# Patient Record
Sex: Female | Born: 1960 | Race: Black or African American | Hispanic: No | Smoking: Former smoker
Health system: Southern US, Community
[De-identification: ages and names within clinical notes are randomized; demographics above are authoritative.]

## PROBLEM LIST (undated history)

## (undated) DIAGNOSIS — Z9221 Personal history of antineoplastic chemotherapy: Secondary | ICD-10-CM

## (undated) DIAGNOSIS — R011 Cardiac murmur, unspecified: Secondary | ICD-10-CM

## (undated) DIAGNOSIS — M1990 Unspecified osteoarthritis, unspecified site: Secondary | ICD-10-CM

## (undated) DIAGNOSIS — R0600 Dyspnea, unspecified: Secondary | ICD-10-CM

## (undated) DIAGNOSIS — C50919 Malignant neoplasm of unspecified site of unspecified female breast: Secondary | ICD-10-CM

## (undated) DIAGNOSIS — G43909 Migraine, unspecified, not intractable, without status migrainosus: Secondary | ICD-10-CM

## (undated) DIAGNOSIS — Z923 Personal history of irradiation: Secondary | ICD-10-CM

## (undated) DIAGNOSIS — D649 Anemia, unspecified: Secondary | ICD-10-CM

## (undated) DIAGNOSIS — I10 Essential (primary) hypertension: Secondary | ICD-10-CM

## (undated) DIAGNOSIS — K219 Gastro-esophageal reflux disease without esophagitis: Secondary | ICD-10-CM

## (undated) DIAGNOSIS — S62509A Fracture of unspecified phalanx of unspecified thumb, initial encounter for closed fracture: Secondary | ICD-10-CM

## (undated) HISTORY — PX: PILONIDAL CYST EXCISION: SHX744

---

## 2004-07-21 HISTORY — PX: ABLATION: SHX5711

## 2006-03-19 ENCOUNTER — Ambulatory Visit: Payer: Self-pay

## 2007-04-15 ENCOUNTER — Ambulatory Visit: Payer: Self-pay | Admitting: Internal Medicine

## 2007-07-28 ENCOUNTER — Ambulatory Visit: Payer: Self-pay

## 2008-08-08 ENCOUNTER — Ambulatory Visit: Payer: Self-pay | Admitting: Family Medicine

## 2009-04-12 ENCOUNTER — Ambulatory Visit: Payer: Self-pay | Admitting: Family Medicine

## 2009-10-03 ENCOUNTER — Ambulatory Visit: Payer: Self-pay | Admitting: Family Medicine

## 2009-10-24 ENCOUNTER — Ambulatory Visit: Payer: Self-pay | Admitting: Family Medicine

## 2010-05-24 ENCOUNTER — Ambulatory Visit: Payer: Self-pay | Admitting: Oncology

## 2010-06-06 ENCOUNTER — Ambulatory Visit: Payer: Self-pay | Admitting: Oncology

## 2010-06-20 ENCOUNTER — Ambulatory Visit: Payer: Self-pay | Admitting: Oncology

## 2010-07-21 ENCOUNTER — Ambulatory Visit: Payer: Self-pay | Admitting: Oncology

## 2010-09-16 ENCOUNTER — Ambulatory Visit: Payer: Self-pay | Admitting: Family Medicine

## 2011-07-22 HISTORY — PX: FRACTURE SURGERY: SHX138

## 2013-12-15 DIAGNOSIS — I10 Essential (primary) hypertension: Secondary | ICD-10-CM | POA: Insufficient documentation

## 2013-12-15 DIAGNOSIS — F32A Depression, unspecified: Secondary | ICD-10-CM | POA: Insufficient documentation

## 2013-12-15 DIAGNOSIS — G43909 Migraine, unspecified, not intractable, without status migrainosus: Secondary | ICD-10-CM | POA: Insufficient documentation

## 2013-12-15 DIAGNOSIS — F329 Major depressive disorder, single episode, unspecified: Secondary | ICD-10-CM | POA: Insufficient documentation

## 2013-12-29 ENCOUNTER — Ambulatory Visit: Payer: Self-pay | Admitting: Family Medicine

## 2014-11-28 ENCOUNTER — Other Ambulatory Visit: Payer: Self-pay | Admitting: Family Medicine

## 2014-11-28 ENCOUNTER — Ambulatory Visit
Admission: RE | Admit: 2014-11-28 | Discharge: 2014-11-28 | Disposition: A | Discharge status: Home / Self Care | Payer: BLUE CROSS/BLUE SHIELD | Source: Ambulatory Visit | Attending: Family Medicine | Admitting: Family Medicine

## 2014-11-28 DIAGNOSIS — R1033 Periumbilical pain: Secondary | ICD-10-CM | POA: Diagnosis present

## 2014-11-28 HISTORY — DX: Essential (primary) hypertension: I10

## 2014-11-28 MED ORDER — IOHEXOL 350 MG/ML SOLN
100.0000 mL | Freq: Once | INTRAVENOUS | Status: AC | PRN
  Administered 2014-11-28: 100 mL via INTRAVENOUS

## 2016-05-04 ENCOUNTER — Emergency Department: Payer: BLUE CROSS/BLUE SHIELD

## 2016-05-04 ENCOUNTER — Emergency Department
Admission: EM | Admit: 2016-05-04 | Discharge: 2016-05-04 | Disposition: A | Discharge status: Home / Self Care | Payer: BLUE CROSS/BLUE SHIELD | Attending: Emergency Medicine | Admitting: Emergency Medicine

## 2016-05-04 DIAGNOSIS — I10 Essential (primary) hypertension: Secondary | ICD-10-CM | POA: Insufficient documentation

## 2016-05-04 DIAGNOSIS — R0789 Other chest pain: Secondary | ICD-10-CM | POA: Diagnosis present

## 2016-05-04 DIAGNOSIS — R5383 Other fatigue: Secondary | ICD-10-CM | POA: Diagnosis not present

## 2016-05-04 DIAGNOSIS — R079 Chest pain, unspecified: Secondary | ICD-10-CM

## 2016-05-04 DIAGNOSIS — F17200 Nicotine dependence, unspecified, uncomplicated: Secondary | ICD-10-CM | POA: Insufficient documentation

## 2016-05-04 HISTORY — DX: Migraine, unspecified, not intractable, without status migrainosus: G43.909

## 2016-05-04 LAB — BASIC METABOLIC PANEL
Anion gap: 7 (ref 5–15)
BUN: 11 mg/dL (ref 6–20)
CALCIUM: 9.4 mg/dL (ref 8.9–10.3)
CHLORIDE: 107 mmol/L (ref 101–111)
CO2: 27 mmol/L (ref 22–32)
CREATININE: 0.8 mg/dL (ref 0.44–1.00)
GFR calc Af Amer: >60 mL/min (ref >60)
Glucose, Bld: 102 mg/dL — ABNORMAL HIGH (ref 65–99)
Potassium: 3.7 mmol/L (ref 3.5–5.1)
SODIUM: 141 mmol/L (ref 135–145)

## 2016-05-04 LAB — CBC
HCT: 40.7 % (ref 35.0–47.0)
Hemoglobin: 13.6 g/dL (ref 12.0–16.0)
MCH: 23.2 pg — ABNORMAL LOW (ref 26.0–34.0)
MCHC: 33.5 g/dL (ref 32.0–36.0)
MCV: 69.2 fL — AB (ref 80.0–100.0)
PLATELETS: 269 10*3/uL (ref 150–440)
RBC: 5.89 MIL/uL — ABNORMAL HIGH (ref 3.80–5.20)
RDW: 15.5 % — AB (ref 11.5–14.5)
WBC: 13.2 10*3/uL — AB (ref 3.6–11.0)

## 2016-05-04 LAB — TROPONIN I: Troponin I: <0.03 ng/mL (ref <0.03)

## 2016-05-04 MED ORDER — ASPIRIN 81 MG PO CHEW
324.0000 mg | CHEWABLE_TABLET | Freq: Once | ORAL | Status: AC
  Administered 2016-05-04: 324 mg via ORAL
  Filled 2016-05-04: qty 4

## 2016-05-04 NOTE — Discharge Instructions (Signed)

## 2016-05-04 NOTE — ED Notes (Signed)
This RN to bedside to apologize for delay, pt and son at bedside at this time, state understanding. NAD noted. Explained to patient this RN would be in to D/C as soon as possible. Pt comfortable and cooperative at this time.

## 2016-05-04 NOTE — ED Provider Notes (Addendum)
[facility] Emergency Department Provider Note  ____________________________________________   First MD Initiated Contact with Patient 05/04/16 1724     (approximate)  I have reviewed the triage vital signs and the nursing notes.  HISTORY  Chief Complaint Chest Pain  HPI Destiny Rogers is a 55 y.o. female reports she hasn't been feeling very well for the last 4 days. She has been feeling just very tired, fatigued, and reports for the last few weeks she is occasionally felt a "catch" feeling in her chest. At times while at rest or sometimes with walking she'll feel like someone is giving her a hug from behind. She describes it as a mild sense of pressure, no severe pain, no radiation to the arm or jaw, not associated with nausea vomiting or sweats.  He does not take any medication to relieve this. She has a history of high blood pressure and is taking blood pressure medicine. She denies any known history of heart disease. She is a smoker. She states there is no early heart attacks or family, though her mother had palpitations and an arrhythmia, believes this developed later in life.  Denies any personal history of heart disease. Reports at present she feels well.  Past Medical History:  Diagnosis Date  . Hypertension   . Migraines     There are no active problems to display for this patient.   Past Surgical History:  Procedure Laterality Date  . CESAREAN SECTION    . FRACTURE SURGERY     left hand    Prior to Admission medications   Not on File    Allergies Penicillins  No family history on file.  Social History Social History  Substance Use Topics  . Smoking status: Current Every Day Smoker  . Smokeless tobacco: Never Used  . Alcohol use No    Review of Systems Constitutional: No fever/chills. Feeling just slightly more tired than usual. Eyes: No visual changes. ENT: No sore throat. Cardiovascular: See history of present  illness Respiratory: Denies shortness of breath. Gastrointestinal: No abdominal pain.  No nausea, no vomiting.  No diarrhea.  No constipation. Genitourinary: Negative for dysuria. No pain or burning. No abnormal odor Musculoskeletal: Negative for back pain. Skin: Negative for rash. Neurological: Negative for headaches, focal weakness or numbness.  10-point ROS otherwise negative.  ____________________________________________   PHYSICAL EXAM:  VITAL SIGNS: ED Triage Vitals  Enc Vitals Group     BP 05/04/16 1544 (!) 155/88     Pulse Rate 05/04/16 1544 77     Resp 05/04/16 1544 18     Temp 05/04/16 1544 98.2 F (36.8 C)     Temp Source 05/04/16 1544 Oral     SpO2 05/04/16 1544 96 %     Weight 05/04/16 1541 183 lb (83 kg)     Height 05/04/16 1541 5\' 4"  (1.626 m)     Head Circumference --      Peak Flow --      Pain Score 05/04/16 1541 7     Pain Loc --      Pain Edu? --      Excl. in [place]? --     Constitutional: Alert and oriented. Well appearing and in no acute distress. Eyes: Conjunctivae are normal. PERRL. EOMI. Head: Atraumatic. Nose: No congestion/rhinnorhea. Mouth/Throat: Mucous membranes are moist.  Oropharynx non-erythematous. Neck: No stridor. Cardiovascular: Normal rate, regular rhythm. Grossly normal heart sounds.  Good peripheral circulation. Respiratory: Normal respiratory effort.  No retractions. Lungs  CTAB. Gastrointestinal: Soft and nontender. No distention.  Musculoskeletal: No lower extremity tenderness nor edema.  Neurologic:  Normal speech and language. No gross focal neurologic deficits are appreciated. No gait instability. Skin:  Skin is warm, dry and intact. No rash noted. Psychiatric: Mood and affect are normal. Speech and behavior are normal.  ____________________________________________   LABS (all labs ordered are listed, but only abnormal results are displayed)  Labs Reviewed  BASIC METABOLIC PANEL - Abnormal; Notable for the following:        Result Value   Glucose, Bld 102 (*)    All other components within normal limits  CBC - Abnormal; Notable for the following:    WBC 13.2 (*)    RBC 5.89 (*)    MCV 69.2 (*)    MCH 23.2 (*)    RDW 15.5 (*)    All other components within normal limits  TROPONIN I  TROPONIN I   ____________________________________________  EKG  ED ECG REPORT I, Rubyann Lingle, the attending physician, personally viewed and interpreted this ECG.  Date: 05/04/2016 EKG Time: 1550 Rate: 90 Rhythm: normal sinus rhythm QRS Axis: normal Intervals: normal ST/T Wave abnormalities: normal Conduction Disturbances: none Narrative Interpretation: unremarkable. No evidence of ischemic abdomen noted.  ____________________________________________  RADIOLOGY  Dg Chest 2 View  Result Date: 05/04/2016 CLINICAL DATA:  PT STATED THAT SHE HAS FELT ICKY AND JUST NOT RIGHT SINCE Thursday. CHEST PAIN AND SOB STARTED TODAY. PT STATED THAT IT FEELS LIKE SOMEONE IS HUGGING HER AND IS SQUEEZING HER FROM BEHIND. NO HX OF HEAR OR LUNG ISSUES. EXAM: CHEST  2 VIEW COMPARISON:  None. FINDINGS: Cardiomediastinal silhouette is normal in size and configuration. Lungs are clear. Lung volumes are normal. No evidence of pneumonia. No pleural effusion. No pneumothorax. Osseous and soft tissue structures about the chest are unremarkable. IMPRESSION: No active cardiopulmonary disease. No evidence of pneumonia or pulmonary edema. Electronically Signed   By: Franki Cabot M.D.   On: 05/04/2016 16:10    ____________________________________________   PROCEDURES  Procedure(s) performed: None  Procedures  Critical Care performed: No  ____________________________________________   INITIAL IMPRESSION / ASSESSMENT AND PLAN / ED COURSE  Pertinent labs & imaging results that were available during my care of the patient were reviewed by me and considered in my medical decision making (see chart for details).  Patient history  evaluation of feeling slightly fatigued the last few days. No focal symptoms of infection. Denies any urinary symptoms. No cough shortness of breath or fever. Does have a very minimal leukocytosis. No rash headache or other symptoms. She does report a slight feeling of discomfort across her chest that has come off and on for a few weeks. Atypical in nature, that does have a slight pressure sensation to it. She denies that has a significant exertional component.  There is no pleuritic chest pain. No leg swelling. No significant risk factors for DVT or pulmonary embolism. She is not hypoxic, normal heart rate, and she denies any dyspnea. No ripping tearing or moving pain.  EKG no ischemia, 2 negative troponins. Discussed case with Dr. Stanford Breed of [facility] cardiology he recommends follow-up with Dr. Rockey Situ. Discussed with the patient close follow-up and chest pain return precautions for which she is agreeable. Patient's heart score is low risk.  Also discussed her white blood cell count is slightly elevated, given her associated fatigue perhaps this could be early evidence of a viral syndrome oncoming or other infection. I discussed with if she develops a  fever, increased chest pain, severe pain, worsening fatigue and weakness or other concerns that she should come back to the ER right away. She is agreeable. She is also agreeable to following up the next couple of days with Dr. Donivan Scull office and chest pain follow-up as recommended by Dr. Stanford Breed.  Return precautions and treatment recommendations and follow-up discussed with the patient who is agreeable with the plan.  ----------------------------------------- 7:35 PM on 05/04/2016 -----------------------------------------  Note the patient reports currently asymptomatic. No chest pain.   Clinical Course     ____________________________________________   FINAL CLINICAL IMPRESSION(S) / ED DIAGNOSES  Final diagnoses:  Chest pain with low risk  for cardiac etiology  Fatigue, unspecified type      NEW MEDICATIONS STARTED DURING THIS VISIT:  New Prescriptions   No medications on file     Note:  This document was prepared using Dragon voice recognition software and may include unintentional dictation errors.     Delman Kitten, MD 05/04/16 1935  A staff message was sent to Dr. Rockey Situ to assure follow-up scheduling    Delman Kitten, MD 05/04/16 [phone]

## 2016-05-04 NOTE — ED Triage Notes (Signed)
Pt c/o pain radiating into the neck and chest tightness like she has a band around her chest since Thursday.the patient is in NAD.Marland Kitchen

## 2016-05-04 NOTE — ED Notes (Signed)
NAD noted at time of D/C. Pt denies questions or concerns. Pt ambulatory to the lobby at this time.  

## 2016-05-06 ENCOUNTER — Ambulatory Visit: Payer: BLUE CROSS/BLUE SHIELD | Admitting: Internal Medicine

## 2016-06-10 ENCOUNTER — Ambulatory Visit: Payer: BLUE CROSS/BLUE SHIELD | Admitting: Internal Medicine

## 2016-07-21 DIAGNOSIS — C50919 Malignant neoplasm of unspecified site of unspecified female breast: Secondary | ICD-10-CM

## 2016-07-21 DIAGNOSIS — Z9221 Personal history of antineoplastic chemotherapy: Secondary | ICD-10-CM

## 2016-07-21 HISTORY — DX: Personal history of antineoplastic chemotherapy: Z92.21

## 2016-07-21 HISTORY — DX: Malignant neoplasm of unspecified site of unspecified female breast: C50.919

## 2016-07-29 ENCOUNTER — Other Ambulatory Visit: Payer: Self-pay | Admitting: Family Medicine

## 2016-07-29 DIAGNOSIS — Z1231 Encounter for screening mammogram for malignant neoplasm of breast: Secondary | ICD-10-CM

## 2016-08-26 ENCOUNTER — Ambulatory Visit: Payer: BLUE CROSS/BLUE SHIELD

## 2016-09-23 ENCOUNTER — Ambulatory Visit: Payer: BLUE CROSS/BLUE SHIELD | Attending: Family Medicine

## 2017-02-26 ENCOUNTER — Other Ambulatory Visit: Payer: Self-pay | Admitting: Family Medicine

## 2017-02-26 DIAGNOSIS — N6310 Unspecified lump in the right breast, unspecified quadrant: Principal | ICD-10-CM

## 2017-02-26 DIAGNOSIS — N6315 Unspecified lump in the right breast, overlapping quadrants: Secondary | ICD-10-CM

## 2017-03-09 ENCOUNTER — Ambulatory Visit
Admission: RE | Admit: 2017-03-09 | Discharge: 2017-03-09 | Disposition: A | Discharge status: Home / Self Care | Payer: BLUE CROSS/BLUE SHIELD | Source: Ambulatory Visit | Attending: Family Medicine | Admitting: Family Medicine

## 2017-03-09 DIAGNOSIS — N6311 Unspecified lump in the right breast, upper outer quadrant: Secondary | ICD-10-CM | POA: Diagnosis present

## 2017-03-09 DIAGNOSIS — N6315 Unspecified lump in the right breast, overlapping quadrants: Secondary | ICD-10-CM

## 2017-03-09 DIAGNOSIS — N6310 Unspecified lump in the right breast, unspecified quadrant: Principal | ICD-10-CM

## 2017-03-10 ENCOUNTER — Other Ambulatory Visit: Payer: Self-pay | Admitting: Family Medicine

## 2017-03-10 DIAGNOSIS — N6310 Unspecified lump in the right breast, unspecified quadrant: Secondary | ICD-10-CM

## 2017-03-10 DIAGNOSIS — R928 Other abnormal and inconclusive findings on diagnostic imaging of breast: Secondary | ICD-10-CM

## 2017-03-19 ENCOUNTER — Ambulatory Visit
Admission: RE | Admit: 2017-03-19 | Discharge: 2017-03-19 | Disposition: A | Discharge status: Home / Self Care | Payer: BLUE CROSS/BLUE SHIELD | Source: Ambulatory Visit | Attending: Family Medicine | Admitting: Family Medicine

## 2017-03-19 DIAGNOSIS — C50411 Malignant neoplasm of upper-outer quadrant of right female breast: Secondary | ICD-10-CM | POA: Insufficient documentation

## 2017-03-19 DIAGNOSIS — N6311 Unspecified lump in the right breast, upper outer quadrant: Secondary | ICD-10-CM | POA: Diagnosis not present

## 2017-03-19 DIAGNOSIS — R928 Other abnormal and inconclusive findings on diagnostic imaging of breast: Secondary | ICD-10-CM

## 2017-03-19 DIAGNOSIS — N6310 Unspecified lump in the right breast, unspecified quadrant: Secondary | ICD-10-CM

## 2017-03-19 HISTORY — PX: BREAST BIOPSY: SHX20

## 2017-03-24 ENCOUNTER — Encounter: Payer: Self-pay | Admitting: *Deleted

## 2017-03-24 NOTE — Progress Notes (Signed)
  Oncology Nurse Navigator Documentation  Navigator Location: [facility] (03/24/17 1500)   )Navigator Encounter Type: Telephone;Introductory phone call (03/24/17 1500) Telephone: [place] Call (03/24/17 1500) Abnormal Finding Date: 03/09/17 (03/24/17 1500) Confirmed Diagnosis Date: 03/20/17 (03/24/17 1500)                   Barriers/Navigation Needs: Education;Coordination of Care (03/24/17 1500) Education: Newly Diagnosed Cancer Education;Understanding Cancer/ Treatment Options (03/24/17 1500) Interventions: Coordination of Care (03/24/17 1500)   Coordination of Care: Appts (03/24/17 1500)                  Time Spent with Patient: 45 (03/24/17 1500)   Called patient to introduce to navigator services.  Patient was notified this morning by radiology of her biopsy results.  I have scheduled her to see Dr. Tamala Julian on 03/26/17 @ 9:30.  She is to arrive at 9:15 to complete paperwork.  Will take educational literature for patient to pick up at his office on Thursday.  Have requested medical oncology appointment.  I will call her back as soon as I have that for her.  She is to call if she has any questions or needs.

## 2017-03-25 ENCOUNTER — Encounter: Payer: Self-pay | Admitting: *Deleted

## 2017-03-25 NOTE — Progress Notes (Signed)
  Oncology Nurse Navigator Documentation  Navigator Location: [facility] (03/25/17 1100)   )Navigator Encounter Type: Telephone (03/25/17 1100) Telephone: [place] Call (03/25/17 1100)                                                  Time Spent with Patient: 15 (03/25/17 1100)   Tried to call patient with her appointment for medical oncology.  I have her scheduled to see Dr. Grayland Ormond on Tuesday, 03/31/17 @ 10:30.  Left patient a message to return my call.

## 2017-03-26 ENCOUNTER — Other Ambulatory Visit: Payer: Self-pay

## 2017-03-27 ENCOUNTER — Encounter: Payer: Self-pay | Admitting: *Deleted

## 2017-03-27 DIAGNOSIS — C50411 Malignant neoplasm of upper-outer quadrant of right female breast: Secondary | ICD-10-CM | POA: Insufficient documentation

## 2017-03-27 LAB — SURGICAL PATHOLOGY

## 2017-03-27 NOTE — Progress Notes (Signed)
  Oncology Nurse Navigator Documentation  Navigator Location: [facility] (03/27/17 1500)   )Navigator Encounter Type: Telephone (03/27/17 1500) Telephone: [place] Call (03/27/17 1500)                           Interventions: Coordination of Care (03/27/17 1500)   Coordination of Care: Appts (03/27/17 1500)                  Time Spent with Patient: 30 (03/27/17 1500)   Called patient today to confirm that she was aware of her appointment with Dr. Grayland Ormond on Tuesday at 10:30.  States she did receive her educational literature I sent to be given at her surgical consult with Dr. Tamala Julian.  She is to call if she has any questions or needs.

## 2017-03-27 NOTE — Progress Notes (Signed)
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  INTERVAL HISTORY: Patient is a 56 year old female who noted a lump in her right breast on self-examination. Subsequent mammogram, ultrasound, biopsy revealed the above stated breast cancer. She is anxious, but otherwise feels well. She has no neurologic complaints. She denies any pain. She has a good appetite and denies weight loss. She has no chest pain or shortness of breath. She denies any nausea, vomiting, constipation, or diarrhea. She has no urinary complaints. Patient offers no further specific complaints today.  REVIEW OF SYSTEMS:   Review of Systems  Constitutional: Negative.  Negative for fever, malaise/fatigue and weight loss.  Respiratory: Negative.  Negative for cough and shortness of breath.   Cardiovascular: Negative.  Negative for chest pain and leg swelling.  Gastrointestinal: Negative.  Negative for abdominal pain.  Genitourinary: Negative.   Musculoskeletal: Negative.   Skin: Negative for rash.  Neurological: Negative.  Negative for sensory change and weakness.  Psychiatric/Behavioral: The patient is nervous/anxious.     As per HPI. Otherwise, a complete review of systems is negative.  PAST MEDICAL HISTORY: Past Medical History:  Diagnosis Date  . Hypertension   . Migraines     PAST SURGICAL HISTORY: Past Surgical History:  Procedure Laterality Date  . BREAST BIOPSY Right 03/19/2017   US guided biopsy of right breast and right lymph node  . CESAREAN SECTION    . FRACTURE SURGERY     left hand    FAMILY HISTORY: Family History  Problem Relation Age of Onset  . Breast cancer Maternal Grandmother   .  Hypertension Maternal Grandmother   . Angina Mother   . Lung cancer Father   . Diabetes Father   . Prostate cancer Maternal Uncle   . Dementia Maternal Grandfather   . Congestive Heart Failure Maternal Grandfather   . Heart disease Paternal Grandmother     ADVANCED DIRECTIVES (Y/N):  N  HEALTH MAINTENANCE: Social History  Substance Use Topics  . Smoking status: Current Every Day Smoker  . Smokeless tobacco: Never Used  . Alcohol use No     Colonoscopy:  PAP:  Bone density:  Lipid panel:  Allergies  Allergen Reactions  . Penicillins Swelling    Throat swelling  . Hydrocodone-Acetaminophen Rash    Current Outpatient Prescriptions  Medication Sig Dispense Refill  . Acetaminophen (TYLENOL ARTHRITIS PAIN PO) Take by mouth.    . bisoprolol-hydrochlorothiazide (ZIAC) 5-6.25 MG tablet TAKE 1 TABLET BY MOUTH ONCE DAILY.     No current facility-administered medications for this visit.     OBJECTIVE: Vitals:   03/31/17 1054  BP: (!) 144/81  Resp: 18  Temp: 98.5 F (36.9 C)     Body mass index is 32.13 kg/m.    ECOG FS:0 - Asymptomatic  General: Well-developed, well-nourished, no acute distress. Eyes: Pink conjunctiva, anicteric sclera. HEENT: Normocephalic, moist mucous membranes, clear oropharnyx. Breasts: Patient requested exam be deferred today. Lungs: Clear to auscultation bilaterally. Heart: Regular rate and rhythm. No rubs, murmurs, or gallops. Abdomen: Soft, nontender, nondistended. No organomegaly noted, normoactive bowel sounds. Musculoskeletal: No edema, cyanosis, or clubbing. Neuro: Alert, answering all questions appropriately. Cranial nerves grossly intact. Skin: No rashes or petechiae noted. Psych: Normal affect. Lymphatics:  No cervical, calvicular, axillary or inguinal LAD.   LAB RESULTS:  Lab Results  Component Value Date   NA 141 05/04/2016   K 3.7 05/04/2016   CL 107 05/04/2016   CO2 27 05/04/2016   GLUCOSE 102 (H) 05/04/2016   BUN 11  05/04/2016   CREATININE 0.80 05/04/2016   CALCIUM 9.4 05/04/2016   GFRNONAA >60 05/04/2016   GFRAA >60 05/04/2016    Lab Results  Component Value Date   WBC 13.2 (H) 05/04/2016   HGB 13.6 05/04/2016   HCT 40.7 05/04/2016   MCV 69.2 (L) 05/04/2016   PLT 269 05/04/2016     STUDIES: US Breast Ltd Uni Right Inc Axilla  Result Date: 03/09/2017 CLINICAL DATA:  Right breast upper outer quadrant area of palpable concern felt clinically. EXAM: 2D DIGITAL DIAGNOSTIC BILATERAL MAMMOGRAM WITH CAD AND ADJUNCT TOMO ULTRASOUND RIGHT BREAST COMPARISON:  Previous exam(s). ACR Breast Density Category c: The breast tissue is heterogeneously dense, which may obscure small masses. FINDINGS: Mammographically, there are no suspicious masses, areas of architectural distortion or microcalcifications in the left breast. There is an ill-defined spiculated mass in the right breast upper outer quadrant, posterior depth, which corresponds to the area of palpable concern. Mammographic images were processed with CAD. On physical exam, palpable moderately firm approximately 1.5 cm nodule in the right breast upper outer quadrant, posterior depth. Targeted ultrasound is performed, showing right breast 930 o'clock 6 cm from the nipple hypoechoic irregular mass with associated hypervascularity and posterior acoustic shadowing. It measures 1.4 x 1.1 x 0.7 cm. This finding corresponds to the mammographically seen mass. There are 2 lymph nodes in the right axilla with mildly increased cortical thickening measuring 3 mm. IMPRESSION: Suspicious right breast 930 o'clock mass. Two indeterminate right axillary lymph nodes. RECOMMENDATION: Ultrasound-guided core needle biopsy of right breast 930 o'clock mass and 1 of the right axillary lymph nodes with thickened cortex. I have discussed the findings and recommendations with the patient. Results were also provided in writing at the conclusion of the visit. If applicable, a reminder letter  will be sent to the patient regarding the next appointment. BI-RADS CATEGORY  4: Suspicious. Electronically Signed   By: Fidela Salisbury M.D.   On: 03/09/2017 15:15   Mm Diag Breast Tomo Bilateral  Result Date: 03/09/2017 CLINICAL DATA:  Right breast upper outer quadrant area of palpable concern felt clinically. EXAM: 2D DIGITAL DIAGNOSTIC BILATERAL MAMMOGRAM WITH CAD AND ADJUNCT TOMO ULTRASOUND RIGHT BREAST COMPARISON:  Previous exam(s). ACR Breast Density Category c: The breast tissue is heterogeneously dense, which may obscure small masses. FINDINGS: Mammographically, there are no suspicious masses, areas of architectural distortion or microcalcifications in the left breast. There is an ill-defined spiculated mass in the right breast upper outer quadrant, posterior depth, which corresponds to the area of palpable concern. Mammographic images were processed with CAD. On physical exam, palpable moderately firm approximately 1.5 cm nodule in the right breast upper outer quadrant, posterior depth. Targeted ultrasound is performed, showing right breast 930 o'clock 6 cm from the nipple hypoechoic irregular mass with associated hypervascularity and posterior acoustic shadowing. It measures 1.4 x 1.1 x 0.7 cm. This finding corresponds to the mammographically seen mass. There are 2 lymph nodes in the right axilla with mildly increased cortical thickening measuring 3 mm. IMPRESSION: Suspicious right breast 930 o'clock mass. Two indeterminate right axillary lymph nodes. RECOMMENDATION: Ultrasound-guided core needle biopsy of right breast 930 o'clock mass and 1 of the right axillary lymph nodes with thickened cortex.  I have discussed the findings and recommendations with the patient. Results were also provided in writing at the conclusion of the visit. If applicable, a reminder letter will be sent to the patient regarding the next appointment. BI-RADS CATEGORY  4: Suspicious. Electronically Signed   By: Fidela Salisbury M.D.   On: 03/09/2017 15:15   Mm Clip Placement Right  Result Date: 03/19/2017 CLINICAL DATA:  Status post ultrasound-guided biopsy of a right breast mass at 9:30, 6 cm from the nipple and of a right axillary lymph node EXAM: DIAGNOSTIC RIGHT MAMMOGRAM POST ULTRASOUND BIOPSY COMPARISON:  Previous exam(s). FINDINGS: Mammographic images were obtained following ultrasound guided biopsy of a right breast mass at 9:30, 6 cm from the nipple and of a right axillary lymph node. Post biopsy mammogram demonstrates the coil shaped biopsy marker to be within the mass in the upper, outer right breast and the [facility] shape 3 coil marker to be in the expected location in the right axilla. IMPRESSION: Appropriate marker positions as above. Final Assessment: Post Procedure Mammograms for Marker Placement Electronically Signed   By: Pamelia Hoit M.D.   On: 03/19/2017 09:45   [place] Rt Breast Bx W Loc Dev 1st Lesion Img Bx Spec US Guide  Addendum Date: 03/25/2017   ADDENDUM REPORT: 03/25/2017 10:05 ADDENDUM: Pathology of the right breast biopsy revealed A. RIGHT BREAST, 9:30, 6 CMFN; ULTRASOUND-GUIDED CORE BIOPSY: INVASIVE MAMMARY CARCINOMA WITH APOCRINE FEATURES. Size of invasive carcinoma: 1.3 cm in this sample. Histologic grade of invasive carcinoma: Grade 2. Note: ER/PR/HER2: Immunohistochemistry will be performed with reflex to Swartz for HER2 2+. The results will be reported in an addendum. This was found to be concordant with Dr. Raul Del impression and notes. Recommendation:  Surgical and oncology referrals. At the patient's request, results were relayed to the patient by phone by Dr. Radford Pax on 03/24/17. The patient stated she had some mild soreness at the biopsy site. All of her questions were answered. She was informed that the nurse navigators from [facility] would contact her with referral information. She was encouraged to contact the [facility] with any further questions or  concerns. The patient stated she has an appointment with Dr. Kary Kos on Thursday, 03/26/17 and asked that we confirm that he had the results of the biopsy before her appointment with him. Jetta Lout, RRA [facility]) contacted Dr. Barbarann Ehlers office. They confirmed that they do have the results and the patient does have an appointment on 03/26/17. Results and request for referral were relayed to Tanya Nones, RN, nurse navigator for [facility] by [place], [place]. An appointment was made with Dr. Rochel Brome for 03/26/17 at 9:30 AM by Tanya Nones, RN. The patient is aware of the appointment. Addendum by Jetta Lout, RRA on 03/25/17. Electronically Signed   By: Pamelia Hoit M.D.   On: 03/25/2017 10:05   Result Date: 03/25/2017 CLINICAL DATA:  56 year old female for ultrasound-guided biopsy of a suspicious right breast mass EXAM: ULTRASOUND GUIDED RIGHT BREAST CORE NEEDLE BIOPSY COMPARISON:  Previous exam(s). FINDINGS: I met with the patient and we discussed the procedure of ultrasound-guided biopsy, including benefits and alternatives. We discussed the high likelihood of a successful procedure. We discussed the risks of the procedure, including infection, bleeding, tissue injury, clip migration, and inadequate sampling. Informed written consent was given. The usual time-out protocol was performed immediately prior to the procedure. Lesion quadrant: Upper outer quadrant Using sterile technique and 1% Lidocaine as local anesthetic, under  direct ultrasound visualization, a 12 gauge spring-loaded device was used to perform biopsy of a suspicious right breast mass at 9:30, 6 cm from the nipple using a lateral to medial approach. At the conclusion of the procedure a coil shaped tissue marker clip was deployed into the biopsy cavity. Follow up 2 view mammogram was performed and dictated separately. IMPRESSION: Ultrasound guided biopsy of a suspicious right breast mass at 9:30, 6 cm from the  nipple. No apparent complications. Electronically Signed: By: Pamelia Hoit M.D. On: 03/19/2017 09:56   [place] Rt Breast Bx W Loc Dev Ea Add Lesion Img Bx Spec US Guide  Addendum Date: 03/25/2017   ADDENDUM REPORT: 03/25/2017 10:04 ADDENDUM: Pathology of the right axillary lymph node biopsy revealed B. LYMPH NODE, RIGHT AXILLARY; ULTRASOUND-GUIDED CORE BIOPSY: METASTATIC CARCINOMA OF BREAST ORIGIN. This was found to be concordant with Dr. Raul Del impression and notes. Recommendation:  Surgical and oncology referrals. At the patient's request, results were relayed to the patient by phone by Dr. Radford Pax on 03/24/17. The patient stated she has some mild soreness at the biopsy site. All of her questions were answered. She was encouraged to call the [facility] with any further questions or concerns. She will be contacted by the nurse navigator with referral information. Results and request for referral were relayed to Tanya Nones, RN, nurse navigator for [facility] by [place], [place]. An appointment was made with Dr. Rochel Brome for 03/26/17 at 9:30 AM. The patient has been informed of the appointment. Addendum by Jetta Lout, RRA on 03/25/17. Electronically Signed   By: Pamelia Hoit M.D.   On: 03/25/2017 10:04   Result Date: 03/25/2017 CLINICAL DATA:  56 year old female for ultrasound-guided biopsy of an indeterminate right axillary lymph node EXAM: ULTRASOUND GUIDED CORE NEEDLE BIOPSY OF A RIGHT AXILLARY NODE COMPARISON:  Previous exam(s). FINDINGS: I met with the patient and we discussed the procedure of ultrasound-guided biopsy, including benefits and alternatives. We discussed the high likelihood of a successful procedure. We discussed the risks of the procedure, including infection, bleeding, tissue injury, clip migration, and inadequate sampling. Informed written consent was given. The usual time-out protocol was performed immediately prior to the procedure. Using sterile technique and  1% Lidocaine as local anesthetic, under direct ultrasound visualization, a 14 gauge spring-loaded device was used to perform biopsy of an indeterminate right axillary lymph node using a lateral to medial approach. At the conclusion of the procedure a HydroMARK shape 3 coil tissue marker clip was deployed into the biopsy cavity. Follow up 2 view mammogram was performed and dictated separately. IMPRESSION: Ultrasound guided biopsy of an indeterminate right axillary lymph node. No apparent complications. Electronically Signed: By: Pamelia Hoit M.D. 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  PLAN:    1. 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 After lengthy discussion with the patient, she is highly interested in breast conservation and wants to minimize the amount of surgery. Because of this, we will proceed with neoadjuvant chemotherapy using Adriamycin, Cytoxan, and Taxol with Neulasta support. Once patient completes her chemotherapy, she will require lumpectomy followed by XRT as well as an aromatase inhibitor for 5 years. Prior to initiating treatment patient will require a MUGA scan, port placement, as  well as a CT of the chest, abdomen, and pelvis to complete the staging workup. She has new insurance starting October 1, therefore patient has requested to initiate treatment on April 21, 2017. Return to clinic then to initiate cycle 1 of 4 of dose dense Adriamycin and Cytoxan with Neulasta support.  Approximately 60 minutes was spent in discussion of which greater than 50% was consultation.  Patient expressed understanding and was in agreement with this plan. She also understands that She can call clinic at any time with any  questions, concerns, or complaints.   Cancer Staging Primary cancer of upper outer quadrant of right female breast [facility]) Staging form: Breast, AJCC 8th Edition - Clinical stage from 03/27/2017: Stage IB (cT1c, cN1, cM0, G2, ER: Positive, PR: Positive, HER2: Negative) - Signed by Lloyd Huger, MD on 03/27/2017   Lloyd Huger, MD   04/03/2017 10:27 AM

## 2017-03-31 ENCOUNTER — Inpatient Hospital Stay: Payer: BLUE CROSS/BLUE SHIELD | Attending: Oncology | Admitting: Oncology

## 2017-03-31 ENCOUNTER — Encounter: Payer: Self-pay | Admitting: Oncology

## 2017-03-31 ENCOUNTER — Ambulatory Visit: Payer: BLUE CROSS/BLUE SHIELD | Admitting: Internal Medicine

## 2017-03-31 VITALS — BP 144/81 | Temp 98.5°F | Resp 18 | Wt 187.2 lb

## 2017-03-31 DIAGNOSIS — F419 Anxiety disorder, unspecified: Secondary | ICD-10-CM | POA: Diagnosis not present

## 2017-03-31 DIAGNOSIS — Z170 Estrogen receptor positive status [ER+]: Secondary | ICD-10-CM | POA: Insufficient documentation

## 2017-03-31 DIAGNOSIS — C50411 Malignant neoplasm of upper-outer quadrant of right female breast: Secondary | ICD-10-CM | POA: Diagnosis not present

## 2017-03-31 DIAGNOSIS — Z801 Family history of malignant neoplasm of trachea, bronchus and lung: Secondary | ICD-10-CM | POA: Insufficient documentation

## 2017-03-31 DIAGNOSIS — Z79899 Other long term (current) drug therapy: Secondary | ICD-10-CM | POA: Diagnosis not present

## 2017-03-31 DIAGNOSIS — Z8042 Family history of malignant neoplasm of prostate: Secondary | ICD-10-CM | POA: Diagnosis not present

## 2017-03-31 DIAGNOSIS — Z7189 Other specified counseling: Secondary | ICD-10-CM

## 2017-03-31 DIAGNOSIS — I10 Essential (primary) hypertension: Secondary | ICD-10-CM | POA: Diagnosis not present

## 2017-03-31 DIAGNOSIS — F17210 Nicotine dependence, cigarettes, uncomplicated: Secondary | ICD-10-CM | POA: Diagnosis not present

## 2017-03-31 NOTE — Progress Notes (Signed)
Encounter created in error

## 2017-04-03 DIAGNOSIS — Z7189 Other specified counseling: Secondary | ICD-10-CM | POA: Insufficient documentation

## 2017-04-03 MED ORDER — LIDOCAINE-PRILOCAINE 2.5-2.5 % EX CREA: TOPICAL_CREAM | CUTANEOUS | 3 refills | Status: DC

## 2017-04-03 MED ORDER — PROCHLORPERAZINE MALEATE 10 MG PO TABS: 10.0000 mg | ORAL_TABLET | Freq: Four times a day (QID) | ORAL | 2 refills | Status: DC | PRN

## 2017-04-03 MED ORDER — ONDANSETRON HCL 8 MG PO TABS: 8.0000 mg | ORAL_TABLET | Freq: Two times a day (BID) | ORAL | 2 refills | Status: DC | PRN

## 2017-04-03 NOTE — Progress Notes (Signed)
START ON PATHWAY REGIMEN - Breast   Dose-Dense AC q14 days:   A cycle is every 14 days:     Doxorubicin      Cyclophosphamide      [id number]-[id number]   **Always confirm dose/schedule in your pharmacy ordering system**  Paclitaxel 80 mg/m2 Weekly:   Administer weekly:     Paclitaxel   **Always confirm dose/schedule in your pharmacy ordering system**  Patient Characteristics: Preoperative or Nonsurgical Candidate (Clinical Staging), Neoadjuvant Therapy followed by Surgery, Invasive Disease, Chemotherapy, HER2 Negative/Unknown/Equivocal, ER Positive Therapeutic Status: Preoperative or Nonsurgical Candidate (Clinical Staging) AJCC M Category: cM0 AJCC Grade: G2 Breast Surgical Plan: Neoadjuvant Therapy followed by Surgery ER Status: Positive (+) AJCC 8 Stage Grouping: IB HER2 Status: Negative (-) AJCC T Category: cT1c AJCC N Category: cN1 PR Status: Positive (+) Intent of Therapy: Curative Intent, Discussed with Patient 

## 2017-04-07 ENCOUNTER — Ambulatory Visit: Payer: Self-pay | Admitting: General Surgery

## 2017-04-07 NOTE — H&P (Signed)
HISTORY OF PRESENT ILLNESS:    Destiny Rogers is a 56 y.o.female patient who is known to Dr. Tamala Julian due to a diagnosis of breast Cancer. Patient comes today to the clinic referred by Oncologist for [facility] placement. The patient comes today with her son and her mother for orientation of the procedure and possible coordination. Patient denies any change since last visit. Patient is not complaining of shortness of breath or chest pain. Patient denies taking any blood thinner or anticoagulants. Patient refers is able to move all her extremities without any problem.         PAST MEDICAL HISTORY:      Past Medical History:  Diagnosis Date  . Depression, unspecified   . Hyperlipidemia, unspecified, unspecified   . Hypertension   . Migraine      PAST SURGICAL HISTORY:        Past Surgical History:  Procedure Laterality Date  . CESAREAN SECTION    . DILATION AND CURETTAGE, DIAGNOSTIC / THERAPEUTIC  2008   and uterine ablation - Dr. Laurey Morale  . TUBAL LIGATION       MEDICATIONS:  EncounterMedications        Outpatient Encounter Prescriptions as of 04/07/2017  Medication Sig Dispense Refill  . bisoprolol-hydrochlorothiazide (ZIAC) 5-6.25 mg tablet TAKE 1 TABLET BY MOUTH ONCE DAILY. 30 tablet 11  . buPROPion (WELLBUTRIN SR) 150 MG SR tablet Take 1 tablet (150 mg total) by mouth 2 (two) times daily. 60 tablet 11   No facility-administered encounter medications on file as of 04/07/2017.        ALLERGIES:   Norco [hydrocodone-acetaminophen] and Penicillins.   SOCIAL HISTORY:  Social History        Social History  . Marital status: Single    Spouse name: N/A  . Number of children: N/A  . Years of education: N/A       Social History Main Topics  . Smoking status: Current Every Day Smoker  . Smokeless tobacco: Never Used  . Alcohol use No  . Drug use: Unknown  . Sexual activity: Not Asked       Other Topics Concern  . None      Social History Narrative  .  None  .  FAMILY HISTORY:       Family History  Problem Relation Age of Onset  . High blood pressure (Hypertension) Mother   . Diabetes type II Father   . Lung cancer Father   . Diabetes type II Sister   . Alzheimer's disease Maternal Grandfather    .  GENERAL REVIEW OF SYSTEMS:   PHYSICAL EXAM:     Vitals:   04/07/17 0750  BP: 150/85  Pulse: 88  Temp: 36.4 C (97.6 F)   Ht:165.1 cm (5\' 5" ) Wt:84.8 kg (186 lb 14.4 oz) SEG:BTDV surface area is 1.97 meters squared. Body mass index is 31.1 kg/m.Marland Kitchen    PHYSICAL EXAM: General: alert, active, oriented x3. No distress. A little nervous Heart: regular rate Breast: Right breast with adequate wound healing. No skin changes. Normal bilateral nipples. Right breast right outer quadrant fullness palpated. Normal left breast.  Abdomen: soft and depressible, non tender, non distended.  Extremities. No edema, no cyanosis.     IMPRESSION AND PLAN:     Patient with known diagnosis of breast cancer of the right breast. The patient was oriented about the procedure steps, the benefits of having a Chemo port and the risk. Patient is going to start chemotherapy on April 21, 2017.  The alternative to receive the chemotherapy through a peripheral vein and wait until Dr. Tamala Julian is available to perform the procedure was given to the patient but the patient is aware of the benefits of having the port and of the risks of receiving the chemo through a peripheral veins. All questions were answered to the patient and her mother. Will proceed to place the [facility].   I was 35 minutes orienting the patient about the procedure, the day of surgery, how to care the wound and the port area, answering all the questions to the patient and family.   Patient and family members verbalized understanding, all questions were answered, and were agreeable with the plan outlined above.   Herbert Pun, MD  Electronically signed by Herbert Pun,  MD

## 2017-04-09 ENCOUNTER — Encounter
Admission: RE | Admit: 2017-04-09 | Discharge: 2017-04-09 | Disposition: A | Discharge status: Home / Self Care | Payer: BLUE CROSS/BLUE SHIELD | Source: Ambulatory Visit | Attending: General Surgery | Admitting: General Surgery

## 2017-04-09 HISTORY — DX: Gastro-esophageal reflux disease without esophagitis: K21.9

## 2017-04-09 HISTORY — DX: Unspecified osteoarthritis, unspecified site: M19.90

## 2017-04-09 HISTORY — DX: Cardiac murmur, unspecified: R01.1

## 2017-04-09 NOTE — Patient Instructions (Signed)
  Your procedure is scheduled on: 04-15-17 [facility] Report to Same Day Surgery 2nd floor medical mall [facility] Entrance-take elevator on left to 2nd floor.  Check in with surgery information desk.) To find out your arrival time please call [phone] between 1PM - 3PM on 04-14-17 TUESDAY  Remember: Instructions that are not followed completely may result in serious medical risk, up to and including death, or upon the discretion of your surgeon and anesthesiologist your surgery may need to be rescheduled.    _x___ 1. Do not eat food after midnight the night before your procedure. NO CHEWING GUM OR HARD CANDIES.   You may drink clear liquids up to 2 hours before you are scheduled to arrive at the hospital for your procedure.  Do not drink clear liquids within 2 hours of your scheduled arrival to the hospital.  Clear liquids include  --Water or Apple juice without pulp  --Clear carbohydrate beverage such as ClearFast or Gatorade  --Black Coffee or Clear Tea (No milk, no creamers, do not add anything to the coffee or Tea)  Type 1 and type 2 diabetics should only drink water.    __x__ 2. No Alcohol for 24 hours before or after surgery.   __x__3. No Smoking for 24 prior to surgery.   ____  4. Bring all medications with you on the day of surgery if instructed.    __x__ 5. Notify your doctor if there is any change in your medical condition     (cold, fever, infections).     Do not wear jewelry, make-up, hairpins, clips or nail polish.  Do not wear lotions, powders, or perfumes. You may wear deodorant.  Do not shave 48 hours prior to surgery. Men may shave face and neck.  Do not bring valuables to the hospital.    [facility] is not responsible for any belongings or valuables.               Contacts, dentures or bridgework may not be worn into surgery.  Leave your suitcase in the car. After surgery it may be brought to your room.  For patients admitted to the hospital, discharge time  is determined by your treatment team.   Patients discharged the day of surgery will not be allowed to drive home.  You will need someone to drive you home and stay with you the night of your procedure.    Please read over the following fact sheets that you were given:      ____ Take anti-hypertensive listed below, cardiac, seizure, asthma, anti-reflux and psychiatric medicines. These include:  1. NONE  2.  3.  4.  5.  6.  ____Fleets enema or Magnesium Citrate as directed.   _x___ Use CHG Soap or sage wipes as directed on instruction sheet   ____ Use inhalers on the day of surgery and bring to hospital day of surgery  ____ Stop Metformin and Janumet 2 days prior to surgery.    ____ Take 1/2 of usual insulin dose the night before surgery and none on the morning surgery.   ____ Follow recommendations from Cardiologist, Pulmonologist or PCP regarding stopping Aspirin, Coumadin, Plavix ,Eliquis, Effient, or Pradaxa, and Pletal.  X____Stop Anti-inflammatories such as Advil, Aleve, Ibuprofen, Motrin, Naproxen, Naprosyn, Goodies powders or aspirin products NOW-OK to take Tylenol    ____ Stop supplements until after surgery    ____ Bring C-Pap to the hospital.

## 2017-04-10 ENCOUNTER — Ambulatory Visit: Payer: BLUE CROSS/BLUE SHIELD

## 2017-04-13 ENCOUNTER — Encounter
Admission: RE | Admit: 2017-04-13 | Discharge: 2017-04-13 | Disposition: A | Discharge status: Home / Self Care | Payer: BLUE CROSS/BLUE SHIELD | Source: Ambulatory Visit | Attending: Oncology | Admitting: Oncology

## 2017-04-13 ENCOUNTER — Encounter
Admission: RE | Admit: 2017-04-13 | Discharge: 2017-04-13 | Disposition: A | Discharge status: Home / Self Care | Payer: BLUE CROSS/BLUE SHIELD | Source: Ambulatory Visit | Attending: *Deleted | Admitting: *Deleted

## 2017-04-13 ENCOUNTER — Ambulatory Visit
Admission: RE | Admit: 2017-04-13 | Discharge: 2017-04-13 | Disposition: A | Discharge status: Home / Self Care | Payer: BLUE CROSS/BLUE SHIELD | Source: Ambulatory Visit | Attending: Oncology | Admitting: Oncology

## 2017-04-13 DIAGNOSIS — C50411 Malignant neoplasm of upper-outer quadrant of right female breast: Secondary | ICD-10-CM | POA: Insufficient documentation

## 2017-04-13 DIAGNOSIS — I10 Essential (primary) hypertension: Secondary | ICD-10-CM | POA: Diagnosis not present

## 2017-04-13 DIAGNOSIS — J439 Emphysema, unspecified: Secondary | ICD-10-CM | POA: Diagnosis not present

## 2017-04-13 DIAGNOSIS — I700 Atherosclerosis of aorta: Secondary | ICD-10-CM | POA: Insufficient documentation

## 2017-04-13 DIAGNOSIS — I708 Atherosclerosis of other arteries: Secondary | ICD-10-CM | POA: Insufficient documentation

## 2017-04-13 MED ORDER — IOPAMIDOL (ISOVUE-300) INJECTION 61%
100.0000 mL | Freq: Once | INTRAVENOUS | Status: AC | PRN
  Administered 2017-04-13: 100 mL via INTRAVENOUS

## 2017-04-13 MED ORDER — TECHNETIUM TC 99M-LABELED RED BLOOD CELLS IV KIT
20.0000 | PACK | Freq: Once | INTRAVENOUS | Status: AC | PRN
  Administered 2017-04-13: 20.92 via INTRAVENOUS

## 2017-04-14 ENCOUNTER — Encounter: Payer: Self-pay | Admitting: *Deleted

## 2017-04-14 ENCOUNTER — Ambulatory Visit: Payer: Self-pay | Admitting: General Surgery

## 2017-04-14 MED ORDER — VANCOMYCIN HCL IN DEXTROSE 1-5 GM/200ML-% IV SOLN
1000.0000 mg | INTRAVENOUS | Status: AC
  Administered 2017-04-15: 1000 mg via INTRAVENOUS

## 2017-04-15 ENCOUNTER — Encounter
Admission: RE | Disposition: A | Discharge status: Home / Self Care | Payer: Self-pay | Source: Ambulatory Visit | Attending: General Surgery

## 2017-04-15 ENCOUNTER — Ambulatory Visit
Admission: RE | Admit: 2017-04-15 | Discharge: 2017-04-15 | Disposition: A | Discharge status: Home / Self Care | Payer: BLUE CROSS/BLUE SHIELD | Source: Ambulatory Visit | Attending: General Surgery | Admitting: General Surgery

## 2017-04-15 ENCOUNTER — Ambulatory Visit: Payer: BLUE CROSS/BLUE SHIELD | Admitting: Anesthesiology

## 2017-04-15 ENCOUNTER — Ambulatory Visit: Payer: BLUE CROSS/BLUE SHIELD

## 2017-04-15 DIAGNOSIS — Z880 Allergy status to penicillin: Secondary | ICD-10-CM | POA: Insufficient documentation

## 2017-04-15 DIAGNOSIS — I10 Essential (primary) hypertension: Secondary | ICD-10-CM | POA: Diagnosis not present

## 2017-04-15 DIAGNOSIS — F17200 Nicotine dependence, unspecified, uncomplicated: Secondary | ICD-10-CM | POA: Diagnosis not present

## 2017-04-15 DIAGNOSIS — E785 Hyperlipidemia, unspecified: Secondary | ICD-10-CM | POA: Insufficient documentation

## 2017-04-15 DIAGNOSIS — Z885 Allergy status to narcotic agent status: Secondary | ICD-10-CM | POA: Insufficient documentation

## 2017-04-15 DIAGNOSIS — K219 Gastro-esophageal reflux disease without esophagitis: Secondary | ICD-10-CM | POA: Diagnosis not present

## 2017-04-15 DIAGNOSIS — C50411 Malignant neoplasm of upper-outer quadrant of right female breast: Secondary | ICD-10-CM | POA: Insufficient documentation

## 2017-04-15 DIAGNOSIS — R011 Cardiac murmur, unspecified: Secondary | ICD-10-CM | POA: Diagnosis not present

## 2017-04-15 DIAGNOSIS — F329 Major depressive disorder, single episode, unspecified: Secondary | ICD-10-CM | POA: Diagnosis not present

## 2017-04-15 DIAGNOSIS — M1990 Unspecified osteoarthritis, unspecified site: Secondary | ICD-10-CM | POA: Diagnosis not present

## 2017-04-15 DIAGNOSIS — Z452 Encounter for adjustment and management of vascular access device: Secondary | ICD-10-CM

## 2017-04-15 HISTORY — PX: PORTACATH PLACEMENT: SHX2246

## 2017-04-15 HISTORY — DX: Fracture of unspecified phalanx of unspecified thumb, initial encounter for closed fracture: S62.509A

## 2017-04-15 SURGERY — INSERTION, TUNNELED CENTRAL VENOUS DEVICE, WITH PORT
Anesthesia: General

## 2017-04-15 SURGERY — INSERTION, TUNNELED CENTRAL VENOUS DEVICE, WITH PORT
Anesthesia: General | Laterality: Left

## 2017-04-15 MED ORDER — FENTANYL CITRATE (PF) 100 MCG/2ML IJ SOLN
INTRAMUSCULAR | Status: AC
  Administered 2017-04-15: 25 ug via INTRAVENOUS
  Filled 2017-04-15: qty 2

## 2017-04-15 MED ORDER — ONDANSETRON HCL 4 MG/2ML IJ SOLN
INTRAMUSCULAR | Status: DC | PRN
  Administered 2017-04-15: 4 mg via INTRAVENOUS

## 2017-04-15 MED ORDER — LIDOCAINE HCL (PF) 1 % IJ SOLN
INTRAMUSCULAR | Status: AC
  Filled 2017-04-15: qty 30

## 2017-04-15 MED ORDER — PROPOFOL 500 MG/50ML IV EMUL
INTRAVENOUS | Status: DC | PRN
  Administered 2017-04-15: 150 ug/kg/min via INTRAVENOUS

## 2017-04-15 MED ORDER — PROPOFOL 10 MG/ML IV BOLUS
INTRAVENOUS | Status: AC
  Filled 2017-04-15: qty 20

## 2017-04-15 MED ORDER — PHENYLEPHRINE HCL 10 MG/ML IJ SOLN
INTRAMUSCULAR | Status: DC | PRN
Start: 2017-04-15 — End: 2017-04-15
  Administered 2017-04-15 (×4): 100 ug via INTRAVENOUS

## 2017-04-15 MED ORDER — TRAMADOL HCL 50 MG PO TABS
50.0000 mg | ORAL_TABLET | Freq: Four times a day (QID) | ORAL | Status: DC | PRN
  Administered 2017-04-15: 50 mg via ORAL
  Filled 2017-04-15 (×2): qty 1

## 2017-04-15 MED ORDER — PROPOFOL 500 MG/50ML IV EMUL
INTRAVENOUS | Status: AC
  Filled 2017-04-15: qty 50

## 2017-04-15 MED ORDER — FAMOTIDINE 20 MG PO TABS
20.0000 mg | ORAL_TABLET | Freq: Once | ORAL | Status: AC
  Administered 2017-04-15: 20 mg via ORAL

## 2017-04-15 MED ORDER — FENTANYL CITRATE (PF) 100 MCG/2ML IJ SOLN
INTRAMUSCULAR | Status: AC
  Filled 2017-04-15: qty 2

## 2017-04-15 MED ORDER — MIDAZOLAM HCL 2 MG/2ML IJ SOLN
INTRAMUSCULAR | Status: DC | PRN
  Administered 2017-04-15: 2 mg via INTRAVENOUS

## 2017-04-15 MED ORDER — FAMOTIDINE 20 MG PO TABS
ORAL_TABLET | ORAL | Status: AC
  Administered 2017-04-15: 20 mg via ORAL
  Filled 2017-04-15: qty 1

## 2017-04-15 MED ORDER — TRAMADOL HCL 50 MG PO TABS
ORAL_TABLET | ORAL | Status: AC
  Filled 2017-04-15: qty 1

## 2017-04-15 MED ORDER — SODIUM CHLORIDE 0.9 % IV SOLN
INTRAVENOUS | Status: DC | PRN
  Administered 2017-04-15: 22 mL via INTRAMUSCULAR

## 2017-04-15 MED ORDER — MIDAZOLAM HCL 2 MG/2ML IJ SOLN
INTRAMUSCULAR | Status: AC
  Filled 2017-04-15: qty 2

## 2017-04-15 MED ORDER — BISOPROLOL FUMARATE 5 MG PO TABS
5.0000 mg | ORAL_TABLET | Freq: Once | ORAL | Status: AC
  Administered 2017-04-15: 5 mg via ORAL
  Filled 2017-04-15: qty 1

## 2017-04-15 MED ORDER — PROPOFOL 10 MG/ML IV BOLUS
INTRAVENOUS | Status: DC | PRN
  Administered 2017-04-15: 40 mg via INTRAVENOUS

## 2017-04-15 MED ORDER — LIDOCAINE HCL (PF) 1 % IJ SOLN
INTRAMUSCULAR | Status: DC | PRN
  Administered 2017-04-15: 8 mL

## 2017-04-15 MED ORDER — SODIUM CHLORIDE 0.9 % IJ SOLN
INTRAMUSCULAR | Status: AC
  Filled 2017-04-15: qty 50

## 2017-04-15 MED ORDER — FENTANYL CITRATE (PF) 100 MCG/2ML IJ SOLN
25.0000 ug | INTRAMUSCULAR | Status: DC | PRN
  Administered 2017-04-15 (×3): 25 ug via INTRAVENOUS

## 2017-04-15 MED ORDER — ONDANSETRON HCL 4 MG/2ML IJ SOLN: 4.0000 mg | Freq: Once | INTRAMUSCULAR | Status: DC | PRN

## 2017-04-15 MED ORDER — TRAMADOL HCL 50 MG PO TABS: 50.0000 mg | ORAL_TABLET | Freq: Four times a day (QID) | ORAL | 0 refills | Status: AC | PRN

## 2017-04-15 MED ORDER — VANCOMYCIN HCL IN DEXTROSE 1-5 GM/200ML-% IV SOLN
INTRAVENOUS | Status: AC
  Administered 2017-04-15: 1000 mg via INTRAVENOUS
  Filled 2017-04-15: qty 200

## 2017-04-15 MED ORDER — HEPARIN SODIUM (PORCINE) 5000 UNIT/ML IJ SOLN
INTRAMUSCULAR | Status: AC
  Filled 2017-04-15: qty 1

## 2017-04-15 MED ORDER — LACTATED RINGERS IV SOLN
INTRAVENOUS | Status: DC
  Administered 2017-04-15 (×2): via INTRAVENOUS

## 2017-04-15 SURGICAL SUPPLY — 25 items
CANISTER SUCT 1200ML W/VALVE (MISCELLANEOUS) ×3 IMPLANT
CHLORAPREP W/TINT 26ML (MISCELLANEOUS) ×3 IMPLANT
COVER LIGHT HANDLE STERIS (MISCELLANEOUS) ×6 IMPLANT
COVER TRANSDUCER ULTRASOUND (MISCELLANEOUS) ×3 IMPLANT
DERMABOND ADVANCED (GAUZE/BANDAGES/DRESSINGS) ×2
DERMABOND ADVANCED .7 DNX12 (GAUZE/BANDAGES/DRESSINGS) ×1 IMPLANT
DRAPE C-ARM XRAY 36X54 (DRAPES) ×3 IMPLANT
ELECT REM PT RETURN 9FT ADLT (ELECTROSURGICAL) ×3
ELECTRODE REM PT RTRN 9FT ADLT (ELECTROSURGICAL) ×1 IMPLANT
GLOVE BIO SURGEON STRL SZ 6.5 (GLOVE) ×2 IMPLANT
GLOVE BIO SURGEONS STRL SZ 6.5 (GLOVE) ×1
GOWN STRL REUS W/ TWL LRG LVL3 (GOWN DISPOSABLE) ×3 IMPLANT
GOWN STRL REUS W/TWL LRG LVL3 (GOWN DISPOSABLE) ×6
KIT PORT POWER 8FR ISP CVUE (Miscellaneous) ×3 IMPLANT
KIT RM TURNOVER STRD PROC AR (KITS) ×3 IMPLANT
LABEL OR SOLS (LABEL) ×3 IMPLANT
NEEDLE FILTER BLUNT 18X 1/2SAF (NEEDLE) ×2
NEEDLE FILTER BLUNT 18X1 1/2 (NEEDLE) ×1 IMPLANT
PACK PORT-A-CATH (MISCELLANEOUS) ×3 IMPLANT
SUT MNCRL AB 4-0 PS2 18 (SUTURE) ×3 IMPLANT
SUT SILK 4 0 SH (SUTURE) ×3 IMPLANT
SUT VIC AB 3-0 SH 27 (SUTURE) ×2
SUT VIC AB 3-0 SH 27X BRD (SUTURE) ×1 IMPLANT
SYR 3ML LL SCALE MARK (SYRINGE) ×3 IMPLANT
SYRINGE 10CC LL (SYRINGE) ×3 IMPLANT

## 2017-04-15 NOTE — Anesthesia Preprocedure Evaluation (Signed)
Anesthesia Evaluation  Patient identified by MRN, date of birth, ID band Patient awake    Reviewed: Allergy & Precautions, H&P , NPO status , Patient's Chart, lab work & pertinent test results, reviewed documented beta blocker date and time   Airway Mallampati: II   Neck ROM: full    Dental  (+) Poor Dentition   Pulmonary neg pulmonary ROS, Current Smoker,    Pulmonary exam normal        Cardiovascular Exercise Tolerance: Good hypertension, On Medications negative cardio ROS Normal cardiovascular exam+ Valvular Problems/Murmurs  Rhythm:regular Rate:Normal     Neuro/Psych  Headaches, negative neurological ROS  negative psych ROS   GI/Hepatic negative GI ROS, Neg liver ROS, GERD  Medicated,  Endo/Other  negative endocrine ROS  Renal/GU negative Renal ROS  negative genitourinary   Musculoskeletal   Abdominal   Peds  Hematology negative hematology ROS (+)   Anesthesia Other Findings Past Medical History: No date: Arthritis No date: Cancer (HCC) No date: GERD (gastroesophageal reflux disease)     Comment:  NO MEDS No date: Heart murmur No date: Hypertension No date: Migraines No date: Thumb fracture     Comment:  LEFT-WEARING BRACE Past Surgical History: 03/19/2017: BREAST BIOPSY; Right     Comment:  US guided biopsy of right breast and right lymph node No date: CESAREAN SECTION No date: FRACTURE SURGERY     Comment:  left hand 1980'S: PILONIDAL CYST EXCISION BMI    Body Mass Index:  31.58 kg/m     Reproductive/Obstetrics negative OB ROS                             Anesthesia Physical Anesthesia Plan  ASA: II  Anesthesia Plan: General   Post-op Pain Management:    Induction:   PONV Risk Score and Plan: 3 and Ondansetron, Dexamethasone, Midazolam and Propofol infusion  Airway Management Planned:   Additional Equipment:   Intra-op Plan:   Post-operative Plan:    Informed Consent: I have reviewed the patients History and Physical, chart, labs and discussed the procedure including the risks, benefits and alternatives for the proposed anesthesia with the patient or authorized representative who has indicated his/her understanding and acceptance.   Dental Advisory Given  Plan Discussed with: CRNA  Anesthesia Plan Comments:         Anesthesia Quick Evaluation

## 2017-04-15 NOTE — Discharge Instructions (Addendum)
  AMBULATORY SURGERY  DISCHARGE INSTRUCTIONS   1) The drugs that you were given will stay in your system until tomorrow so for the next 24 hours you should not:  A) Drive an automobile B) Make any legal decisions C) Drink any alcoholic beverage   2) You may resume regular meals tomorrow.  Today it is better to start with liquids and gradually work up to solid foods.  You may eat anything you prefer, but it is better to start with liquids, then soup and crackers, and gradually work up to solid foods.   3) Please notify your doctor immediately if you have any unusual bleeding, trouble breathing, redness and pain at the surgery site, drainage, fever, or pain not relieved by medication.    4) Additional Instructions: TAKE A STOOL SOFTENER TWICE A DAY WHILE TAKING NARCOTIC PAIN MEDICINE TO PREVENT CONSTIPATION   Please contact your physician with any problems or Same Day Surgery at [phone], Monday through Friday 6 am to 4 pm, or [place] [facility] [place] Main number at [phone].   

## 2017-04-15 NOTE — Anesthesia Post-op Follow-up Note (Signed)
Anesthesia QCDR form completed.        

## 2017-04-15 NOTE — Op Note (Addendum)
SURGICAL PROCEDURE REPORT  DATE OF PROCEDURE: 04/15/2017   SURGEON: Dr. Windell Moment   ANESTHESIA: Local with light IV sedation   PRE-OPERATIVE DIAGNOSIS: Advance right breast cancer requiring durable central venous access for chemotherapy   POST-OPERATIVE DIAGNOSIS: Advance right breast cancer requiring durable central venous access for chemotherapy   PROCEDURE(S):  1.) Percutaneous access of Left internal jugular vein under ultrasound guidance  2.) Insertion of tunneled Left internal jugular central venous catheter with subcutaneous port  INTRAOPERATIVE FINDINGS: Patent easily compressible Left internal jugular vein with appropriate respiratory variations and well-secured tunneled central venous catheter with subcutaneous port at completion of the procedure  ESTIMATED BLOOD LOSS: Minimal (<20 mL)   SPECIMENS: None   IMPLANTS: 86F tunneled Bard PowerPort central venous catheter with subcutaneous port  DRAINS: None   COMPLICATIONS: None apparent   CONDITION AT COMPLETION: Hemodynamically stable, awake   DISPOSITION: PACU   INDICATION(S) FOR PROCEDURE:  Patient is a 56 y.o. female who presented with advanced breast cancer requiring durable central venous access for chemotherapy. All risks, benefits, and alternatives to above elective procedures were discussed with the patient, who elected to proceed, and informed consent was accordingly obtained at that time.  DETAILS OF PROCEDURE:  Patient was brought to the operative suite and appropriately identified. In Trendelenburg position, Left IJ venous access site was prepped and draped in the usual sterile fashion, and following a brief timeout, percutaneous Left IJ venous access was obtained under ultrasound guidance using Seldinger technique, by which local anesthetic was injected over the Left IJ vein, and access needle was inserted under direct ultrasound visualization into the Left IJ vein, through which soft guidewire was  advanced, over which access needle was withdrawn. Guidewire was secured, attention was directed to injection of local anesthetic along the planned tunnel site, 3 cm transverse Left chest incision was made and confirmed to accommodate the subcutaneous port, and flushed catheter was tunneled retrograde from the port site over the Left chest to the Left IJ access site with the attached port well-secured to the catheter and within the subcutaneous pocket. Insertion sheath was advanced over the guidewire, which was withdrawn along with the insertion sheath dilator. Length of catheter needed to position the catheter tip at the atrio-caval junction was then measured under direct fluoroscopic visualization, after which the catheter was cut to the measured length and advanced through the sheath into the Left internal jugular vein and SVC without evidence of cardiac arrhythmias during the procedure. Port was confirmed to withdraw blood and flush easily, after which concentrated heparin was instilled into the port and catheter. Dermis at the subcutaneous pocket was re-approximated using buried interrupted 3-0 Vicryl suture, and 4-0 Monocryl suture was used to re-approximate skin at the insertion and subcutaneous port sites in running subcuticular fashion for the subcutaneous port and buried interrupted fashion for the insertion site. Skin was cleaned, dried, and sterile skin glue was applied. Patient was then safely transferred to PACU for a chest x-ray.

## 2017-04-15 NOTE — Anesthesia Procedure Notes (Signed)
Date/Time: 04/15/2017 7:39 AM Performed by: Nelda Marseille Pre-anesthesia Checklist: Patient identified, Emergency Drugs available, Suction available, Patient being monitored and Timeout performed Oxygen Delivery Method: Simple face mask

## 2017-04-15 NOTE — Transfer of Care (Signed)
Immediate Anesthesia Transfer of Care Note  Patient: Destiny Rogers  Procedure(s) Performed: Procedure(s): INSERTION PORT-A-CATH (N/A)  Patient Location: PACU  Anesthesia Type:General  Level of Consciousness: sedated  Airway & Oxygen Therapy: Patient Spontanous Breathing and Patient connected to face mask oxygen  Post-op Assessment: Report given to RN and Post -op Vital signs reviewed and stable  Post vital signs: Reviewed and stable  Last Vitals:  Vitals:   04/15/17 0634  BP: 138/76  Pulse: (!) 57  Resp: (!) 108  Temp: (!) 35.7 C  SpO2: 100%    Last Pain:  Vitals:   04/15/17 0634  TempSrc: Tympanic         Complications: No apparent anesthesia complications

## 2017-04-15 NOTE — Patient Instructions (Signed)

## 2017-04-15 NOTE — Interval H&P Note (Signed)
History and Physical Interval Note:  04/15/2017 6:53 AM  Destiny Rogers  has presented today for surgery, with the diagnosis of PRIMARY MALIGNANT NEOPLASM OF UPPER OUTER QUADRANT OF FEMALE BREAST  The various methods of treatment have been discussed with the patient and family. After consideration of risks, benefits and other options for treatment, the patient has consented to  Procedure(s): INSERTION PORT-A-CATH (N/A) as a surgical intervention .  The patient's history has been reviewed, patient examined, no change in status, stable for surgery.  I have reviewed the patient's chart and labs.  Questions were answered to the patient's satisfaction.     Herbert Pun

## 2017-04-15 NOTE — H&P (View-Only) (Signed)
HISTORY OF PRESENT ILLNESS:    Ms. Hughson is a 56 y.o.female patient who is known to Dr. Tamala Julian due to a diagnosis of breast Cancer. Patient comes today to the clinic referred by Oncologist for [facility] placement. The patient comes today with her son and her mother for orientation of the procedure and possible coordination. Patient denies any change since last visit. Patient is not complaining of shortness of breath or chest pain. Patient denies taking any blood thinner or anticoagulants. Patient refers is able to move all her extremities without any problem.         PAST MEDICAL HISTORY:      Past Medical History:  Diagnosis Date  . Depression, unspecified   . Hyperlipidemia, unspecified, unspecified   . Hypertension   . Migraine      PAST SURGICAL HISTORY:        Past Surgical History:  Procedure Laterality Date  . CESAREAN SECTION    . DILATION AND CURETTAGE, DIAGNOSTIC / THERAPEUTIC  2008   and uterine ablation - Dr. Laurey Morale  . TUBAL LIGATION       MEDICATIONS:  EncounterMedications        Outpatient Encounter Prescriptions as of 04/07/2017  Medication Sig Dispense Refill  . bisoprolol-hydrochlorothiazide (ZIAC) 5-6.25 mg tablet TAKE 1 TABLET BY MOUTH ONCE DAILY. 30 tablet 11  . buPROPion (WELLBUTRIN SR) 150 MG SR tablet Take 1 tablet (150 mg total) by mouth 2 (two) times daily. 60 tablet 11   No facility-administered encounter medications on file as of 04/07/2017.        ALLERGIES:   Norco [hydrocodone-acetaminophen] and Penicillins.   SOCIAL HISTORY:  Social History        Social History  . Marital status: Single    Spouse name: N/A  . Number of children: N/A  . Years of education: N/A       Social History Main Topics  . Smoking status: Current Every Day Smoker  . Smokeless tobacco: Never Used  . Alcohol use No  . Drug use: Unknown  . Sexual activity: Not Asked       Other Topics Concern  . None      Social History Narrative  .  None  .  FAMILY HISTORY:       Family History  Problem Relation Age of Onset  . High blood pressure (Hypertension) Mother   . Diabetes type II Father   . Lung cancer Father   . Diabetes type II Sister   . Alzheimer's disease Maternal Grandfather    .  GENERAL REVIEW OF SYSTEMS:   PHYSICAL EXAM:     Vitals:   04/07/17 0750  BP: 150/85  Pulse: 88  Temp: 36.4 C (97.6 F)   Ht:165.1 cm (5\' 5" ) Wt:84.8 kg (186 lb 14.4 oz) ZOX:WRUE surface area is 1.97 meters squared. Body mass index is 31.1 kg/m.Marland Kitchen    PHYSICAL EXAM: General: alert, active, oriented x3. No distress. A little nervous Heart: regular rate Breast: Right breast with adequate wound healing. No skin changes. Normal bilateral nipples. Right breast right outer quadrant fullness palpated. Normal left breast.  Abdomen: soft and depressible, non tender, non distended.  Extremities. No edema, no cyanosis.     IMPRESSION AND PLAN:     Patient with known diagnosis of breast cancer of the right breast. The patient was oriented about the procedure steps, the benefits of having a Chemo port and the risk. Patient is going to start chemotherapy on April 21, 2017.  The alternative to receive the chemotherapy through a peripheral vein and wait until Dr. Tamala Julian is available to perform the procedure was given to the patient but the patient is aware of the benefits of having the port and of the risks of receiving the chemo through a peripheral veins. All questions were answered to the patient and her mother. Will proceed to place the [facility].   I was 35 minutes orienting the patient about the procedure, the day of surgery, how to care the wound and the port area, answering all the questions to the patient and family.   Patient and family members verbalized understanding, all questions were answered, and were agreeable with the plan outlined above.   Herbert Pun, MD  Electronically signed by Herbert Pun,  MD

## 2017-04-16 ENCOUNTER — Inpatient Hospital Stay: Payer: BLUE CROSS/BLUE SHIELD

## 2017-04-16 NOTE — Anesthesia Postprocedure Evaluation (Signed)
Anesthesia Post Note  Patient: Destiny Rogers  Procedure(s) Performed: Procedure(s) (LRB): INSERTION PORT-A-CATH (N/A)  Patient location during evaluation: PACU Anesthesia Type: General Level of consciousness: awake and alert Pain management: pain level controlled Vital Signs Assessment: post-procedure vital signs reviewed and stable Respiratory status: spontaneous breathing, nonlabored ventilation, respiratory function stable and patient connected to nasal cannula oxygen Cardiovascular status: blood pressure returned to baseline and stable Postop Assessment: no apparent nausea or vomiting Anesthetic complications: no     Last Vitals:  Vitals:   04/15/17 1032 04/15/17 1050  BP: (!) 146/53 (!) 139/58  Pulse: (!) 58 (!) 52  Resp: 20 20  Temp:    SpO2: 100% 100%    Last Pain:  Vitals:   04/16/17 0829  TempSrc:   PainSc: 0-No pain                 Molli Barrows

## 2017-04-19 NOTE — Progress Notes (Signed)
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  INTERVAL HISTORY: Patient returns to clinic today for further evaluation and initiation of cycle 1 of 4 of Adriamycin and Cytoxan. She recently had a port placement and tolerated this well. She is anxious, but otherwise feels well. She has no neurologic complaints. She denies any pain. She has a good appetite and denies weight loss. She has no chest pain or shortness of breath. She denies any nausea, vomiting, constipation, or diarrhea. She has no urinary complaints. Patient offers no specific complaints today.  REVIEW OF SYSTEMS:   Review of Systems  Constitutional: Negative.  Negative for fever, malaise/fatigue and weight loss.  Respiratory: Negative.  Negative for cough and shortness of breath.   Cardiovascular: Negative.  Negative for chest pain and leg swelling.  Gastrointestinal: Negative.  Negative for abdominal pain.  Genitourinary: Negative.   Musculoskeletal: Negative.   Skin: Negative for rash.  Neurological: Negative.  Negative for sensory change and weakness.  Psychiatric/Behavioral: The patient is nervous/anxious.     As per HPI. Otherwise, a complete review of systems is negative.  PAST MEDICAL HISTORY: Past Medical History:  Diagnosis Date  . Arthritis   . Cancer ([place])   . GERD (gastroesophageal reflux disease)    NO MEDS  . Heart murmur   . Hypertension   . Migraines   . Thumb fracture    LEFT-WEARING BRACE    PAST SURGICAL HISTORY: Past Surgical History:  Procedure Laterality Date  . BREAST BIOPSY Right 03/19/2017   US guided biopsy of right breast and right lymph node  . CESAREAN SECTION    .  FRACTURE SURGERY     left hand  . PILONIDAL CYST EXCISION  1980'S  . PORTACATH PLACEMENT N/A 04/15/2017   Procedure: INSERTION PORT-A-CATH;  Surgeon: Herbert Pun, MD;  Location: [facility] ORS;  Service: General;  Laterality: N/A;    FAMILY HISTORY: Family History  Problem Relation Age of Onset  . Breast cancer Maternal Grandmother   . Hypertension Maternal Grandmother   . Angina Mother   . Lung cancer Father   . Diabetes Father   . Prostate cancer Maternal Uncle   . Dementia Maternal Grandfather   . Congestive Heart Failure Maternal Grandfather   . Heart disease Paternal Grandmother     ADVANCED DIRECTIVES (Y/N):  N  HEALTH MAINTENANCE: Social History  Substance Use Topics  . Smoking status: Current Every Day Smoker    Packs/day: 2.00    Years: 18.00    Types: Cigarettes  . Smokeless tobacco: Never Used     Comment: PT IS NOW DOWN TO 1/2 PACK SINCE CANCER DIAGNOSIS   . Alcohol use No     Colonoscopy:  PAP:  Bone density:  Lipid panel:  Allergies  Allergen Reactions  . Penicillins Swelling    Throat swelling Has patient had a PCN reaction causing immediate rash, facial/tongue/throat swelling, SOB or lightheadedness with hypotension: Yes Has patient had a PCN reaction causing severe rash involving mucus membranes or skin necrosis: No Has patient had a PCN reaction that required hospitalization: No Has patient had a PCN reaction occurring within the last 10 years: No If all of the above answers are "NO", then may proceed with  Cephalosporin use.   Marland Kitchen Hydrocodone-Acetaminophen Itching and Rash    Current Outpatient Prescriptions  Medication Sig Dispense Refill  . acetaminophen (TYLENOL) 500 MG tablet Take 1,000 mg by mouth every 8 (eight) hours as needed for mild pain.    . bisoprolol-hydrochlorothiazide (ZIAC) 5-6.25 MG tablet TAKE 1 TABLET BY MOUTH ONCE DAILY-AM    . lidocaine-prilocaine (EMLA) cream Apply to affected area once (Patient taking differently:  Apply 1 application topically See admin instructions. Apply to affected area once) 30 g 3  . ondansetron (ZOFRAN) 8 MG tablet Take 1 tablet (8 mg total) by mouth 2 (two) times daily as needed. (Patient taking differently: Take 8 mg by mouth 2 (two) times daily as needed for nausea or vomiting. ) 60 tablet 2  . prochlorperazine (COMPAZINE) 10 MG tablet Take 1 tablet (10 mg total) by mouth every 6 (six) hours as needed (Nausea or vomiting). 60 tablet 2  . traMADol (ULTRAM) 50 MG tablet Take 50 mg by mouth 2 (two) times daily.    . meloxicam (MOBIC) 7.5 MG tablet Take 7.5 mg by mouth daily.     No current facility-administered medications for this visit.    Facility-Administered Medications Ordered in Other Visits  Medication Dose Route Frequency Provider Last Rate Last Dose  . heparin lock flush 100 unit/mL  500 Units Intravenous Once Lloyd Huger, MD      . sodium chloride flush (NS) 0.9 % injection 10 mL  10 mL Intravenous PRN Lloyd Huger, MD   10 mL at 04/21/17 1013    OBJECTIVE: Vitals:   04/21/17 0928  BP: (!) 159/91  Pulse: 74  Resp: 18  Temp: (!) 96.8 F (36 C)     Body mass index is 33.03 kg/m.    ECOG FS:0 - Asymptomatic  General: Well-developed, well-nourished, no acute distress. Eyes: Pink conjunctiva, anicteric sclera. HEENT: Normocephalic, moist mucous membranes, clear oropharnyx. Breasts: Patient requested exam be deferred today. Lungs: Clear to auscultation bilaterally. Heart: Regular rate and rhythm. No rubs, murmurs, or gallops. Abdomen: Soft, nontender, nondistended. No organomegaly noted, normoactive bowel sounds. Musculoskeletal: No edema, cyanosis, or clubbing. Neuro: Alert, answering all questions appropriately. Cranial nerves grossly intact. Skin: No rashes or petechiae noted. Psych: Normal affect. Lymphatics: No cervical, calvicular, axillary or inguinal LAD.   LAB RESULTS:  Lab Results  Component Value Date   NA 140 04/21/2017   K 3.7  04/21/2017   CL 105 04/21/2017   CO2 29 04/21/2017   GLUCOSE 101 (H) 04/21/2017   BUN 9 04/21/2017   CREATININE 0.70 04/21/2017   CALCIUM 9.2 04/21/2017   PROT 7.2 04/21/2017   ALBUMIN 3.7 04/21/2017   AST 20 04/21/2017   ALT 16 04/21/2017   ALKPHOS 104 04/21/2017   BILITOT 0.2 (L) 04/21/2017   GFRNONAA >60 04/21/2017   GFRAA >60 04/21/2017    Lab Results  Component Value Date   WBC 10.6 04/21/2017   NEUTROABS 6.0 04/21/2017   HGB 12.4 04/21/2017   HCT 36.9 04/21/2017   MCV 69.8 (L) 04/21/2017   PLT 237 04/21/2017     STUDIES: Ct Chest W Contrast  Result Date: 04/13/2017 CLINICAL DATA:  New diagnosis of right breast cancer. EXAM: CT CHEST, ABDOMEN, AND PELVIS WITH CONTRAST TECHNIQUE: Multidetector CT imaging of the chest, abdomen and pelvis was performed following the standard protocol during bolus administration of intravenous contrast. CONTRAST:  166m ISOVUE-300 IOPAMIDOL (ISOVUE-300) INJECTION 61% COMPARISON:  None. FINDINGS: CT CHEST FINDINGS Cardiovascular: The heart is normal in  size. No pericardial effusion. The aorta and branch vessels are normal. Mediastinum/Nodes: No mediastinal or hilar mass or lymphadenopathy. Esophagus is grossly normal. Lungs/Pleura: No worrisome pulmonary lesions or pulmonary nodules to suggest metastatic disease. Mild emphysematous changes with largely paraseptal emphysema. No infiltrates, edema or effusions. No interstitial lung disease or bronchiectasis. Musculoskeletal: 2.5 cm right breast mass containing a biopsy clip. No supraclavicular or axillary lymphadenopathy. Small scattered lymph nodes are noted bilaterally. The thyroid gland appears normal. No significant bony findings no evidence of osseous metastatic disease. CT ABDOMEN PELVIS FINDINGS Hepatobiliary: No focal hepatic lesions to suggest metastatic disease. The gallbladder is normal. No common bile duct dilatation. Pancreas: No mass, inflammation or ductal dilatation. Spleen: Normal size.   No focal lesions. Adrenals/Urinary Tract: The adrenal glands and kidneys are unremarkable. The bladder is normal. Stomach/Bowel: The stomach, duodenum, small bowel and colon are normal. No inflammatory changes, mass lesions or obstructive findings. The terminal ileum is normal. The appendix is normal. Vascular/Lymphatic: Moderate, age advanced, distal aortic and iliac artery atherosclerotic calcifications. No aneurysm or dissection. The branch vessels are patent. The major venous structures are patent. No mesenteric or retroperitoneal mass or lymphadenopathy. Reproductive: Normal uterus and ovaries. Other: No pelvic mass or adenopathy. No free pelvic fluid collections. No inguinal mass or adenopathy. No abdominal wall hernia or subcutaneous lesions. Musculoskeletal: No lytic or sclerotic bone lesions to suggest metastasis. Advanced facet disease in the lower lumbar spine is noted along with advanced degenerate disc disease at L5-S1. IMPRESSION: 1. Right breast mass but no enlarged supraclavicular or axillary lymph nodes. 2. No CT findings for metastatic disease involving the chest, abdomen or pelvis. 3. Emphysematous changes but no acute pulmonary findings. 4. No findings to suggest osseous metastatic disease. 5. Age advanced atherosclerotic calcifications involving the distal aorta and iliac arteries. Electronically Signed   By: Marijo Sanes M.D.   On: 04/13/2017 14:52   Nm Cardiac Muga Rest  Result Date: 04/13/2017 CLINICAL DATA:  RIGHT breast cancer, pre cardiotoxic chemotherapy EXAM: NUCLEAR MEDICINE CARDIAC BLOOD POOL IMAGING (MUGA) TECHNIQUE: Cardiac multi-gated acquisition was performed at rest following intravenous injection of Tc-11mlabeled red blood cells. RADIOPHARMACEUTICALS:  20.92 mCi Tc-962mertechnetate in-vitro labeled autologous red blood cells IV COMPARISON:  None FINDINGS: LEFT ventricular ejection fraction is calculated at 53%. Study was obtained at a heart rate of 70 beats per minute.  Patient was rhythmic during imaging. No focal wall motion abnormalities identified. IMPRESSION: LEFT ventricular ejection fraction of 53%. No focal wall motion abnormalities. Electronically Signed   By: MaLavonia Dana.D.   On: 04/13/2017 12:31   Ct Abdomen Pelvis W Contrast  Result Date: 04/13/2017 CLINICAL DATA:  New diagnosis of right breast cancer. EXAM: CT CHEST, ABDOMEN, AND PELVIS WITH CONTRAST TECHNIQUE: Multidetector CT imaging of the chest, abdomen and pelvis was performed following the standard protocol during bolus administration of intravenous contrast. CONTRAST:  10061mSOVUE-300 IOPAMIDOL (ISOVUE-300) INJECTION 61% COMPARISON:  None. FINDINGS: CT CHEST FINDINGS Cardiovascular: The heart is normal in size. No pericardial effusion. The aorta and branch vessels are normal. Mediastinum/Nodes: No mediastinal or hilar mass or lymphadenopathy. Esophagus is grossly normal. Lungs/Pleura: No worrisome pulmonary lesions or pulmonary nodules to suggest metastatic disease. Mild emphysematous changes with largely paraseptal emphysema. No infiltrates, edema or effusions. No interstitial lung disease or bronchiectasis. Musculoskeletal: 2.5 cm right breast mass containing a biopsy clip. No supraclavicular or axillary lymphadenopathy. Small scattered lymph nodes are noted bilaterally. The thyroid gland appears normal. No significant bony findings no evidence  of osseous metastatic disease. CT ABDOMEN PELVIS FINDINGS Hepatobiliary: No focal hepatic lesions to suggest metastatic disease. The gallbladder is normal. No common bile duct dilatation. Pancreas: No mass, inflammation or ductal dilatation. Spleen: Normal size.  No focal lesions. Adrenals/Urinary Tract: The adrenal glands and kidneys are unremarkable. The bladder is normal. Stomach/Bowel: The stomach, duodenum, small bowel and colon are normal. No inflammatory changes, mass lesions or obstructive findings. The terminal ileum is normal. The appendix is normal.  Vascular/Lymphatic: Moderate, age advanced, distal aortic and iliac artery atherosclerotic calcifications. No aneurysm or dissection. The branch vessels are patent. The major venous structures are patent. No mesenteric or retroperitoneal mass or lymphadenopathy. Reproductive: Normal uterus and ovaries. Other: No pelvic mass or adenopathy. No free pelvic fluid collections. No inguinal mass or adenopathy. No abdominal wall hernia or subcutaneous lesions. Musculoskeletal: No lytic or sclerotic bone lesions to suggest metastasis. Advanced facet disease in the lower lumbar spine is noted along with advanced degenerate disc disease at L5-S1. IMPRESSION: 1. Right breast mass but no enlarged supraclavicular or axillary lymph nodes. 2. No CT findings for metastatic disease involving the chest, abdomen or pelvis. 3. Emphysematous changes but no acute pulmonary findings. 4. No findings to suggest osseous metastatic disease. 5. Age advanced atherosclerotic calcifications involving the distal aorta and iliac arteries. Electronically Signed   By: Marijo Sanes M.D.   On: 04/13/2017 14:52   Dg Chest Port 1 View  Result Date: 04/15/2017 CLINICAL DATA:  Status post port insertion EXAM: PORTABLE CHEST 1 VIEW COMPARISON:  None. FINDINGS: Cardiac shadow is within normal limits. New left chest wall port is noted with the catheter tip at the proximal superior vena cava. No pneumothorax is noted. No infiltrate is seen. No bony abnormality is noted. IMPRESSION: No pneumothorax following port placement Electronically Signed   By: Inez Catalina M.D.   On: 04/15/2017 09:51   Dg C-arm 1-60 Min-no Report  Result Date: 04/15/2017 Fluoroscopy was utilized by the requesting physician.  No radiographic interpretation.    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  PLAN:    1. Clinical stage IB ER/PR positive, HER-2/neu negative invasive carcinoma  of the upper outer quadrant of the right breast: Given the fact the patient has node-positive disease, she will require chemotherapy. After lengthy discussion with the patient, she is highly interested in breast conservation and wants to minimize the amount of surgery. Because of this, we will proceed with neoadjuvant chemotherapy using Adriamycin, Cytoxan, and Taxol with Neulasta support. Once patient completes her chemotherapy, she will require lumpectomy followed by XRT as well as an aromatase inhibitor for 5 years. CT scan from April 13, 2017 did not reveal any metastatic disease. Pretreatment MUGA is adequate to proceed with an EF of 53%. Proceed with cycle 1 of 4 of dose dense Adriamycin and Cytoxan with Neulasta support. Return to clinic in 1 week for laboratory work and further evaluation and then in 2 weeks for consideration of cycle 2. 2. Right hand fracture: Continue monitoring treatment per orthopedics.  Approximately 30 minutes was spent in discussion of which greater than 50% was consultation.  Patient expressed understanding and was in agreement with this plan. She also understands that She can call clinic at any time with any questions, concerns, or complaints.   Cancer Staging Primary cancer of upper outer quadrant of right female breast [facility]) Staging form: Breast, AJCC 8th Edition - Clinical stage from 03/27/2017: Stage  IB (cT1c, cN1, cM0, G2, ER: Positive, PR: Positive, HER2: Negative) - Signed by Lloyd Huger, MD on 03/27/2017   Lloyd Huger, MD   04/21/2017 10:40 AM

## 2017-04-21 ENCOUNTER — Encounter: Payer: Self-pay | Admitting: *Deleted

## 2017-04-21 ENCOUNTER — Inpatient Hospital Stay: Payer: BLUE CROSS/BLUE SHIELD

## 2017-04-21 ENCOUNTER — Inpatient Hospital Stay: Payer: BLUE CROSS/BLUE SHIELD | Attending: Oncology | Admitting: Oncology

## 2017-04-21 VITALS — BP 159/91 | HR 74 | Temp 96.8°F | Resp 18 | Wt 192.4 lb

## 2017-04-21 DIAGNOSIS — K219 Gastro-esophageal reflux disease without esophagitis: Secondary | ICD-10-CM | POA: Diagnosis not present

## 2017-04-21 DIAGNOSIS — Z79899 Other long term (current) drug therapy: Secondary | ICD-10-CM | POA: Diagnosis not present

## 2017-04-21 DIAGNOSIS — Z170 Estrogen receptor positive status [ER+]: Secondary | ICD-10-CM | POA: Diagnosis not present

## 2017-04-21 DIAGNOSIS — N764 Abscess of vulva: Secondary | ICD-10-CM | POA: Diagnosis not present

## 2017-04-21 DIAGNOSIS — M898X9 Other specified disorders of bone, unspecified site: Secondary | ICD-10-CM | POA: Diagnosis not present

## 2017-04-21 DIAGNOSIS — C50411 Malignant neoplasm of upper-outer quadrant of right female breast: Secondary | ICD-10-CM

## 2017-04-21 DIAGNOSIS — T451X5S Adverse effect of antineoplastic and immunosuppressive drugs, sequela: Secondary | ICD-10-CM | POA: Insufficient documentation

## 2017-04-21 DIAGNOSIS — F17210 Nicotine dependence, cigarettes, uncomplicated: Secondary | ICD-10-CM | POA: Insufficient documentation

## 2017-04-21 DIAGNOSIS — I10 Essential (primary) hypertension: Secondary | ICD-10-CM | POA: Insufficient documentation

## 2017-04-21 DIAGNOSIS — Z5111 Encounter for antineoplastic chemotherapy: Secondary | ICD-10-CM | POA: Diagnosis not present

## 2017-04-21 DIAGNOSIS — Z95828 Presence of other vascular implants and grafts: Secondary | ICD-10-CM

## 2017-04-21 DIAGNOSIS — D701 Agranulocytosis secondary to cancer chemotherapy: Secondary | ICD-10-CM | POA: Diagnosis not present

## 2017-04-21 LAB — COMPREHENSIVE METABOLIC PANEL
ALBUMIN: 3.7 g/dL (ref 3.5–5.0)
ALT: 16 U/L (ref 14–54)
AST: 20 U/L (ref 15–41)
Alkaline Phosphatase: 104 U/L (ref 38–126)
Anion gap: 6 (ref 5–15)
BILIRUBIN TOTAL: 0.2 mg/dL — AB (ref 0.3–1.2)
BUN: 9 mg/dL (ref 6–20)
CHLORIDE: 105 mmol/L (ref 101–111)
CO2: 29 mmol/L (ref 22–32)
Calcium: 9.2 mg/dL (ref 8.9–10.3)
Creatinine, Ser: 0.7 mg/dL (ref 0.44–1.00)
GFR calc Af Amer: >60 mL/min (ref >60)
GFR calc non Af Amer: >60 mL/min (ref >60)
GLUCOSE: 101 mg/dL — AB (ref 65–99)
POTASSIUM: 3.7 mmol/L (ref 3.5–5.1)
Sodium: 140 mmol/L (ref 135–145)
TOTAL PROTEIN: 7.2 g/dL (ref 6.5–8.1)

## 2017-04-21 LAB — CBC WITH DIFFERENTIAL/PLATELET
BASOS ABS: 0.1 10*3/uL (ref 0–0.1)
BASOS PCT: 1 %
Eosinophils Absolute: 0.3 10*3/uL (ref 0–0.7)
Eosinophils Relative: 3 %
HEMATOCRIT: 36.9 % (ref 35.0–47.0)
Hemoglobin: 12.4 g/dL (ref 12.0–16.0)
Lymphocytes Relative: 32 %
Lymphs Abs: 3.4 10*3/uL (ref 1.0–3.6)
MCH: 23.4 pg — ABNORMAL LOW (ref 26.0–34.0)
MCHC: 33.6 g/dL (ref 32.0–36.0)
MCV: 69.8 fL — ABNORMAL LOW (ref 80.0–100.0)
MONO ABS: 0.8 10*3/uL (ref 0.2–0.9)
Monocytes Relative: 7 %
NEUTROS ABS: 6 10*3/uL (ref 1.4–6.5)
NEUTROS PCT: 57 %
Platelets: 237 10*3/uL (ref 150–440)
RBC: 5.29 MIL/uL — AB (ref 3.80–5.20)
RDW: 14.7 % — AB (ref 11.5–14.5)
WBC: 10.6 10*3/uL (ref 3.6–11.0)

## 2017-04-21 MED ORDER — HEPARIN SOD (PORK) LOCK FLUSH 100 UNIT/ML IV SOLN
500.0000 [IU] | Freq: Once | INTRAVENOUS | Status: AC
  Administered 2017-04-21: 500 [IU] via INTRAVENOUS
  Filled 2017-04-21: qty 5

## 2017-04-21 MED ORDER — SODIUM CHLORIDE 0.9% FLUSH
10.0000 mL | Freq: Once | INTRAVENOUS | Status: AC
  Administered 2017-04-21: 10 mL via INTRAVENOUS
  Filled 2017-04-21: qty 10

## 2017-04-21 MED ORDER — SODIUM CHLORIDE 0.9% FLUSH
10.0000 mL | INTRAVENOUS | Status: DC | PRN
  Administered 2017-04-21: 10 mL via INTRAVENOUS
  Filled 2017-04-21: qty 10

## 2017-04-21 NOTE — Progress Notes (Signed)
Patient here today for follow up and treatment consideration regarding breast cancer. Patient here today for first treatment with AC. Patient reports she is seeing Ortho. Surgeon on Thursday of this week regarding fracture to her right hand, wishes to pursue chemo and stay on course at this time. Patient reports pain is 5/10 this morning, has been using Tramadol.

## 2017-04-21 NOTE — Progress Notes (Signed)
Per MD, Dr. Grayland Ormond, order. No treatment today, discharge patient to home. Patient to go for dye study tomorrow, 10/3, and return to clinic on Friday, 10/5, for treatment.

## 2017-04-22 ENCOUNTER — Ambulatory Visit
Admission: RE | Admit: 2017-04-22 | Discharge: 2017-04-22 | Disposition: A | Discharge status: Home / Self Care | Payer: BLUE CROSS/BLUE SHIELD | Source: Ambulatory Visit | Attending: Oncology | Admitting: Oncology

## 2017-04-22 ENCOUNTER — Telehealth: Payer: Self-pay | Admitting: *Deleted

## 2017-04-22 ENCOUNTER — Ambulatory Visit: Payer: BLUE CROSS/BLUE SHIELD

## 2017-04-22 DIAGNOSIS — Z95828 Presence of other vascular implants and grafts: Secondary | ICD-10-CM | POA: Diagnosis present

## 2017-04-22 MED ORDER — SODIUM CHLORIDE FLUSH 0.9 % IV SOLN
INTRAVENOUS | Status: AC
  Filled 2017-04-22: qty 20

## 2017-04-22 MED ORDER — HEPARIN SOD (PORK) LOCK FLUSH 100 UNIT/ML IV SOLN
INTRAVENOUS | Status: AC
  Filled 2017-04-22: qty 5

## 2017-04-22 MED ORDER — IOPAMIDOL (ISOVUE-300) INJECTION 61%
10.0000 mL | Freq: Once | INTRAVENOUS | Status: AC | PRN
  Administered 2017-04-22: 10 mL

## 2017-04-22 NOTE — Telephone Encounter (Signed)
Call placed to pt to discuss results of port dye study, results were called to Dr. Grayland Ormond by radiology. Port needs to be revised prior to administration of chemotherapy, I have contacted Raquel Sarna at [facility] regarding port revision. Pt has been notified of results, will contact patient with updated appointments once we know more about plans for port revision.

## 2017-04-23 ENCOUNTER — Ambulatory Visit: Payer: Self-pay | Admitting: General Surgery

## 2017-04-23 NOTE — Progress Notes (Signed)
  Oncology Nurse Navigator Documentation  Navigator Location: [facility] (04/23/17 1400)   )Navigator Encounter Type: Clinic/MDC (04/23/17 1400)                       Treatment Phase: First Chemo Tx (04/23/17 1400)                            Time Spent with Patient: 15 (04/23/17 1400)   Met patient Tuesday in the infusion room for her first chemo.  She was waiting to have a dye study since they could not get a blood return.  Offered support.  She is to call if she has any questions or needs.

## 2017-04-23 NOTE — H&P (Signed)
  Destiny Rogers is a 56 y.o.female patient with right great cancer who had Port a Cath insertion on 04/15/17. Catheter has adequate inflow but no backflow. After disussion with oncologist he refers that they need to be able to withdraw blood due to infusion of Adryamin.  Patient denies any change since last visit. Patient is not complaining of shortness of breath or chest pain. Patient denies taking any blood thinner or anticoagulants. Patient refers is able to move all her extremities without any problem.    PAST MEDICAL HISTORY:      Past Medical History:  Diagnosis Date  . Depression, unspecified   . Hyperlipidemia, unspecified, unspecified   . Hypertension   . Migraine    .     PAST SURGICAL HISTORY:        Past Surgical History:  Procedure Laterality Date  . CESAREAN SECTION    . DILATION AND CURETTAGE, DIAGNOSTIC / THERAPEUTIC  2008   and uterine ablation - Dr. Laurey Morale  . TUBAL LIGATION Insertion of Por A Cath     .        MEDICATIONS:  EncounterMedications        Outpatient Encounter Prescriptions as of 04/07/2017  Medication Sig Dispense Refill  . bisoprolol-hydrochlorothiazide (ZIAC) 5-6.25 mg tablet TAKE 1 TABLET BY MOUTH ONCE DAILY. 30 tablet 11  . buPROPion (WELLBUTRIN SR) 150 MG SR tablet Take 1 tablet (150 mg total) by mouth 2 (two) times daily. 60 tablet 11   No facility-administered encounter medications on file as of 04/07/2017.     Marland Kitchen   ALLERGIES:   Norco [hydrocodone-acetaminophen] and Penicillins.   SOCIAL HISTORY:  Social History        Social History  . Marital status: Single    Spouse name: N/A  . Number of children: N/A  . Years of education: N/A       Social History Main Topics  . Smoking status: Current Every Day Smoker  . Smokeless tobacco: Never Used  . Alcohol use No  . Drug use: Unknown  . Sexual activity: Not Asked       Other Topics Concern  . None      Social History Narrative  . None  .  FAMILY HISTORY:        Family History  Problem Relation Age of Onset  . High blood pressure (Hypertension) Mother   . Diabetes type II Father   . Lung cancer Father   . Diabetes type II Sister   . Alzheimer's disease Maternal Grandfather    .  GENERAL REVIEW OF SYSTEMS:   PHYSICAL EXAM:     Vitals:   04/07/17 0750  BP: 150/85  Pulse: 88  Temp: 36.4 C (97.6 F)  .  Ht:165.1 cm (5\' 5" ) Wt:84.8 kg (186 lb 14.4 oz) ZDG:UYQI surface area is 1.97 meters squared. Body mass index is 31.1 kg/m.Marland Kitchen    PHYSICAL EXAM: General: alert, active, oriented x3. No distress. A little nervous Heart: regular rate     IMPRESSION AND PLAN:     Patient with known diagnosis of breast cancer of the right breast. The patient was oriented about the problem with the catheter and oriented that it needs to be change. Patient oriented about the risk of surgery. Will proceed with Port a Cath exchange  Patient verbalized understanding, all questions were answered, and were agreeable with the plan outlined above.   Herbert Pun, MD  Electronically signed by Herbert Pun, MD

## 2017-04-24 ENCOUNTER — Ambulatory Visit: Payer: Self-pay | Admitting: General Surgery

## 2017-04-24 ENCOUNTER — Inpatient Hospital Stay: Payer: BLUE CROSS/BLUE SHIELD

## 2017-04-26 MED ORDER — VANCOMYCIN HCL IN DEXTROSE 1-5 GM/200ML-% IV SOLN
1000.0000 mg | INTRAVENOUS | Status: AC
  Administered 2017-04-27: 1000 mg via INTRAVENOUS

## 2017-04-26 NOTE — Progress Notes (Signed)
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  INTERVAL HISTORY: Patient returns to clinic today for further evaluation and initiation of cycle 1 of 4 of Adriamycin and Cytoxan. Patient did not receive treatment last week secondary to a needed revision of her port. She has some mild tenderness at her port site, but otherwise feels well. She has no neurologic complaints. She denies any pain. She has a good appetite and denies weight loss. She has no chest pain or shortness of breath. She denies any nausea, vomiting, constipation, or diarrhea. She has no urinary complaints. Patient offers no further specific complaints today.  REVIEW OF SYSTEMS:   Review of Systems  Constitutional: Negative.  Negative for fever, malaise/fatigue and weight loss.  Respiratory: Negative.  Negative for cough and shortness of breath.   Cardiovascular: Negative.  Negative for chest pain and leg swelling.  Gastrointestinal: Negative.  Negative for abdominal pain.  Genitourinary: Negative.   Musculoskeletal: Negative.   Skin: Negative for rash.  Neurological: Negative.  Negative for sensory change and weakness.  Psychiatric/Behavioral: The patient is nervous/anxious.     As per HPI. Otherwise, a complete review of systems is negative.  PAST MEDICAL HISTORY: Past Medical History:  Diagnosis Date  . Arthritis   . Cancer ([place])   . GERD (gastroesophageal reflux disease)    NO MEDS  . Heart murmur   . Hypertension   . Migraines   . Thumb fracture    LEFT-WEARING BRACE    PAST SURGICAL HISTORY: Past Surgical History:  Procedure Laterality Date  . BREAST BIOPSY Right 03/19/2017   US guided biopsy of  right breast and right lymph node  . CESAREAN SECTION    . FRACTURE SURGERY     left hand  . PILONIDAL CYST EXCISION  1980'S  . PORT-A-CATH REMOVAL N/A 04/27/2017   Procedure: REMOVAL OF PORT A CATH & insertion PORT A CATH LEFT INTERNAL JUGULAR;  Surgeon: Herbert Pun, MD;  Location: [facility] ORS;  Service: General;  Laterality: N/A;  . PORTACATH PLACEMENT N/A 04/15/2017   Procedure: INSERTION PORT-A-CATH;  Surgeon: Herbert Pun, MD;  Location: [facility] ORS;  Service: General;  Laterality: N/A;    FAMILY HISTORY: Family History  Problem Relation Age of Onset  . Breast cancer Maternal Grandmother   . Hypertension Maternal Grandmother   . Angina Mother   . Lung cancer Father   . Diabetes Father   . Prostate cancer Maternal Uncle   . Dementia Maternal Grandfather   . Congestive Heart Failure Maternal Grandfather   . Heart disease Paternal Grandmother     ADVANCED DIRECTIVES (Y/N):  N  HEALTH MAINTENANCE: Social History  Substance Use Topics  . Smoking status: Current Every Day Smoker    Packs/day: 2.00    Years: 18.00    Types: Cigarettes  . Smokeless tobacco: Never Used     Comment: PT IS NOW DOWN TO 1/2 PACK SINCE CANCER DIAGNOSIS   . Alcohol use No     Colonoscopy:  PAP:  Bone density:  Lipid panel:  Allergies  Allergen Reactions  . Penicillins Swelling and Other (See Comments)    Throat swelling Has patient had a PCN reaction causing immediate rash, facial/tongue/throat swelling, SOB or lightheadedness with  hypotension: Yes Has patient had a PCN reaction causing severe rash involving mucus membranes or skin necrosis: No Has patient had a PCN reaction that required hospitalization: No Has patient had a PCN reaction occurring within the last 10 years: No If all of the above answers are "NO", then may proceed with Cephalosporin use.   Marland Kitchen Hydrocodone-Acetaminophen Itching and Rash    Current Outpatient Prescriptions  Medication Sig Dispense Refill  .  acetaminophen (TYLENOL) 500 MG tablet Take 1,000 mg by mouth every 8 (eight) hours as needed for mild pain.    . bisoprolol-hydrochlorothiazide (ZIAC) 5-6.25 MG tablet TAKE 1 TABLET BY MOUTH ONCE DAILY-AM    . buPROPion (WELLBUTRIN SR) 200 MG 12 hr tablet Take 200 mg by mouth 2 (two) times daily.    Marland Kitchen lidocaine-prilocaine (EMLA) cream Apply to affected area once (Patient taking differently: Apply 1 application topically See admin instructions. Apply to port 2 hours prior to chemotherapy) 30 g 3  . ondansetron (ZOFRAN) 8 MG tablet Take 1 tablet (8 mg total) by mouth 2 (two) times daily as needed. (Patient taking differently: Take 8 mg by mouth 2 (two) times daily as needed for nausea or vomiting. ) 60 tablet 2  . prochlorperazine (COMPAZINE) 10 MG tablet Take 1 tablet (10 mg total) by mouth every 6 (six) hours as needed (Nausea or vomiting). 60 tablet 2  . traMADol (ULTRAM) 50 MG tablet Take 50 mg by mouth daily.      No current facility-administered medications for this visit.    Facility-Administered Medications Ordered in Other Visits  Medication Dose Route Frequency Provider Last Rate Last Dose  . cyclophosphamide (CYTOXAN) 1,180 mg in sodium chloride 0.9 % 250 mL chemo infusion  600 mg/m2 (Order-Specific) Intravenous Once Lloyd Huger, MD      . DOXOrubicin (ADRIAMYCIN) chemo injection 118 mg  60 mg/m2 (Order-Specific) Intravenous Once Lloyd Huger, MD      . fosaprepitant (EMEND) 150 mg, dexamethasone (DECADRON) 12 mg in sodium chloride 0.9 % 145 mL IVPB   Intravenous Once Lloyd Huger, MD 454 mL/hr at 04/28/17 1031    . heparin lock flush 100 unit/mL  500 Units Intracatheter Once PRN Lloyd Huger, MD      . pegfilgrastim (NEULASTA ONPRO KIT) injection 6 mg  6 mg Subcutaneous Once Lloyd Huger, MD        OBJECTIVE: Vitals:   04/28/17 0943  BP: (!) 152/86  Pulse: 71  Resp: 18  Temp: 98.4 F (36.9 C)     Body mass index is 32.82 kg/m.    ECOG FS:0 -  Asymptomatic  General: Well-developed, well-nourished, no acute distress. Eyes: Pink conjunctiva, anicteric sclera. Breasts: Patient requested exam be deferred today. Lungs: Clear to auscultation bilaterally. Heart: Regular rate and rhythm. No rubs, murmurs, or gallops. Abdomen: Soft, nontender, nondistended. No organomegaly noted, normoactive bowel sounds. Musculoskeletal: No edema, cyanosis, or clubbing. Neuro: Alert, answering all questions appropriately. Cranial nerves grossly intact. Skin: No rashes or petechiae noted. Psych: Normal affect.   LAB RESULTS:  Lab Results  Component Value Date   NA 140 04/28/2017   K 4.1 04/28/2017   CL 107 04/28/2017   CO2 25 04/28/2017   GLUCOSE 107 (H) 04/28/2017   BUN 14 04/28/2017   CREATININE 0.74 04/28/2017   CALCIUM 9.4 04/28/2017   PROT 6.8 04/28/2017   ALBUMIN 3.6 04/28/2017   AST 20 04/28/2017   ALT 15 04/28/2017   ALKPHOS 105 04/28/2017   BILITOT 0.4 04/28/2017  GFRNONAA >60 04/28/2017   GFRAA >60 04/28/2017    Lab Results  Component Value Date   WBC 22.7 (H) 04/28/2017   NEUTROABS 18.8 (H) 04/28/2017   HGB 12.0 04/28/2017   HCT 37.4 04/28/2017   MCV 71.2 (L) 04/28/2017   PLT 241 04/28/2017     STUDIES: Ct Chest W Contrast  Result Date: 04/13/2017 CLINICAL DATA:  New diagnosis of right breast cancer. EXAM: CT CHEST, ABDOMEN, AND PELVIS WITH CONTRAST TECHNIQUE: Multidetector CT imaging of the chest, abdomen and pelvis was performed following the standard protocol during bolus administration of intravenous contrast. CONTRAST:  136m ISOVUE-300 IOPAMIDOL (ISOVUE-300) INJECTION 61% COMPARISON:  None. FINDINGS: CT CHEST FINDINGS Cardiovascular: The heart is normal in size. No pericardial effusion. The aorta and branch vessels are normal. Mediastinum/Nodes: No mediastinal or hilar mass or lymphadenopathy. Esophagus is grossly normal. Lungs/Pleura: No worrisome pulmonary lesions or pulmonary nodules to suggest metastatic  disease. Mild emphysematous changes with largely paraseptal emphysema. No infiltrates, edema or effusions. No interstitial lung disease or bronchiectasis. Musculoskeletal: 2.5 cm right breast mass containing a biopsy clip. No supraclavicular or axillary lymphadenopathy. Small scattered lymph nodes are noted bilaterally. The thyroid gland appears normal. No significant bony findings no evidence of osseous metastatic disease. CT ABDOMEN PELVIS FINDINGS Hepatobiliary: No focal hepatic lesions to suggest metastatic disease. The gallbladder is normal. No common bile duct dilatation. Pancreas: No mass, inflammation or ductal dilatation. Spleen: Normal size.  No focal lesions. Adrenals/Urinary Tract: The adrenal glands and kidneys are unremarkable. The bladder is normal. Stomach/Bowel: The stomach, duodenum, small bowel and colon are normal. No inflammatory changes, mass lesions or obstructive findings. The terminal ileum is normal. The appendix is normal. Vascular/Lymphatic: Moderate, age advanced, distal aortic and iliac artery atherosclerotic calcifications. No aneurysm or dissection. The branch vessels are patent. The major venous structures are patent. No mesenteric or retroperitoneal mass or lymphadenopathy. Reproductive: Normal uterus and ovaries. Other: No pelvic mass or adenopathy. No free pelvic fluid collections. No inguinal mass or adenopathy. No abdominal wall hernia or subcutaneous lesions. Musculoskeletal: No lytic or sclerotic bone lesions to suggest metastasis. Advanced facet disease in the lower lumbar spine is noted along with advanced degenerate disc disease at L5-S1. IMPRESSION: 1. Right breast mass but no enlarged supraclavicular or axillary lymph nodes. 2. No CT findings for metastatic disease involving the chest, abdomen or pelvis. 3. Emphysematous changes but no acute pulmonary findings. 4. No findings to suggest osseous metastatic disease. 5. Age advanced atherosclerotic calcifications involving  the distal aorta and iliac arteries. Electronically Signed   By: PMarijo SanesM.D.   On: 04/13/2017 14:52   Nm Cardiac Muga Rest  Result Date: 04/13/2017 CLINICAL DATA:  RIGHT breast cancer, pre cardiotoxic chemotherapy EXAM: NUCLEAR MEDICINE CARDIAC BLOOD POOL IMAGING (MUGA) TECHNIQUE: Cardiac multi-gated acquisition was performed at rest following intravenous injection of Tc-951mabeled red blood cells. RADIOPHARMACEUTICALS:  20.92 mCi [id number] in-vitro labeled autologous red blood cells IV COMPARISON:  None FINDINGS: LEFT ventricular ejection fraction is calculated at 53%. Study was obtained at a heart rate of 70 beats per minute. Patient was rhythmic during imaging. No focal wall motion abnormalities identified. IMPRESSION: LEFT ventricular ejection fraction of 53%. No focal wall motion abnormalities. Electronically Signed   By: MarLavonia DanaD.   On: 04/13/2017 12:31   Ct Abdomen Pelvis W Contrast  Result Date: 04/13/2017 CLINICAL DATA:  New diagnosis of right breast cancer. EXAM: CT CHEST, ABDOMEN, AND PELVIS WITH CONTRAST TECHNIQUE: Multidetector CT imaging of the  chest, abdomen and pelvis was performed following the standard protocol during bolus administration of intravenous contrast. CONTRAST:  130m ISOVUE-300 IOPAMIDOL (ISOVUE-300) INJECTION 61% COMPARISON:  None. FINDINGS: CT CHEST FINDINGS Cardiovascular: The heart is normal in size. No pericardial effusion. The aorta and branch vessels are normal. Mediastinum/Nodes: No mediastinal or hilar mass or lymphadenopathy. Esophagus is grossly normal. Lungs/Pleura: No worrisome pulmonary lesions or pulmonary nodules to suggest metastatic disease. Mild emphysematous changes with largely paraseptal emphysema. No infiltrates, edema or effusions. No interstitial lung disease or bronchiectasis. Musculoskeletal: 2.5 cm right breast mass containing a biopsy clip. No supraclavicular or axillary lymphadenopathy. Small scattered lymph nodes are  noted bilaterally. The thyroid gland appears normal. No significant bony findings no evidence of osseous metastatic disease. CT ABDOMEN PELVIS FINDINGS Hepatobiliary: No focal hepatic lesions to suggest metastatic disease. The gallbladder is normal. No common bile duct dilatation. Pancreas: No mass, inflammation or ductal dilatation. Spleen: Normal size.  No focal lesions. Adrenals/Urinary Tract: The adrenal glands and kidneys are unremarkable. The bladder is normal. Stomach/Bowel: The stomach, duodenum, small bowel and colon are normal. No inflammatory changes, mass lesions or obstructive findings. The terminal ileum is normal. The appendix is normal. Vascular/Lymphatic: Moderate, age advanced, distal aortic and iliac artery atherosclerotic calcifications. No aneurysm or dissection. The branch vessels are patent. The major venous structures are patent. No mesenteric or retroperitoneal mass or lymphadenopathy. Reproductive: Normal uterus and ovaries. Other: No pelvic mass or adenopathy. No free pelvic fluid collections. No inguinal mass or adenopathy. No abdominal wall hernia or subcutaneous lesions. Musculoskeletal: No lytic or sclerotic bone lesions to suggest metastasis. Advanced facet disease in the lower lumbar spine is noted along with advanced degenerate disc disease at L5-S1. IMPRESSION: 1. Right breast mass but no enlarged supraclavicular or axillary lymph nodes. 2. No CT findings for metastatic disease involving the chest, abdomen or pelvis. 3. Emphysematous changes but no acute pulmonary findings. 4. No findings to suggest osseous metastatic disease. 5. Age advanced atherosclerotic calcifications involving the distal aorta and iliac arteries. Electronically Signed   By: PMarijo SanesM.D.   On: 04/13/2017 14:52   Dg Chest Port 1 View  Result Date: 04/27/2017 CLINICAL DATA:  Port-A-Cath placement EXAM: PORTABLE CHEST 1 VIEW COMPARISON:  04/15/2017 FINDINGS: Left Port-A-Cath in place with the tip in the  lower SVC near the cavoatrial junction. No pneumothorax. Heart and mediastinal contours are within normal limits. No focal opacities or effusions. No acute bony abnormality. IMPRESSION: Left Port-A-Cath tip in the lower SVC. No pneumothorax. No active disease. Electronically Signed   By: KRolm BaptiseM.D.   On: 04/27/2017 11:07   Dg Chest Port 1 View  Result Date: 04/15/2017 CLINICAL DATA:  Status post port insertion EXAM: PORTABLE CHEST 1 VIEW COMPARISON:  None. FINDINGS: Cardiac shadow is within normal limits. New left chest wall port is noted with the catheter tip at the proximal superior vena cava. No pneumothorax is noted. No infiltrate is seen. No bony abnormality is noted. IMPRESSION: No pneumothorax following port placement Electronically Signed   By: MInez CatalinaM.D.   On: 04/15/2017 09:51   Dg Fluoro Guide Cv Line Left  Result Date: 04/22/2017 CLINICAL DATA:  Recently placed left chest Port-A-Cath. The Port-A-Cath will not aspirate. EXAM: PORT-A-CATH INJECTION WITH FLUOROSCOPY TECHNIQUE: The left chest Port-A-Cath was accessed using sterile technique. FLUOROSCOPY TIME:  Fluoroscopy Time:  30 seconds COMPARISON:  Intraoperative images 04/15/2017 FINDINGS: There is a left jugular Port-A-Cath with the tip at the central aspect of the  left innominate vein. Tip is near the junction of the left innominate vein and SVC. The Port-A-Cath flushes without difficulty but cannot aspirate blood from the catheter. Port injection demonstrates a fibrin sheath at the tip of the catheter. The port is intact without discontinuity or fracture. IMPRESSION: Fibrin sheath at the tip of the catheter. The catheter tip is located in the central aspect of the left innominate vein near the junction of the left innominate vein and SVC. Recommend revision of the Port-A-Cath with placement of the catheter tip at the superior cavoatrial junction. Electronically Signed   By: Markus Daft M.D.   On: 04/22/2017 14:18   Dg C-arm 1-60  Min-no Report  Result Date: 04/27/2017 Fluoroscopy was utilized by the requesting physician.  No radiographic interpretation.   Dg C-arm 1-60 Min-no Report  Result Date: 04/15/2017 Fluoroscopy was utilized by the requesting physician.  No radiographic interpretation.    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  PLAN:    1. 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 After lengthy discussion with the patient, she is highly interested in breast conservation and wants to minimize the amount of surgery. Because of this, will proceed with neoadjuvant chemotherapy using Adriamycin, Cytoxan, and Taxol with Neulasta support. Once patient completes her chemotherapy, she will require lumpectomy followed by XRT as well as an aromatase inhibitor for 5 years. CT scan from April 13, 2017 did not reveal any metastatic disease. Pretreatment MUGA is adequate to proceed with an EF of 53%. Proceed with cycle 1 of 4 of dose dense Adriamycin and Cytoxan with Neulasta support today. Return to clinic in 1 week for laboratory work and further evaluation and then in 2 weeks for consideration of cycle 2. 2. Right hand fracture: Continue monitoring treatment per orthopedics. 3. Leukocytosis: Likely reactive secondary to poor revision yesterday.  Approximately 30 minutes was spent in discussion of which greater than 50% was consultation.  Patient expressed understanding and was in agreement with this plan. She also understands that She can call clinic at any time with any questions, concerns, or complaints.   Cancer Staging Primary cancer of upper outer quadrant of right female breast [facility]) Staging form: Breast, AJCC 8th Edition - Clinical stage from 03/27/2017: Stage  IB (cT1c, cN1, cM0, G2, ER: Positive, PR: Positive, HER2: Negative) - Signed by Lloyd Huger, MD on 03/27/2017   Lloyd Huger, MD   04/28/2017 10:40 AM

## 2017-04-27 ENCOUNTER — Ambulatory Visit
Admission: RE | Admit: 2017-04-27 | Discharge: 2017-04-27 | Disposition: A | Discharge status: Home / Self Care | Payer: BLUE CROSS/BLUE SHIELD | Source: Ambulatory Visit | Attending: General Surgery | Admitting: General Surgery

## 2017-04-27 ENCOUNTER — Encounter
Admission: RE | Disposition: A | Discharge status: Home / Self Care | Payer: Self-pay | Source: Ambulatory Visit | Attending: General Surgery

## 2017-04-27 ENCOUNTER — Ambulatory Visit: Payer: BLUE CROSS/BLUE SHIELD | Admitting: Anesthesiology

## 2017-04-27 ENCOUNTER — Encounter: Payer: Self-pay | Admitting: Emergency Medicine

## 2017-04-27 ENCOUNTER — Ambulatory Visit: Payer: BLUE CROSS/BLUE SHIELD

## 2017-04-27 DIAGNOSIS — Z79899 Other long term (current) drug therapy: Secondary | ICD-10-CM | POA: Diagnosis not present

## 2017-04-27 DIAGNOSIS — Z880 Allergy status to penicillin: Secondary | ICD-10-CM | POA: Diagnosis not present

## 2017-04-27 DIAGNOSIS — F17210 Nicotine dependence, cigarettes, uncomplicated: Secondary | ICD-10-CM | POA: Diagnosis not present

## 2017-04-27 DIAGNOSIS — F329 Major depressive disorder, single episode, unspecified: Secondary | ICD-10-CM | POA: Diagnosis not present

## 2017-04-27 DIAGNOSIS — C50911 Malignant neoplasm of unspecified site of right female breast: Secondary | ICD-10-CM | POA: Diagnosis not present

## 2017-04-27 DIAGNOSIS — T82598A Other mechanical complication of other cardiac and vascular devices and implants, initial encounter: Secondary | ICD-10-CM | POA: Diagnosis present

## 2017-04-27 DIAGNOSIS — Y848 Other medical procedures as the cause of abnormal reaction of the patient, or of later complication, without mention of misadventure at the time of the procedure: Secondary | ICD-10-CM | POA: Insufficient documentation

## 2017-04-27 DIAGNOSIS — I10 Essential (primary) hypertension: Secondary | ICD-10-CM | POA: Diagnosis not present

## 2017-04-27 DIAGNOSIS — Y9289 Other specified places as the place of occurrence of the external cause: Secondary | ICD-10-CM | POA: Diagnosis not present

## 2017-04-27 DIAGNOSIS — Z888 Allergy status to other drugs, medicaments and biological substances status: Secondary | ICD-10-CM | POA: Diagnosis not present

## 2017-04-27 DIAGNOSIS — E785 Hyperlipidemia, unspecified: Secondary | ICD-10-CM | POA: Diagnosis not present

## 2017-04-27 DIAGNOSIS — C50411 Malignant neoplasm of upper-outer quadrant of right female breast: Secondary | ICD-10-CM

## 2017-04-27 HISTORY — PX: PORT-A-CATH REMOVAL: SHX5289

## 2017-04-27 SURGERY — REMOVAL PORT-A-CATH
Anesthesia: General

## 2017-04-27 MED ORDER — DEXAMETHASONE SODIUM PHOSPHATE 10 MG/ML IJ SOLN
INTRAMUSCULAR | Status: DC | PRN
  Administered 2017-04-27: 10 mg via INTRAVENOUS

## 2017-04-27 MED ORDER — LACTATED RINGERS IV SOLN
INTRAVENOUS | Status: DC
  Administered 2017-04-27: 08:00:00 via INTRAVENOUS

## 2017-04-27 MED ORDER — TRAMADOL HCL 50 MG PO TABS
ORAL_TABLET | ORAL | Status: AC
  Administered 2017-04-27: 50 mg via ORAL
  Filled 2017-04-27: qty 1

## 2017-04-27 MED ORDER — FAMOTIDINE 20 MG PO TABS
ORAL_TABLET | ORAL | Status: AC
  Administered 2017-04-27: 20 mg via ORAL
  Filled 2017-04-27: qty 1

## 2017-04-27 MED ORDER — LIDOCAINE HCL (PF) 1 % IJ SOLN
INTRAMUSCULAR | Status: DC | PRN
  Administered 2017-04-27: 5 mL

## 2017-04-27 MED ORDER — KETOROLAC TROMETHAMINE 30 MG/ML IJ SOLN
INTRAMUSCULAR | Status: DC | PRN
  Administered 2017-04-27: 30 mg via INTRAVENOUS

## 2017-04-27 MED ORDER — TRAMADOL HCL 50 MG PO TABS
50.0000 mg | ORAL_TABLET | Freq: Once | ORAL | Status: AC
  Administered 2017-04-27: 50 mg via ORAL

## 2017-04-27 MED ORDER — FENTANYL CITRATE (PF) 100 MCG/2ML IJ SOLN
INTRAMUSCULAR | Status: DC | PRN
  Administered 2017-04-27: 25 ug via INTRAVENOUS
  Administered 2017-04-27: 100 ug via INTRAVENOUS

## 2017-04-27 MED ORDER — PHENYLEPHRINE HCL 10 MG/ML IJ SOLN
INTRAMUSCULAR | Status: DC | PRN
  Administered 2017-04-27 (×2): 200 ug via INTRAVENOUS

## 2017-04-27 MED ORDER — FENTANYL CITRATE (PF) 100 MCG/2ML IJ SOLN
25.0000 ug | INTRAMUSCULAR | Status: DC | PRN
  Administered 2017-04-27 (×2): 25 ug via INTRAVENOUS

## 2017-04-27 MED ORDER — FAMOTIDINE 20 MG PO TABS
20.0000 mg | ORAL_TABLET | Freq: Once | ORAL | Status: AC
  Administered 2017-04-27: 20 mg via ORAL

## 2017-04-27 MED ORDER — MIDAZOLAM HCL 2 MG/2ML IJ SOLN
INTRAMUSCULAR | Status: AC
Start: 2017-04-27 — End: ?
  Filled 2017-04-27: qty 2

## 2017-04-27 MED ORDER — LIDOCAINE HCL (PF) 1 % IJ SOLN
INTRAMUSCULAR | Status: AC
  Filled 2017-04-27: qty 30

## 2017-04-27 MED ORDER — SODIUM CHLORIDE 0.9 % IJ SOLN
INTRAMUSCULAR | Status: AC
  Filled 2017-04-27: qty 50

## 2017-04-27 MED ORDER — FENTANYL CITRATE (PF) 100 MCG/2ML IJ SOLN
INTRAMUSCULAR | Status: AC
  Administered 2017-04-27: 25 ug via INTRAVENOUS
  Filled 2017-04-27: qty 2

## 2017-04-27 MED ORDER — VANCOMYCIN HCL IN DEXTROSE 1-5 GM/200ML-% IV SOLN
INTRAVENOUS | Status: AC
  Administered 2017-04-27: 1000 mg via INTRAVENOUS
  Filled 2017-04-27: qty 200

## 2017-04-27 MED ORDER — KETOROLAC TROMETHAMINE 30 MG/ML IJ SOLN
INTRAMUSCULAR | Status: AC
  Filled 2017-04-27: qty 1

## 2017-04-27 MED ORDER — HEPARIN SODIUM (PORCINE) 1000 UNIT/ML IJ SOLN
INTRAMUSCULAR | Status: DC | PRN
  Administered 2017-04-27: 9 mL via INTRAMUSCULAR

## 2017-04-27 MED ORDER — FENTANYL CITRATE (PF) 250 MCG/5ML IJ SOLN
INTRAMUSCULAR | Status: AC
  Filled 2017-04-27: qty 5

## 2017-04-27 MED ORDER — PROPOFOL 500 MG/50ML IV EMUL
INTRAVENOUS | Status: DC | PRN
  Administered 2017-04-27: 75 ug/kg/min via INTRAVENOUS

## 2017-04-27 MED ORDER — MIDAZOLAM HCL 2 MG/2ML IJ SOLN
INTRAMUSCULAR | Status: DC | PRN
Start: 2017-04-27 — End: 2017-04-27
  Administered 2017-04-27: 2 mg via INTRAVENOUS

## 2017-04-27 MED ORDER — PROPOFOL 10 MG/ML IV BOLUS
INTRAVENOUS | Status: DC | PRN
  Administered 2017-04-27: 50 mg via INTRAVENOUS

## 2017-04-27 MED ORDER — HEPARIN SODIUM (PORCINE) 5000 UNIT/ML IJ SOLN
INTRAMUSCULAR | Status: AC
  Filled 2017-04-27: qty 1

## 2017-04-27 MED ORDER — DEXAMETHASONE SODIUM PHOSPHATE 10 MG/ML IJ SOLN
INTRAMUSCULAR | Status: AC
  Filled 2017-04-27: qty 1

## 2017-04-27 MED ORDER — ONDANSETRON HCL 4 MG/2ML IJ SOLN
INTRAMUSCULAR | Status: AC
  Filled 2017-04-27: qty 2

## 2017-04-27 MED ORDER — ONDANSETRON HCL 4 MG/2ML IJ SOLN: 4.0000 mg | Freq: Once | INTRAMUSCULAR | Status: DC | PRN

## 2017-04-27 MED ORDER — ONDANSETRON HCL 4 MG/2ML IJ SOLN
INTRAMUSCULAR | Status: DC | PRN
  Administered 2017-04-27: 4 mg via INTRAVENOUS

## 2017-04-27 MED ORDER — PROPOFOL 500 MG/50ML IV EMUL
INTRAVENOUS | Status: AC
  Filled 2017-04-27: qty 50

## 2017-04-27 MED ORDER — PHENYLEPHRINE HCL 10 MG/ML IJ SOLN
INTRAMUSCULAR | Status: AC
  Filled 2017-04-27: qty 1

## 2017-04-27 SURGICAL SUPPLY — 28 items
CANISTER SUCT 1200ML W/VALVE (MISCELLANEOUS) ×3 IMPLANT
CHLORAPREP W/TINT 26ML (MISCELLANEOUS) ×3 IMPLANT
COVER LIGHT HANDLE STERIS (MISCELLANEOUS) ×6 IMPLANT
COVER TRANSDUCER ULTRASOUND (MISCELLANEOUS) ×3 IMPLANT
DERMABOND ADVANCED (GAUZE/BANDAGES/DRESSINGS) ×2
DERMABOND ADVANCED .7 DNX12 (GAUZE/BANDAGES/DRESSINGS) ×1 IMPLANT
DRAPE C-ARM XRAY 36X54 (DRAPES) ×3 IMPLANT
ELECT REM PT RETURN 9FT ADLT (ELECTROSURGICAL) ×3
ELECTRODE REM PT RTRN 9FT ADLT (ELECTROSURGICAL) ×1 IMPLANT
GLOVE BIO SURGEON STRL SZ 6.5 (GLOVE) ×4 IMPLANT
GLOVE BIO SURGEONS STRL SZ 6.5 (GLOVE) ×2
GLOVE INDICATOR 7.0 STRL GRN (GLOVE) ×3 IMPLANT
GOWN STRL REUS W/ TWL LRG LVL3 (GOWN DISPOSABLE) ×2 IMPLANT
GOWN STRL REUS W/TWL LRG LVL3 (GOWN DISPOSABLE) ×4
KIT PORT POWER 8FR ISP CVUE (Miscellaneous) ×3 IMPLANT
KIT RM TURNOVER STRD PROC AR (KITS) ×3 IMPLANT
LABEL OR SOLS (LABEL) ×3 IMPLANT
NEEDLE FILTER BLUNT 18X 1/2SAF (NEEDLE) ×2
NEEDLE FILTER BLUNT 18X1 1/2 (NEEDLE) ×1 IMPLANT
PACK PORT-A-CATH (MISCELLANEOUS) ×3 IMPLANT
SUT MNCRL AB 4-0 PS2 18 (SUTURE) ×3 IMPLANT
SUT SILK 4 0 SH (SUTURE) ×3 IMPLANT
SUT VIC AB 2-0 SH 27 (SUTURE) ×2
SUT VIC AB 2-0 SH 27XBRD (SUTURE) ×1 IMPLANT
SUT VIC AB 3-0 SH 27 (SUTURE) ×2
SUT VIC AB 3-0 SH 27X BRD (SUTURE) ×1 IMPLANT
SYR 3ML LL SCALE MARK (SYRINGE) ×3 IMPLANT
SYRINGE 10CC LL (SYRINGE) ×3 IMPLANT

## 2017-04-27 NOTE — Anesthesia Postprocedure Evaluation (Signed)
Anesthesia Post Note  Patient: MICHALENE DEBRULER  Procedure(s) Performed: REMOVAL OF PORT A CATH & insertion PORT A CATH LEFT INTERNAL JUGULAR (N/A )  Patient location during evaluation: PACU Anesthesia Type: General Level of consciousness: awake and alert Pain management: pain level controlled Vital Signs Assessment: post-procedure vital signs reviewed and stable Respiratory status: spontaneous breathing, nonlabored ventilation, respiratory function stable and patient connected to nasal cannula oxygen Cardiovascular status: blood pressure returned to baseline and stable Postop Assessment: no apparent nausea or vomiting Anesthetic complications: no     Last Vitals:  Vitals:   04/27/17 1109 04/27/17 1152  BP: (!) 163/72 (!) 168/67  Pulse: 61 (!) 57  Resp: 18 18  Temp: 36.6 C   SpO2: 100% 100%    Last Pain:  Vitals:   04/27/17 1110  TempSrc:   PainSc: 6                  Molli Barrows

## 2017-04-27 NOTE — Anesthesia Preprocedure Evaluation (Signed)
Anesthesia Evaluation  Patient identified by MRN, date of birth, ID band Patient awake    Reviewed: Allergy & Precautions, H&P , NPO status , Patient's Chart, lab work & pertinent test results, reviewed documented beta blocker date and time   Airway Mallampati: II   Neck ROM: full    Dental  (+) Teeth Intact   Pulmonary neg pulmonary ROS, Current Smoker,    Pulmonary exam normal        Cardiovascular Exercise Tolerance: Good hypertension, On Medications negative cardio ROS Normal cardiovascular exam+ Valvular Problems/Murmurs  Rhythm:regular Rate:Normal     Neuro/Psych  Headaches, negative neurological ROS  negative psych ROS   GI/Hepatic negative GI ROS, Neg liver ROS, GERD  Medicated,  Endo/Other  negative endocrine ROS  Renal/GU negative Renal ROS  negative genitourinary   Musculoskeletal   Abdominal   Peds  Hematology negative hematology ROS (+)   Anesthesia Other Findings Past Medical History: No date: Arthritis No date: Cancer (HCC) No date: GERD (gastroesophageal reflux disease)     Comment:  NO MEDS No date: Heart murmur No date: Hypertension No date: Migraines No date: Thumb fracture     Comment:  LEFT-WEARING BRACE Past Surgical History: 03/19/2017: BREAST BIOPSY; Right     Comment:  US guided biopsy of right breast and right lymph node No date: CESAREAN SECTION No date: FRACTURE SURGERY     Comment:  left hand 1980'S: PILONIDAL CYST EXCISION 04/15/2017: PORTACATH PLACEMENT; N/A     Comment:  Procedure: INSERTION PORT-A-CATH;  Surgeon:               Herbert Pun, MD;  Location: [facility] ORS;  Service:              General;  Laterality: N/A; BMI    Body Mass Index:  33.06 kg/m     Reproductive/Obstetrics negative OB ROS                             Anesthesia Physical Anesthesia Plan  ASA: III  Anesthesia Plan: General   Post-op Pain Management:     Induction:   PONV Risk Score and Plan: 3 and Ondansetron, Dexamethasone, Midazolam and Propofol infusion  Airway Management Planned:   Additional Equipment:   Intra-op Plan:   Post-operative Plan:   Informed Consent: I have reviewed the patients History and Physical, chart, labs and discussed the procedure including the risks, benefits and alternatives for the proposed anesthesia with the patient or authorized representative who has indicated his/her understanding and acceptance.   Dental Advisory Given  Plan Discussed with: CRNA  Anesthesia Plan Comments:         Anesthesia Quick Evaluation

## 2017-04-27 NOTE — H&P (Signed)
Destiny Rogers is a 56 y.o.female patient with diagnosis of right breast Cancer. Patient presents with malfunction of the catheter due to a fibrin sheath as seen on the contrast study. Catheter has adequate inflow but not able to aspirate blood with is essential for the Chemotherapy, since Adriamycin is given, as discussed with Cardiologist.  Patient denies chest pain, shortness of breath, coughing or any new symptoms.    PAST MEDICAL HISTORY:  Past Medical History:  Diagnosis Date  . Depression, unspecified  . Hyperlipidemia, unspecified, unspecified  . Hypertension  . Migraine   PAST SURGICAL HISTORY:  Past Surgical History:  Procedure Laterality Date  . CESAREAN SECTION  . DILATION AND CURETTAGE, DIAGNOSTIC / THERAPEUTIC 2008  and uterine ablation - Dr. Laurey Morale  . TUBAL LIGATION   MEDICATIONS:  Outpatient Encounter Prescriptions as of 04/07/2017  Medication Sig Dispense Refill  . bisoprolol-hydrochlorothiazide (ZIAC) 5-6.25 mg tablet TAKE 1 TABLET BY MOUTH ONCE DAILY. 30 tablet 11  . buPROPion (WELLBUTRIN SR) 150 MG SR tablet Take 1 tablet (150 mg total) by mouth 2 (two) times daily. 60 tablet 11    ALLERGIES:  Norco [hydrocodone-acetaminophen] and Penicillins.  SOCIAL HISTORY:  Social History   Social History  . Marital status: Single  Spouse name: N/A  . Number of children: N/A  . Years of education: N/A   Social History Main Topics  . Smoking status: Current Every Day Smoker  . Smokeless tobacco: Never Used  . Alcohol use No  . Drug use: Unknown  . Sexual activity: Not Asked   FAMILY HISTORY:  Family History  Problem Relation Age of Onset  . High blood pressure (Hypertension) Mother  . Diabetes type II Father  . Lung cancer Father  . Diabetes type II Sister  . Alzheimer's disease Maternal Grandfather   GENERAL REVIEW OF SYSTEMS:   PHYSICAL EXAM:   Vitals:   04/27/17 0756  BP: 128/80  Pulse: 67  Resp: 18  Temp: 97.9 F (36.6 C)  SpO2: 100%   Ht:165.1  cm (5\' 5" ) Wt:84.8 kg (186 lb 14.4 oz) ZES:PQZR surface area is 1.97 meters squared. Body mass index is 31.1 kg/m.Marland Kitchen  PHYSICAL EXAM: General: alert, active, oriented x3. No distress. A little nervous Heart: regular rate Chest: No wheesings, Wounds well healed.   Abdomen: soft and depressible, non tender, non distended.  Extremities. No edema, no cyanosis.   IMPRESSION AND PLAN:   Patient with known diagnosis of breast cancer of the right breast with recent [facility] Insertion that is malfunctioning.  The patient was oriented about the procedure steps, the benefits of having a Chemo port and the risk. Will proceed to exchange the [employer].   Patient verbalized understanding, all questions were answered, and were agreeable with the plan outlined above.   Herbert Pun, MD  Electronically signed by Herbert Pun, MD

## 2017-04-27 NOTE — OR Nursing (Signed)
Dr Andree Elk Notified about patient taking a beta blocker yesterday. No new orders at this time.

## 2017-04-27 NOTE — Anesthesia Post-op Follow-up Note (Signed)
Anesthesia QCDR form completed.        

## 2017-04-27 NOTE — Transfer of Care (Signed)
Immediate Anesthesia Transfer of Care Note  Patient: Destiny Rogers  Procedure(s) Performed: REMOVAL OF PORT A CATH & insertion PORT A CATH LEFT INTERNAL JUGULAR (N/A )  Patient Location: PACU  Anesthesia Type:General  Level of Consciousness: awake, alert  and oriented  Airway & Oxygen Therapy: Patient Spontanous Breathing  Post-op Assessment: Report given to RN and Post -op Vital signs reviewed and stable  Post vital signs: Reviewed and stable  Last Vitals:  Vitals:   04/27/17 0756  BP: 128/80  Pulse: 67  Resp: 18  Temp: 36.6 C  SpO2: 100%    Last Pain:  Vitals:   04/27/17 0756  TempSrc: Oral  PainSc: 6          Complications: No apparent anesthesia complications

## 2017-04-27 NOTE — Discharge Instructions (Signed)
AMBULATORY SURGERY  DISCHARGE INSTRUCTIONS   1) The drugs that you were given will stay in your system until tomorrow so for the next 24 hours you should not:  A) Drive an automobile B) Make any legal decisions C) Drink any alcoholic beverage   2) You may resume regular meals tomorrow.  Today it is better to start with liquids and gradually work up to solid foods.  You may eat anything you prefer, but it is better to start with liquids, then soup and crackers, and gradually work up to solid foods.   3) Please notify your doctor immediately if you have any unusual bleeding, trouble breathing, redness and pain at the surgery site, drainage, fever, or pain not relieved by medication.    4) Additional Instructions:        Please contact your physician with any problems or Same Day Surgery at [phone], Monday through Friday 6 am to 4 pm, or [place] at [facility] number at [phone]. Diet: Resume home heart healthy regular diet.   Activity: No heavy lifting >20 pounds (children, pets, laundry, garbage) or strenuous activity with left upper extremity until follow-up, but light activity and walking are encouraged. Do not drive or drink alcohol if taking narcotic pain medications.  Wound care: May shower with soapy water and pat dry (do not rub incisions) starting tomorrow, but no baths or submerging incision underwater until follow-up. (no swimming)   Medications: Resume all home medications. For mild to moderate pain: acetaminophen (Tylenol) or ibuprofen (if no kidney disease). Combining Tylenol with alcohol can substantially increase your risk of causing liver disease. Narcotic pain medications, if prescribed, can be used for severe pain, though may cause nausea, constipation, and drowsiness. Do not combine Tylenol and Percocet within a 6 hour period as Percocet contains Tylenol. If you do not need the narcotic pain medication, you do not need to fill the  prescription.  Call office [phone]) at any time if any questions, worsening pain, fevers/chills, bleeding, drainage from incision site, or other concerns.

## 2017-04-27 NOTE — Op Note (Signed)
SURGICAL PROCEDURE REPORT  DATE OF PROCEDURE: 04/27/2017   SURGEON: Dr. Windell Moment   ANESTHESIA: Local with light IV sedation   PRE-OPERATIVE DIAGNOSIS:  Malfunctioning Port A Cath Advanced breast cancer of the right breast requiring durable central venous access for chemotherapy   POST-OPERATIVE DIAGNOSIS:  Malfunctioning Port A Cath Advanced breast cancer of the right breast requiring durable central venous access for chemotherapy   PROCEDURE(S): (cpt: 36561) 1.) Percutaneous access of Left internal jugular vein under ultrasound guidance 2.) Insertion of tunneled Left internal jugular central venous catheter with subcutaneous port  INTRAOPERATIVE FINDINGS: Patent easily compressible Left internal jugular vein with appropriate respiratory variations and well-secured tunneled central venous catheter with subcutaneous port at completion of the procedure  ESTIMATED BLOOD LOSS: Minimal (<20 mL)   SPECIMENS: None   IMPLANTS: 53F tunneled Bard PowerPort  Clear VUE Implantable Port  DRAINS: None   COMPLICATIONS: None apparent   CONDITION AT COMPLETION: Hemodynamically stable, awake   DISPOSITION: PACU   INDICATION(S) FOR PROCEDURE:  Patient is a 56 y.o. female who presented with advanced breast cancer of the right breast with a malfunctioning Chemo Port unable to receive chemotherapy.  Patient needs new Chemo Port to be able to receive proper Chemotherapy. All risks, benefits, and alternatives to above elective procedures were discussed with the patient, who elected to proceed, and informed consent was accordingly obtained at that time.  DETAILS OF PROCEDURE:  Patient was brought to the operative suite and appropriately identified. In Trendelenburg position, Left IJ venous access site was prepped and draped in the usual sterile fashion, and following a brief timeout, percutaneous Left IJ venous access was obtained under ultrasound guidance using Seldinger technique, by which  local anesthetic was injected over the Left IJ vein, and access needle was inserted under direct ultrasound visualization into the Left IJ vein, through which soft guidewire was advanced, over which access needle was withdrawn. Guidewire was secured, attention was directed to injection of local anesthetic along the planned tunnel site, 2-3 cm transverse Left chest incision was made and confirmed to accommodate the subcutaneous port, and flushed catheter was tunneled retrograde from the port site over the Left chest to the Left IJ access site with the attached port well-secured to the catheter and within the subcutaneous pocket. Insertion sheath was advanced over the guidewire, which was withdrawn along with the insertion sheath dilator. Length of catheter needed to position the catheter tip at the atrio-caval junction was then measured under direct fluoroscopic visualization, after which the catheter was cut to the measured length and advanced through the sheath into the Left internal jugular vein and SVC without evidence of cardiac arrhythmias during the procedure. Port was confirmed to withdraw blood and flush easily, after which concentrated heparin was instilled into the port and catheter. Dermis at the subcutaneous pocket was re-approximated using buried interrupted 3-0 Vicryl suture, and 4-0 Monocryl suture was used to re-approximate skin at the insertion and subcutaneous port sites in running subcuticular fashion for the subcutaneous port and buried interrupted fashion for the insertion site. Skin was cleaned, dried, and sterile skin glue was applied. Patient was then safely transferred to PACU for a chest x-ray.

## 2017-04-28 ENCOUNTER — Inpatient Hospital Stay (HOSPITAL_BASED_OUTPATIENT_CLINIC_OR_DEPARTMENT_OTHER): Payer: BLUE CROSS/BLUE SHIELD | Admitting: Oncology

## 2017-04-28 ENCOUNTER — Inpatient Hospital Stay: Payer: BLUE CROSS/BLUE SHIELD

## 2017-04-28 VITALS — BP 152/86 | HR 71 | Temp 98.4°F | Resp 18 | Wt 191.2 lb

## 2017-04-28 DIAGNOSIS — D72829 Elevated white blood cell count, unspecified: Secondary | ICD-10-CM | POA: Diagnosis not present

## 2017-04-28 DIAGNOSIS — Z79899 Other long term (current) drug therapy: Secondary | ICD-10-CM | POA: Diagnosis not present

## 2017-04-28 DIAGNOSIS — C50411 Malignant neoplasm of upper-outer quadrant of right female breast: Secondary | ICD-10-CM | POA: Diagnosis not present

## 2017-04-28 DIAGNOSIS — Z170 Estrogen receptor positive status [ER+]: Secondary | ICD-10-CM | POA: Diagnosis not present

## 2017-04-28 DIAGNOSIS — F17210 Nicotine dependence, cigarettes, uncomplicated: Secondary | ICD-10-CM

## 2017-04-28 LAB — CBC WITH DIFFERENTIAL/PLATELET
BASOS ABS: 0 10*3/uL (ref 0–0.1)
BASOS PCT: 0 %
EOS ABS: 0 10*3/uL (ref 0–0.7)
Eosinophils Relative: 0 %
HEMATOCRIT: 37.4 % (ref 35.0–47.0)
Hemoglobin: 12 g/dL (ref 12.0–16.0)
Lymphocytes Relative: 10 %
Lymphs Abs: 2.3 10*3/uL (ref 1.0–3.6)
MCH: 22.9 pg — ABNORMAL LOW (ref 26.0–34.0)
MCHC: 32.2 g/dL (ref 32.0–36.0)
MCV: 71.2 fL — ABNORMAL LOW (ref 80.0–100.0)
MONO ABS: 1.6 10*3/uL — AB (ref 0.2–0.9)
MONOS PCT: 7 %
NEUTROS ABS: 18.8 10*3/uL — AB (ref 1.4–6.5)
Neutrophils Relative %: 83 %
PLATELETS: 241 10*3/uL (ref 150–440)
RBC: 5.25 MIL/uL — ABNORMAL HIGH (ref 3.80–5.20)
RDW: 14.6 % — AB (ref 11.5–14.5)
WBC: 22.7 10*3/uL — ABNORMAL HIGH (ref 3.6–11.0)

## 2017-04-28 LAB — COMPREHENSIVE METABOLIC PANEL
ALBUMIN: 3.6 g/dL (ref 3.5–5.0)
ALT: 15 U/L (ref 14–54)
ANION GAP: 8 (ref 5–15)
AST: 20 U/L (ref 15–41)
Alkaline Phosphatase: 105 U/L (ref 38–126)
BILIRUBIN TOTAL: 0.4 mg/dL (ref 0.3–1.2)
BUN: 14 mg/dL (ref 6–20)
CHLORIDE: 107 mmol/L (ref 101–111)
CO2: 25 mmol/L (ref 22–32)
Calcium: 9.4 mg/dL (ref 8.9–10.3)
Creatinine, Ser: 0.74 mg/dL (ref 0.44–1.00)
GFR calc Af Amer: >60 mL/min (ref >60)
GFR calc non Af Amer: >60 mL/min (ref >60)
GLUCOSE: 107 mg/dL — AB (ref 65–99)
POTASSIUM: 4.1 mmol/L (ref 3.5–5.1)
SODIUM: 140 mmol/L (ref 135–145)
Total Protein: 6.8 g/dL (ref 6.5–8.1)

## 2017-04-28 MED ORDER — SODIUM CHLORIDE 0.9 % IV SOLN
Freq: Once | INTRAVENOUS | Status: AC
  Administered 2017-04-28: 10:00:00 via INTRAVENOUS
  Filled 2017-04-28: qty 1000

## 2017-04-28 MED ORDER — PEGFILGRASTIM 6 MG/0.6ML ~~LOC~~ PSKT
6.0000 mg | PREFILLED_SYRINGE | Freq: Once | SUBCUTANEOUS | Status: AC
  Administered 2017-04-28: 6 mg via SUBCUTANEOUS
  Filled 2017-04-28: qty 0.6

## 2017-04-28 MED ORDER — SODIUM CHLORIDE 0.9 % IV SOLN
Freq: Once | INTRAVENOUS | Status: AC
  Administered 2017-04-28: 11:00:00 via INTRAVENOUS
  Filled 2017-04-28: qty 5

## 2017-04-28 MED ORDER — DOXORUBICIN HCL CHEMO IV INJECTION 2 MG/ML
60.0000 mg/m2 | Freq: Once | INTRAVENOUS | Status: AC
  Administered 2017-04-28: 118 mg via INTRAVENOUS
  Filled 2017-04-28: qty 59

## 2017-04-28 MED ORDER — PALONOSETRON HCL INJECTION 0.25 MG/5ML
0.2500 mg | Freq: Once | INTRAVENOUS | Status: AC
  Administered 2017-04-28: 0.25 mg via INTRAVENOUS
  Filled 2017-04-28: qty 5

## 2017-04-28 MED ORDER — SODIUM CHLORIDE 0.9 % IV SOLN
600.0000 mg/m2 | Freq: Once | INTRAVENOUS | Status: AC
  Administered 2017-04-28: 1180 mg via INTRAVENOUS
  Filled 2017-04-28: qty 9

## 2017-04-28 MED ORDER — HEPARIN SOD (PORK) LOCK FLUSH 100 UNIT/ML IV SOLN
500.0000 [IU] | Freq: Once | INTRAVENOUS | Status: AC | PRN
  Administered 2017-04-28: 500 [IU]
  Filled 2017-04-28: qty 5

## 2017-04-28 NOTE — Progress Notes (Signed)
Pt in for follow up today. Had port of cath placed yesterday.  Denies any discomfort or difficulties.  States she is a little anxious for first [facility] chemo treatment today.  RN reviewed treatment plan for today and answered some questions pt had regarding what to expect in the infusion room today.  Instructed patient to call cancer center for any questions or difficulty.  Pt verbalized understanding.

## 2017-05-03 NOTE — Progress Notes (Signed)
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  INTERVAL HISTORY: Patient returns to clinic today for evaluation and to assess her toleration of cycle 1 of 4 of Adriamycin and Cytoxan. She tolerated her treatment well without significant side effects. She had bony pain secondary to Neulasta which is now resolved. She currently feels well and is asymptomatic. She has no neurologic complaints. She denies any pain. She has a good appetite and denies weight loss. She has no chest pain or shortness of breath. She denies any nausea, vomiting, constipation, or diarrhea. She has no urinary complaints. Patient offers no specific complaints today.  REVIEW OF SYSTEMS:   Review of Systems  Constitutional: Negative.  Negative for fever, malaise/fatigue and weight loss.  Respiratory: Negative.  Negative for cough and shortness of breath.   Cardiovascular: Negative.  Negative for chest pain and leg swelling.  Gastrointestinal: Negative.  Negative for abdominal pain.  Genitourinary: Negative.   Musculoskeletal: Negative.   Skin: Negative for rash.  Neurological: Negative.  Negative for sensory change and weakness.  Psychiatric/Behavioral: The patient is nervous/anxious.     As per HPI. Otherwise, a complete review of systems is negative.  PAST MEDICAL HISTORY: Past Medical History:  Diagnosis Date  . Arthritis   . Cancer ([place])   . GERD (gastroesophageal reflux disease)    NO MEDS  . Heart murmur   . Hypertension   . Migraines   . Thumb fracture    LEFT-WEARING BRACE    PAST SURGICAL HISTORY: Past Surgical History:  Procedure Laterality Date  . BREAST BIOPSY Right 03/19/2017   US  guided biopsy of right breast and right lymph node  . CESAREAN SECTION    . FRACTURE SURGERY     left hand  . PILONIDAL CYST EXCISION  1980'S  . PORT-A-CATH REMOVAL N/A 04/27/2017   Procedure: REMOVAL OF PORT A CATH & insertion PORT A CATH LEFT INTERNAL JUGULAR;  Surgeon: Herbert Pun, MD;  Location: [facility] ORS;  Service: General;  Laterality: N/A;  . PORTACATH PLACEMENT N/A 04/15/2017   Procedure: INSERTION PORT-A-CATH;  Surgeon: Herbert Pun, MD;  Location: [facility] ORS;  Service: General;  Laterality: N/A;    FAMILY HISTORY: Family History  Problem Relation Age of Onset  . Breast cancer Maternal Grandmother   . Hypertension Maternal Grandmother   . Angina Mother   . Lung cancer Father   . Diabetes Father   . Prostate cancer Maternal Uncle   . Dementia Maternal Grandfather   . Congestive Heart Failure Maternal Grandfather   . Heart disease Paternal Grandmother     ADVANCED DIRECTIVES (Y/N):  N  HEALTH MAINTENANCE: Social History  Substance Use Topics  . Smoking status: Current Every Day Smoker    Packs/day: 2.00    Years: 18.00    Types: Cigarettes  . Smokeless tobacco: Never Used     Comment: PT IS NOW DOWN TO 1/2 PACK SINCE CANCER DIAGNOSIS   . Alcohol use No     Colonoscopy:  PAP:  Bone density:  Lipid panel:  Allergies  Allergen Reactions  . Hydrocodone-Acetaminophen Itching and Rash  . Penicillins Swelling, Other (See Comments) and Rash    Throat swelling Has patient had a PCN reaction causing immediate  rash, facial/tongue/throat swelling, SOB or lightheadedness with hypotension: Yes Has patient had a PCN reaction causing severe rash involving mucus membranes or skin necrosis: No Has patient had a PCN reaction that required hospitalization: No Has patient had a PCN reaction occurring within the last 10 years: No If all of the above answers are "NO", then may proceed with Cephalosporin use.     Current Outpatient Prescriptions  Medication Sig  Dispense Refill  . acetaminophen (TYLENOL) 500 MG tablet Take 1,000 mg by mouth every 8 (eight) hours as needed for mild pain.    . bisoprolol-hydrochlorothiazide (ZIAC) 5-6.25 MG tablet TAKE 1 TABLET BY MOUTH ONCE DAILY-AM    . buPROPion (WELLBUTRIN SR) 200 MG 12 hr tablet Take 200 mg by mouth 2 (two) times daily.    . diclofenac sodium (VOLTAREN) 1 % GEL APPLY 2 GRAMS TO AFFECTED AREA UP TO 4 TIMES A DAY AS NEEDED  0  . lidocaine-prilocaine (EMLA) cream Apply to affected area once (Patient taking differently: Apply 1 application topically See admin instructions. Apply to port 2 hours prior to chemotherapy) 30 g 3  . loratadine (CLARITIN) 10 MG tablet Take 10 mg by mouth daily as needed for allergies.    Marland Kitchen ondansetron (ZOFRAN) 8 MG tablet Take 1 tablet (8 mg total) by mouth 2 (two) times daily as needed. (Patient taking differently: Take 8 mg by mouth 2 (two) times daily as needed for nausea or vomiting. ) 60 tablet 2  . prochlorperazine (COMPAZINE) 10 MG tablet Take 1 tablet (10 mg total) by mouth every 6 (six) hours as needed (Nausea or vomiting). 60 tablet 2  . traMADol (ULTRAM) 50 MG tablet Take 50 mg by mouth daily.      No current facility-administered medications for this visit.     OBJECTIVE: Vitals:   05/05/17 0910  BP: 133/84  Pulse: 78  Resp: 18  Temp: (!) 97 F (36.1 C)     Body mass index is 31.93 kg/m.    ECOG FS:0 - Asymptomatic  General: Well-developed, well-nourished, no acute distress. Eyes: Pink conjunctiva, anicteric sclera. Breasts: Patient requested exam be deferred today. Lungs: Clear to auscultation bilaterally. Heart: Regular rate and rhythm. No rubs, murmurs, or gallops. Abdomen: Soft, nontender, nondistended. No organomegaly noted, normoactive bowel sounds. Musculoskeletal: No edema, cyanosis, or clubbing. Neuro: Alert, answering all questions appropriately. Cranial nerves grossly intact. Skin: No rashes or petechiae noted. Psych: Normal affect.   LAB  RESULTS:  Lab Results  Component Value Date   NA 137 05/05/2017   K 4.1 05/05/2017   CL 106 05/05/2017   CO2 24 05/05/2017   GLUCOSE 94 05/05/2017   BUN 12 05/05/2017   CREATININE 0.70 05/05/2017   CALCIUM 9.0 05/05/2017   PROT 6.9 05/05/2017   ALBUMIN 3.8 05/05/2017   AST 16 05/05/2017   ALT 15 05/05/2017   ALKPHOS 126 05/05/2017   BILITOT 0.6 05/05/2017   GFRNONAA >60 05/05/2017   GFRAA >60 05/05/2017    Lab Results  Component Value Date   WBC 1.8 (L) 05/05/2017   NEUTROABS 0.2 (L) 05/05/2017   HGB 12.4 05/05/2017   HCT 37.7 05/05/2017   MCV 70.5 (L) 05/05/2017   PLT 136 (L) 05/05/2017     STUDIES: Ct Chest W Contrast  Result Date: 04/13/2017 CLINICAL DATA:  New diagnosis of right breast cancer. EXAM: CT CHEST, ABDOMEN, AND PELVIS WITH CONTRAST TECHNIQUE: Multidetector CT imaging of the chest, abdomen and pelvis was performed following the standard protocol during bolus administration  of intravenous contrast. CONTRAST:  139m ISOVUE-300 IOPAMIDOL (ISOVUE-300) INJECTION 61% COMPARISON:  None. FINDINGS: CT CHEST FINDINGS Cardiovascular: The heart is normal in size. No pericardial effusion. The aorta and branch vessels are normal. Mediastinum/Nodes: No mediastinal or hilar mass or lymphadenopathy. Esophagus is grossly normal. Lungs/Pleura: No worrisome pulmonary lesions or pulmonary nodules to suggest metastatic disease. Mild emphysematous changes with largely paraseptal emphysema. No infiltrates, edema or effusions. No interstitial lung disease or bronchiectasis. Musculoskeletal: 2.5 cm right breast mass containing a biopsy clip. No supraclavicular or axillary lymphadenopathy. Small scattered lymph nodes are noted bilaterally. The thyroid gland appears normal. No significant bony findings no evidence of osseous metastatic disease. CT ABDOMEN PELVIS FINDINGS Hepatobiliary: No focal hepatic lesions to suggest metastatic disease. The gallbladder is normal. No common bile duct  dilatation. Pancreas: No mass, inflammation or ductal dilatation. Spleen: Normal size.  No focal lesions. Adrenals/Urinary Tract: The adrenal glands and kidneys are unremarkable. The bladder is normal. Stomach/Bowel: The stomach, duodenum, small bowel and colon are normal. No inflammatory changes, mass lesions or obstructive findings. The terminal ileum is normal. The appendix is normal. Vascular/Lymphatic: Moderate, age advanced, distal aortic and iliac artery atherosclerotic calcifications. No aneurysm or dissection. The branch vessels are patent. The major venous structures are patent. No mesenteric or retroperitoneal mass or lymphadenopathy. Reproductive: Normal uterus and ovaries. Other: No pelvic mass or adenopathy. No free pelvic fluid collections. No inguinal mass or adenopathy. No abdominal wall hernia or subcutaneous lesions. Musculoskeletal: No lytic or sclerotic bone lesions to suggest metastasis. Advanced facet disease in the lower lumbar spine is noted along with advanced degenerate disc disease at L5-S1. IMPRESSION: 1. Right breast mass but no enlarged supraclavicular or axillary lymph nodes. 2. No CT findings for metastatic disease involving the chest, abdomen or pelvis. 3. Emphysematous changes but no acute pulmonary findings. 4. No findings to suggest osseous metastatic disease. 5. Age advanced atherosclerotic calcifications involving the distal aorta and iliac arteries. Electronically Signed   By: PMarijo SanesM.D.   On: 04/13/2017 14:52   Nm Cardiac Muga Rest  Result Date: 04/13/2017 CLINICAL DATA:  RIGHT breast cancer, pre cardiotoxic chemotherapy EXAM: NUCLEAR MEDICINE CARDIAC BLOOD POOL IMAGING (MUGA) TECHNIQUE: Cardiac multi-gated acquisition was performed at rest following intravenous injection of Tc-933mabeled red blood cells. RADIOPHARMACEUTICALS:  20.92 mCi Tc-9934mrtechnetate in-vitro labeled autologous red blood cells IV COMPARISON:  None FINDINGS: LEFT ventricular ejection  fraction is calculated at 53%. Study was obtained at a heart rate of 70 beats per minute. Patient was rhythmic during imaging. No focal wall motion abnormalities identified. IMPRESSION: LEFT ventricular ejection fraction of 53%. No focal wall motion abnormalities. Electronically Signed   By: MarLavonia DanaD.   On: 04/13/2017 12:31   Ct Abdomen Pelvis W Contrast  Result Date: 04/13/2017 CLINICAL DATA:  New diagnosis of right breast cancer. EXAM: CT CHEST, ABDOMEN, AND PELVIS WITH CONTRAST TECHNIQUE: Multidetector CT imaging of the chest, abdomen and pelvis was performed following the standard protocol during bolus administration of intravenous contrast. CONTRAST:  100m98mOVUE-300 IOPAMIDOL (ISOVUE-300) INJECTION 61% COMPARISON:  None. FINDINGS: CT CHEST FINDINGS Cardiovascular: The heart is normal in size. No pericardial effusion. The aorta and branch vessels are normal. Mediastinum/Nodes: No mediastinal or hilar mass or lymphadenopathy. Esophagus is grossly normal. Lungs/Pleura: No worrisome pulmonary lesions or pulmonary nodules to suggest metastatic disease. Mild emphysematous changes with largely paraseptal emphysema. No infiltrates, edema or effusions. No interstitial lung disease or bronchiectasis. Musculoskeletal: 2.5 cm right breast mass containing a biopsy  clip. No supraclavicular or axillary lymphadenopathy. Small scattered lymph nodes are noted bilaterally. The thyroid gland appears normal. No significant bony findings no evidence of osseous metastatic disease. CT ABDOMEN PELVIS FINDINGS Hepatobiliary: No focal hepatic lesions to suggest metastatic disease. The gallbladder is normal. No common bile duct dilatation. Pancreas: No mass, inflammation or ductal dilatation. Spleen: Normal size.  No focal lesions. Adrenals/Urinary Tract: The adrenal glands and kidneys are unremarkable. The bladder is normal. Stomach/Bowel: The stomach, duodenum, small bowel and colon are normal. No inflammatory changes, mass  lesions or obstructive findings. The terminal ileum is normal. The appendix is normal. Vascular/Lymphatic: Moderate, age advanced, distal aortic and iliac artery atherosclerotic calcifications. No aneurysm or dissection. The branch vessels are patent. The major venous structures are patent. No mesenteric or retroperitoneal mass or lymphadenopathy. Reproductive: Normal uterus and ovaries. Other: No pelvic mass or adenopathy. No free pelvic fluid collections. No inguinal mass or adenopathy. No abdominal wall hernia or subcutaneous lesions. Musculoskeletal: No lytic or sclerotic bone lesions to suggest metastasis. Advanced facet disease in the lower lumbar spine is noted along with advanced degenerate disc disease at L5-S1. IMPRESSION: 1. Right breast mass but no enlarged supraclavicular or axillary lymph nodes. 2. No CT findings for metastatic disease involving the chest, abdomen or pelvis. 3. Emphysematous changes but no acute pulmonary findings. 4. No findings to suggest osseous metastatic disease. 5. Age advanced atherosclerotic calcifications involving the distal aorta and iliac arteries. Electronically Signed   By: Marijo Sanes M.D.   On: 04/13/2017 14:52   Dg Chest Port 1 View  Result Date: 04/27/2017 CLINICAL DATA:  Port-A-Cath placement EXAM: PORTABLE CHEST 1 VIEW COMPARISON:  04/15/2017 FINDINGS: Left Port-A-Cath in place with the tip in the lower SVC near the cavoatrial junction. No pneumothorax. Heart and mediastinal contours are within normal limits. No focal opacities or effusions. No acute bony abnormality. IMPRESSION: Left Port-A-Cath tip in the lower SVC. No pneumothorax. No active disease. Electronically Signed   By: Rolm Baptise M.D.   On: 04/27/2017 11:07   Dg Chest Port 1 View  Result Date: 04/15/2017 CLINICAL DATA:  Status post port insertion EXAM: PORTABLE CHEST 1 VIEW COMPARISON:  None. FINDINGS: Cardiac shadow is within normal limits. New left chest wall port is noted with the catheter  tip at the proximal superior vena cava. No pneumothorax is noted. No infiltrate is seen. No bony abnormality is noted. IMPRESSION: No pneumothorax following port placement Electronically Signed   By: Inez Catalina M.D.   On: 04/15/2017 09:51   Dg Fluoro Guide Cv Line Left  Result Date: 04/22/2017 CLINICAL DATA:  Recently placed left chest Port-A-Cath. The Port-A-Cath will not aspirate. EXAM: PORT-A-CATH INJECTION WITH FLUOROSCOPY TECHNIQUE: The left chest Port-A-Cath was accessed using sterile technique. FLUOROSCOPY TIME:  Fluoroscopy Time:  30 seconds COMPARISON:  Intraoperative images 04/15/2017 FINDINGS: There is a left jugular Port-A-Cath with the tip at the central aspect of the left innominate vein. Tip is near the junction of the left innominate vein and SVC. The Port-A-Cath flushes without difficulty but cannot aspirate blood from the catheter. Port injection demonstrates a fibrin sheath at the tip of the catheter. The port is intact without discontinuity or fracture. IMPRESSION: Fibrin sheath at the tip of the catheter. The catheter tip is located in the central aspect of the left innominate vein near the junction of the left innominate vein and SVC. Recommend revision of the Port-A-Cath with placement of the catheter tip at the superior cavoatrial junction. Electronically Signed  By: Markus Daft M.D.   On: 04/22/2017 14:18   Dg C-arm 1-60 Min-no Report  Result Date: 04/27/2017 Fluoroscopy was utilized by the requesting physician.  No radiographic interpretation.   Dg C-arm 1-60 Min-no Report  Result Date: 04/15/2017 Fluoroscopy was utilized by the requesting physician.  No radiographic interpretation.    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  PLAN:    1. 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 After lengthy discussion with the patient, she is highly interested in breast conservation and wants to minimize the amount of surgery. Because of this, will proceed with neoadjuvant chemotherapy using Adriamycin, Cytoxan, and Taxol with Neulasta support. Once patient completes her chemotherapy, she will require lumpectomy followed by XRT as well as an aromatase inhibitor for 5 years. CT scan from April 13, 2017 did not reveal any metastatic disease. Pretreatment MUGA is adequate to proceed with an EF of 53%. She tolerated cycle 1 of 4 of dose dense Adriamycin and Cytoxan with Neulasta support last week without significant side effects. Return to clinic in 1 week for consideration of cycle 2. 2. Right hand fracture: Continue monitoring treatment per orthopedics. 3. Neutropenia: Secondary to chemotherapy. Neulasta as above. 4. Bone pain:Secondary to Neulasta. Continue Claritin and Tylenol as directed.  Approximately 30 minutes was spent in discussion of which greater than 50% was consultation.  Patient expressed understanding and was in agreement with this plan. She also understands that She can call clinic at any time with any questions, concerns, or complaints.   Cancer Staging Primary cancer of upper outer quadrant of right female breast [facility]) Staging form: Breast, AJCC 8th Edition - Clinical stage from 03/27/2017: Stage IB (cT1c, cN1, cM0, G2, ER: Positive, PR: Positive, HER2: Negative) - Signed by Lloyd Huger, MD on 03/27/2017   Lloyd Huger, MD   05/05/2017 10:45 AM

## 2017-05-05 ENCOUNTER — Inpatient Hospital Stay: Payer: BLUE CROSS/BLUE SHIELD

## 2017-05-05 ENCOUNTER — Inpatient Hospital Stay (HOSPITAL_BASED_OUTPATIENT_CLINIC_OR_DEPARTMENT_OTHER): Payer: BLUE CROSS/BLUE SHIELD | Admitting: Oncology

## 2017-05-05 ENCOUNTER — Ambulatory Visit: Payer: BLUE CROSS/BLUE SHIELD

## 2017-05-05 VITALS — BP 133/84 | HR 78 | Temp 97.0°F | Resp 18 | Wt 186.0 lb

## 2017-05-05 DIAGNOSIS — D72829 Elevated white blood cell count, unspecified: Secondary | ICD-10-CM

## 2017-05-05 DIAGNOSIS — D701 Agranulocytosis secondary to cancer chemotherapy: Secondary | ICD-10-CM | POA: Diagnosis not present

## 2017-05-05 DIAGNOSIS — Z170 Estrogen receptor positive status [ER+]: Secondary | ICD-10-CM

## 2017-05-05 DIAGNOSIS — F17210 Nicotine dependence, cigarettes, uncomplicated: Secondary | ICD-10-CM

## 2017-05-05 DIAGNOSIS — T451X5S Adverse effect of antineoplastic and immunosuppressive drugs, sequela: Secondary | ICD-10-CM | POA: Diagnosis not present

## 2017-05-05 DIAGNOSIS — Z79899 Other long term (current) drug therapy: Secondary | ICD-10-CM

## 2017-05-05 DIAGNOSIS — C50411 Malignant neoplasm of upper-outer quadrant of right female breast: Secondary | ICD-10-CM

## 2017-05-05 LAB — CBC WITH DIFFERENTIAL/PLATELET
BASOS ABS: 0 10*3/uL (ref 0–0.1)
Basophils Relative: 1 %
EOS ABS: 0.1 10*3/uL (ref 0–0.7)
EOS PCT: 5 %
HCT: 37.7 % (ref 35.0–47.0)
Hemoglobin: 12.4 g/dL (ref 12.0–16.0)
LYMPHS PCT: 80 %
Lymphs Abs: 1.4 10*3/uL (ref 1.0–3.6)
MCH: 23.2 pg — AB (ref 26.0–34.0)
MCHC: 33 g/dL (ref 32.0–36.0)
MCV: 70.5 fL — AB (ref 80.0–100.0)
MONO ABS: 0 10*3/uL — AB (ref 0.2–0.9)
Monocytes Relative: 2 %
Neutro Abs: 0.2 10*3/uL — ABNORMAL LOW (ref 1.4–6.5)
Neutrophils Relative %: 12 %
PLATELETS: 136 10*3/uL — AB (ref 150–440)
RBC: 5.35 MIL/uL — AB (ref 3.80–5.20)
RDW: 14.3 % (ref 11.5–14.5)
WBC: 1.8 10*3/uL — AB (ref 3.6–11.0)

## 2017-05-05 LAB — COMPREHENSIVE METABOLIC PANEL
ALT: 15 U/L (ref 14–54)
AST: 16 U/L (ref 15–41)
Albumin: 3.8 g/dL (ref 3.5–5.0)
Alkaline Phosphatase: 126 U/L (ref 38–126)
Anion gap: 7 (ref 5–15)
BUN: 12 mg/dL (ref 6–20)
CHLORIDE: 106 mmol/L (ref 101–111)
CO2: 24 mmol/L (ref 22–32)
Calcium: 9 mg/dL (ref 8.9–10.3)
Creatinine, Ser: 0.7 mg/dL (ref 0.44–1.00)
Glucose, Bld: 94 mg/dL (ref 65–99)
POTASSIUM: 4.1 mmol/L (ref 3.5–5.1)
SODIUM: 137 mmol/L (ref 135–145)
Total Bilirubin: 0.6 mg/dL (ref 0.3–1.2)
Total Protein: 6.9 g/dL (ref 6.5–8.1)

## 2017-05-05 NOTE — Progress Notes (Signed)
Pt in today for 1 week follow up after [facility] treatment.   Pt has lost 5 pounds, but reports is eating and drinking.  Experience some nausea for 3 days after treatment, reports zofran helped when taken.  Denies nausea today.

## 2017-05-05 NOTE — Progress Notes (Signed)
  Oncology Nurse Navigator Documentation Destiny Rogers stopped by the front desk this am and expressed wanting to learn more about any financial assistance that she can acquire. Referral sent to PSN. Navigator Location: [facility] (05/05/17 0800)   )Navigator Encounter Type: Lobby (05/05/17 0800)                                                    Time Spent with Patient: 15 (05/05/17 0800)

## 2017-05-09 NOTE — Progress Notes (Signed)
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  INTERVAL HISTORY: Patient returns to clinic today for evaluation and consideration of cycle 2 of 4 of Adriamycin and Cytoxan. She currently feels well and is asymptomatic. She has no neurologic complaints. She denies any pain. She has a good appetite and denies weight loss. She has no chest pain or shortness of breath. She denies any nausea, vomiting, constipation, or diarrhea. She has no urinary complaints. Patient offers no specific complaints today.  REVIEW OF SYSTEMS:   Review of Systems  Constitutional: Negative.  Negative for fever, malaise/fatigue and weight loss.  Respiratory: Negative.  Negative for cough and shortness of breath.   Cardiovascular: Negative.  Negative for chest pain and leg swelling.  Gastrointestinal: Negative.  Negative for abdominal pain.  Genitourinary: Negative.   Musculoskeletal: Negative.   Skin: Negative for rash.  Neurological: Negative.  Negative for sensory change and weakness.  Psychiatric/Behavioral: The patient is nervous/anxious.     As per HPI. Otherwise, a complete review of systems is negative.  PAST MEDICAL HISTORY: Past Medical History:  Diagnosis Date  . Arthritis   . Cancer (HCC)   . GERD (gastroesophageal reflux disease)    NO MEDS  . Heart murmur   . Hypertension   . Migraines   . Thumb fracture    LEFT-WEARING BRACE    PAST SURGICAL HISTORY: Past Surgical History:  Procedure Laterality Date  . BREAST BIOPSY Right 03/19/2017   US guided biopsy of right breast and right lymph node  . CESAREAN SECTION    . FRACTURE SURGERY     left hand  . PILONIDAL CYST EXCISION   1980'S  . PORT-A-CATH REMOVAL N/A 04/27/2017   Procedure: REMOVAL OF PORT A CATH & insertion PORT A CATH LEFT INTERNAL JUGULAR;  Surgeon: Carolan Shiver, MD;  Location: [facility] ORS;  Service: General;  Laterality: N/A;  . PORTACATH PLACEMENT N/A 04/15/2017   Procedure: INSERTION PORT-A-CATH;  Surgeon: Carolan Shiver, MD;  Location: [facility] ORS;  Service: General;  Laterality: N/A;    FAMILY HISTORY: Family History  Problem Relation Age of Onset  . Breast cancer Maternal Grandmother   . Hypertension Maternal Grandmother   . Angina Mother   . Lung cancer Father   . Diabetes Father   . Prostate cancer Maternal Uncle   . Dementia Maternal Grandfather   . Congestive Heart Failure Maternal Grandfather   . Heart disease Paternal Grandmother     ADVANCED DIRECTIVES (Y/N):  N  HEALTH MAINTENANCE: Social History  Substance Use Topics  . Smoking status: Current Every Day Smoker    Packs/day: 2.00    Years: 18.00    Types: Cigarettes  . Smokeless tobacco: Never Used     Comment: PT IS NOW DOWN TO 1/2 PACK SINCE CANCER DIAGNOSIS   . Alcohol use No     Colonoscopy:  PAP:  Bone density:  Lipid panel:  Allergies  Allergen Reactions  . Hydrocodone-Acetaminophen Itching and Rash  . Penicillins Swelling, Other (See Comments) and Rash    Throat swelling Has patient had a PCN reaction causing immediate rash, facial/tongue/throat swelling, SOB or lightheadedness with hypotension: Yes Has patient had a PCN reaction causing severe rash involving mucus membranes or skin  necrosis: No Has patient had a PCN reaction that required hospitalization: No Has patient had a PCN reaction occurring within the last 10 years: No If all of the above answers are "NO", then may proceed with Cephalosporin use.     Current Outpatient Prescriptions  Medication Sig Dispense Refill  . acetaminophen (TYLENOL) 500 MG tablet Take 1,000 mg by mouth every 8 (eight) hours as needed for mild pain.    .  bisoprolol-hydrochlorothiazide (ZIAC) 5-6.25 MG tablet TAKE 1 TABLET BY MOUTH ONCE DAILY-AM    . buPROPion (WELLBUTRIN SR) 200 MG 12 hr tablet Take 200 mg by mouth 2 (two) times daily.    . diclofenac sodium (VOLTAREN) 1 % GEL APPLY 2 GRAMS TO AFFECTED AREA UP TO 4 TIMES A DAY AS NEEDED  0  . lidocaine-prilocaine (EMLA) cream Apply to affected area once (Patient taking differently: Apply 1 application topically See admin instructions. Apply to port 2 hours prior to chemotherapy) 30 g 3  . loratadine (CLARITIN) 10 MG tablet Take 10 mg by mouth daily as needed for allergies.    Marland Kitchen ondansetron (ZOFRAN) 8 MG tablet Take 1 tablet (8 mg total) by mouth 2 (two) times daily as needed. (Patient taking differently: Take 8 mg by mouth 2 (two) times daily as needed for nausea or vomiting. ) 60 tablet 2  . prochlorperazine (COMPAZINE) 10 MG tablet Take 1 tablet (10 mg total) by mouth every 6 (six) hours as needed (Nausea or vomiting). 60 tablet 2  . traMADol (ULTRAM) 50 MG tablet Take 50 mg by mouth daily.      No current facility-administered medications for this visit.    Facility-Administered Medications Ordered in Other Visits  Medication Dose Route Frequency Provider Last Rate Last Dose  . [COMPLETED] heparin lock flush 100 unit/mL  500 Units Intravenous Once Lloyd Huger, MD   500 Units at 05/12/17 1405  . heparin lock flush 100 unit/mL  500 Units Intracatheter Once PRN Lloyd Huger, MD        OBJECTIVE: Vitals:   05/12/17 1113  BP: 137/82  Pulse: 82  Resp: 18  Temp: 98.1 F (36.7 C)     Body mass index is 32.34 kg/m.    ECOG FS:0 - Asymptomatic  General: Well-developed, well-nourished, no acute distress. Eyes: Pink conjunctiva, anicteric sclera. Breasts: Patient requested exam be deferred today. Lungs: Clear to auscultation bilaterally. Heart: Regular rate and rhythm. No rubs, murmurs, or gallops. Abdomen: Soft, nontender, nondistended. No organomegaly noted, normoactive bowel  sounds. Musculoskeletal: No edema, cyanosis, or clubbing. Neuro: Alert, answering all questions appropriately. Cranial nerves grossly intact. Skin: No rashes or petechiae noted. Psych: Normal affect.   LAB RESULTS:  Lab Results  Component Value Date   NA 140 05/12/2017   K 3.4 (L) 05/12/2017   CL 105 05/12/2017   CO2 24 05/12/2017   GLUCOSE 153 (H) 05/12/2017   BUN 9 05/12/2017   CREATININE 0.75 05/12/2017   CALCIUM 8.8 (L) 05/12/2017   PROT 6.5 05/12/2017   ALBUMIN 3.6 05/12/2017   AST 26 05/12/2017   ALT 16 05/12/2017   ALKPHOS 117 05/12/2017   BILITOT 0.2 (L) 05/12/2017   GFRNONAA >60 05/12/2017   GFRAA >60 05/12/2017    Lab Results  Component Value Date   WBC 13.9 (H) 05/12/2017   NEUTROABS 10.5 (H) 05/12/2017   HGB 11.1 (L) 05/12/2017   HCT 34.3 (L) 05/12/2017   MCV 71.0 (L) 05/12/2017   PLT 230 05/12/2017     STUDIES:  Ct Chest W Contrast  Result Date: 04/13/2017 CLINICAL DATA:  New diagnosis of right breast cancer. EXAM: CT CHEST, ABDOMEN, AND PELVIS WITH CONTRAST TECHNIQUE: Multidetector CT imaging of the chest, abdomen and pelvis was performed following the standard protocol during bolus administration of intravenous contrast. CONTRAST:  172m ISOVUE-300 IOPAMIDOL (ISOVUE-300) INJECTION 61% COMPARISON:  None. FINDINGS: CT CHEST FINDINGS Cardiovascular: The heart is normal in size. No pericardial effusion. The aorta and branch vessels are normal. Mediastinum/Nodes: No mediastinal or hilar mass or lymphadenopathy. Esophagus is grossly normal. Lungs/Pleura: No worrisome pulmonary lesions or pulmonary nodules to suggest metastatic disease. Mild emphysematous changes with largely paraseptal emphysema. No infiltrates, edema or effusions. No interstitial lung disease or bronchiectasis. Musculoskeletal: 2.5 cm right breast mass containing a biopsy clip. No supraclavicular or axillary lymphadenopathy. Small scattered lymph nodes are noted bilaterally. The thyroid gland  appears normal. No significant bony findings no evidence of osseous metastatic disease. CT ABDOMEN PELVIS FINDINGS Hepatobiliary: No focal hepatic lesions to suggest metastatic disease. The gallbladder is normal. No common bile duct dilatation. Pancreas: No mass, inflammation or ductal dilatation. Spleen: Normal size.  No focal lesions. Adrenals/Urinary Tract: The adrenal glands and kidneys are unremarkable. The bladder is normal. Stomach/Bowel: The stomach, duodenum, small bowel and colon are normal. No inflammatory changes, mass lesions or obstructive findings. The terminal ileum is normal. The appendix is normal. Vascular/Lymphatic: Moderate, age advanced, distal aortic and iliac artery atherosclerotic calcifications. No aneurysm or dissection. The branch vessels are patent. The major venous structures are patent. No mesenteric or retroperitoneal mass or lymphadenopathy. Reproductive: Normal uterus and ovaries. Other: No pelvic mass or adenopathy. No free pelvic fluid collections. No inguinal mass or adenopathy. No abdominal wall hernia or subcutaneous lesions. Musculoskeletal: No lytic or sclerotic bone lesions to suggest metastasis. Advanced facet disease in the lower lumbar spine is noted along with advanced degenerate disc disease at L5-S1. IMPRESSION: 1. Right breast mass but no enlarged supraclavicular or axillary lymph nodes. 2. No CT findings for metastatic disease involving the chest, abdomen or pelvis. 3. Emphysematous changes but no acute pulmonary findings. 4. No findings to suggest osseous metastatic disease. 5. Age advanced atherosclerotic calcifications involving the distal aorta and iliac arteries. Electronically Signed   By: PMarijo SanesM.D.   On: 04/13/2017 14:52   Nm Cardiac Muga Rest  Result Date: 04/13/2017 CLINICAL DATA:  RIGHT breast cancer, pre cardiotoxic chemotherapy EXAM: NUCLEAR MEDICINE CARDIAC BLOOD POOL IMAGING (MUGA) TECHNIQUE: Cardiac multi-gated acquisition was performed at  rest following intravenous injection of Tc-926mabeled red blood cells. RADIOPHARMACEUTICALS:  20.92 mCi Tc-9924mrtechnetate in-vitro labeled autologous red blood cells IV COMPARISON:  None FINDINGS: LEFT ventricular ejection fraction is calculated at 53%. Study was obtained at a heart rate of 70 beats per minute. Patient was rhythmic during imaging. No focal wall motion abnormalities identified. IMPRESSION: LEFT ventricular ejection fraction of 53%. No focal wall motion abnormalities. Electronically Signed   By: MarLavonia DanaD.   On: 04/13/2017 12:31   Ct Abdomen Pelvis W Contrast  Result Date: 04/13/2017 CLINICAL DATA:  New diagnosis of right breast cancer. EXAM: CT CHEST, ABDOMEN, AND PELVIS WITH CONTRAST TECHNIQUE: Multidetector CT imaging of the chest, abdomen and pelvis was performed following the standard protocol during bolus administration of intravenous contrast. CONTRAST:  100m81mOVUE-300 IOPAMIDOL (ISOVUE-300) INJECTION 61% COMPARISON:  None. FINDINGS: CT CHEST FINDINGS Cardiovascular: The heart is normal in size. No pericardial effusion. The aorta and branch vessels are normal. Mediastinum/Nodes: No mediastinal or hilar mass or  lymphadenopathy. Esophagus is grossly normal. Lungs/Pleura: No worrisome pulmonary lesions or pulmonary nodules to suggest metastatic disease. Mild emphysematous changes with largely paraseptal emphysema. No infiltrates, edema or effusions. No interstitial lung disease or bronchiectasis. Musculoskeletal: 2.5 cm right breast mass containing a biopsy clip. No supraclavicular or axillary lymphadenopathy. Small scattered lymph nodes are noted bilaterally. The thyroid gland appears normal. No significant bony findings no evidence of osseous metastatic disease. CT ABDOMEN PELVIS FINDINGS Hepatobiliary: No focal hepatic lesions to suggest metastatic disease. The gallbladder is normal. No common bile duct dilatation. Pancreas: No mass, inflammation or ductal dilatation. Spleen:  Normal size.  No focal lesions. Adrenals/Urinary Tract: The adrenal glands and kidneys are unremarkable. The bladder is normal. Stomach/Bowel: The stomach, duodenum, small bowel and colon are normal. No inflammatory changes, mass lesions or obstructive findings. The terminal ileum is normal. The appendix is normal. Vascular/Lymphatic: Moderate, age advanced, distal aortic and iliac artery atherosclerotic calcifications. No aneurysm or dissection. The branch vessels are patent. The major venous structures are patent. No mesenteric or retroperitoneal mass or lymphadenopathy. Reproductive: Normal uterus and ovaries. Other: No pelvic mass or adenopathy. No free pelvic fluid collections. No inguinal mass or adenopathy. No abdominal wall hernia or subcutaneous lesions. Musculoskeletal: No lytic or sclerotic bone lesions to suggest metastasis. Advanced facet disease in the lower lumbar spine is noted along with advanced degenerate disc disease at L5-S1. IMPRESSION: 1. Right breast mass but no enlarged supraclavicular or axillary lymph nodes. 2. No CT findings for metastatic disease involving the chest, abdomen or pelvis. 3. Emphysematous changes but no acute pulmonary findings. 4. No findings to suggest osseous metastatic disease. 5. Age advanced atherosclerotic calcifications involving the distal aorta and iliac arteries. Electronically Signed   By: Marijo Sanes M.D.   On: 04/13/2017 14:52   Dg Chest Port 1 View  Result Date: 04/27/2017 CLINICAL DATA:  Port-A-Cath placement EXAM: PORTABLE CHEST 1 VIEW COMPARISON:  04/15/2017 FINDINGS: Left Port-A-Cath in place with the tip in the lower SVC near the cavoatrial junction. No pneumothorax. Heart and mediastinal contours are within normal limits. No focal opacities or effusions. No acute bony abnormality. IMPRESSION: Left Port-A-Cath tip in the lower SVC. No pneumothorax. No active disease. Electronically Signed   By: Rolm Baptise M.D.   On: 04/27/2017 11:07   Dg Chest  Port 1 View  Result Date: 04/15/2017 CLINICAL DATA:  Status post port insertion EXAM: PORTABLE CHEST 1 VIEW COMPARISON:  None. FINDINGS: Cardiac shadow is within normal limits. New left chest wall port is noted with the catheter tip at the proximal superior vena cava. No pneumothorax is noted. No infiltrate is seen. No bony abnormality is noted. IMPRESSION: No pneumothorax following port placement Electronically Signed   By: Inez Catalina M.D.   On: 04/15/2017 09:51   Dg Fluoro Guide Cv Line Left  Result Date: 04/22/2017 CLINICAL DATA:  Recently placed left chest Port-A-Cath. The Port-A-Cath will not aspirate. EXAM: PORT-A-CATH INJECTION WITH FLUOROSCOPY TECHNIQUE: The left chest Port-A-Cath was accessed using sterile technique. FLUOROSCOPY TIME:  Fluoroscopy Time:  30 seconds COMPARISON:  Intraoperative images 04/15/2017 FINDINGS: There is a left jugular Port-A-Cath with the tip at the central aspect of the left innominate vein. Tip is near the junction of the left innominate vein and SVC. The Port-A-Cath flushes without difficulty but cannot aspirate blood from the catheter. Port injection demonstrates a fibrin sheath at the tip of the catheter. The port is intact without discontinuity or fracture. IMPRESSION: Fibrin sheath at the tip of the  catheter. The catheter tip is located in the central aspect of the left innominate vein near the junction of the left innominate vein and SVC. Recommend revision of the Port-A-Cath with placement of the catheter tip at the superior cavoatrial junction. Electronically Signed   By: Markus Daft M.D.   On: 04/22/2017 14:18   Dg C-arm 1-60 Min-no Report  Result Date: 04/27/2017 Fluoroscopy was utilized by the requesting physician.  No radiographic interpretation.   Dg C-arm 1-60 Min-no Report  Result Date: 04/15/2017 Fluoroscopy was utilized by the requesting physician.  No radiographic interpretation.    ASSESSMENT: Clinical stage IB ER/PR positive, HER-2/neu  negative invasive carcinoma of the upper outer quadrant of the right breast.  PLAN:    1. 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 After lengthy discussion with the patient, she is highly interested in breast conservation and wants to minimize the amount of surgery. Because of this, will proceed with neoadjuvant chemotherapy using Adriamycin, Cytoxan, and Taxol with Neulasta support. Once patient completes her chemotherapy, she will require lumpectomy followed by XRT as well as an aromatase inhibitor for 5 years. CT scan from April 13, 2017 did not reveal any metastatic disease. Pretreatment MUGA is adequate to proceed with an EF of 53%. Proceed with cycle 2 of 4 of dose dense Adriamycin and Cytoxan with Neulasta support today. Return to clinic in 2 weeks for consideration of cycle 3. 2. Right hand fracture: Continue monitoring treatment per orthopedics. 3. Neutropenia: Resolved. Secondary to chemotherapy. Neulasta as above. 4. Bone pain: Secondary to Neulasta. Continue Claritin and Tylenol as directed.  Approximately 30 minutes was spent in discussion of which greater than 50% was consultation.  Patient expressed understanding and was in agreement with this plan. She also understands that She can call clinic at any time with any questions, concerns, or complaints.   Cancer Staging Primary cancer of upper outer quadrant of right female breast [facility]) Staging form: Breast, AJCC 8th Edition - Clinical stage from 03/27/2017: Stage IB (cT1c, cN1, cM0, G2, ER: Positive, PR: Positive, HER2: Negative) - Signed by Lloyd Huger, MD on 03/27/2017   Lloyd Huger, MD   05/12/2017 1:54 PM

## 2017-05-12 ENCOUNTER — Inpatient Hospital Stay (HOSPITAL_BASED_OUTPATIENT_CLINIC_OR_DEPARTMENT_OTHER): Payer: BLUE CROSS/BLUE SHIELD | Admitting: Oncology

## 2017-05-12 ENCOUNTER — Inpatient Hospital Stay: Payer: BLUE CROSS/BLUE SHIELD

## 2017-05-12 VITALS — BP 137/82 | HR 82 | Temp 98.1°F | Resp 18 | Wt 188.4 lb

## 2017-05-12 DIAGNOSIS — Z170 Estrogen receptor positive status [ER+]: Secondary | ICD-10-CM | POA: Diagnosis not present

## 2017-05-12 DIAGNOSIS — C50411 Malignant neoplasm of upper-outer quadrant of right female breast: Secondary | ICD-10-CM | POA: Diagnosis not present

## 2017-05-12 DIAGNOSIS — M898X9 Other specified disorders of bone, unspecified site: Secondary | ICD-10-CM | POA: Diagnosis not present

## 2017-05-12 DIAGNOSIS — Z79899 Other long term (current) drug therapy: Secondary | ICD-10-CM | POA: Diagnosis not present

## 2017-05-12 DIAGNOSIS — F17210 Nicotine dependence, cigarettes, uncomplicated: Secondary | ICD-10-CM | POA: Diagnosis not present

## 2017-05-12 DIAGNOSIS — T451X5S Adverse effect of antineoplastic and immunosuppressive drugs, sequela: Secondary | ICD-10-CM | POA: Diagnosis not present

## 2017-05-12 LAB — CBC WITH DIFFERENTIAL/PLATELET
Basophils Absolute: 0.1 10*3/uL (ref 0–0.1)
Basophils Relative: 0 %
EOS ABS: 0 10*3/uL (ref 0–0.7)
Eosinophils Relative: 0 %
HCT: 34.3 % — ABNORMAL LOW (ref 35.0–47.0)
HEMOGLOBIN: 11.1 g/dL — AB (ref 12.0–16.0)
LYMPHS PCT: 19 %
Lymphs Abs: 2.7 10*3/uL (ref 1.0–3.6)
MCH: 22.9 pg — AB (ref 26.0–34.0)
MCHC: 32.3 g/dL (ref 32.0–36.0)
MCV: 71 fL — ABNORMAL LOW (ref 80.0–100.0)
MONO ABS: 0.6 10*3/uL (ref 0.2–0.9)
Monocytes Relative: 5 %
NEUTROS ABS: 10.5 10*3/uL — AB (ref 1.4–6.5)
NEUTROS PCT: 76 %
Platelets: 230 10*3/uL (ref 150–440)
RBC: 4.84 MIL/uL (ref 3.80–5.20)
RDW: 14.1 % (ref 11.5–14.5)
WBC: 13.9 10*3/uL — AB (ref 3.6–11.0)

## 2017-05-12 LAB — COMPREHENSIVE METABOLIC PANEL
ALK PHOS: 117 U/L (ref 38–126)
ALT: 16 U/L (ref 14–54)
ANION GAP: 11 (ref 5–15)
AST: 26 U/L (ref 15–41)
Albumin: 3.6 g/dL (ref 3.5–5.0)
BILIRUBIN TOTAL: 0.2 mg/dL — AB (ref 0.3–1.2)
BUN: 9 mg/dL (ref 6–20)
CALCIUM: 8.8 mg/dL — AB (ref 8.9–10.3)
CO2: 24 mmol/L (ref 22–32)
Chloride: 105 mmol/L (ref 101–111)
Creatinine, Ser: 0.75 mg/dL (ref 0.44–1.00)
GFR calc non Af Amer: >60 mL/min (ref >60)
Glucose, Bld: 153 mg/dL — ABNORMAL HIGH (ref 65–99)
Potassium: 3.4 mmol/L — ABNORMAL LOW (ref 3.5–5.1)
SODIUM: 140 mmol/L (ref 135–145)
TOTAL PROTEIN: 6.5 g/dL (ref 6.5–8.1)

## 2017-05-12 MED ORDER — PALONOSETRON HCL INJECTION 0.25 MG/5ML
0.2500 mg | Freq: Once | INTRAVENOUS | Status: AC
  Administered 2017-05-12: 0.25 mg via INTRAVENOUS
  Filled 2017-05-12: qty 5

## 2017-05-12 MED ORDER — HEPARIN SOD (PORK) LOCK FLUSH 100 UNIT/ML IV SOLN
500.0000 [IU] | Freq: Once | INTRAVENOUS | Status: AC
  Administered 2017-05-12: 500 [IU] via INTRAVENOUS
  Filled 2017-05-12: qty 5

## 2017-05-12 MED ORDER — SODIUM CHLORIDE 0.9% FLUSH
10.0000 mL | Freq: Once | INTRAVENOUS | Status: AC
  Administered 2017-05-12: 10 mL via INTRAVENOUS
  Filled 2017-05-12: qty 10

## 2017-05-12 MED ORDER — SODIUM CHLORIDE 0.9 % IV SOLN
Freq: Once | INTRAVENOUS | Status: AC
  Administered 2017-05-12: 12:00:00 via INTRAVENOUS
  Filled 2017-05-12: qty 5

## 2017-05-12 MED ORDER — PEGFILGRASTIM 6 MG/0.6ML ~~LOC~~ PSKT
6.0000 mg | PREFILLED_SYRINGE | Freq: Once | SUBCUTANEOUS | Status: AC
  Administered 2017-05-12: 6 mg via SUBCUTANEOUS
  Filled 2017-05-12: qty 0.6

## 2017-05-12 MED ORDER — HEPARIN SOD (PORK) LOCK FLUSH 100 UNIT/ML IV SOLN
500.0000 [IU] | Freq: Once | INTRAVENOUS | Status: AC | PRN
  Administered 2017-05-12: 500 [IU]

## 2017-05-12 MED ORDER — SODIUM CHLORIDE 0.9 % IV SOLN
600.0000 mg/m2 | Freq: Once | INTRAVENOUS | Status: AC
  Administered 2017-05-12: 1180 mg via INTRAVENOUS
  Filled 2017-05-12: qty 50

## 2017-05-12 MED ORDER — SODIUM CHLORIDE 0.9 % IV SOLN
Freq: Once | INTRAVENOUS | Status: AC
  Administered 2017-05-12: 12:00:00 via INTRAVENOUS
  Filled 2017-05-12: qty 1000

## 2017-05-12 MED ORDER — DOXORUBICIN HCL CHEMO IV INJECTION 2 MG/ML
60.0000 mg/m2 | Freq: Once | INTRAVENOUS | Status: AC
  Administered 2017-05-12: 118 mg via INTRAVENOUS
  Filled 2017-05-12: qty 59

## 2017-05-18 NOTE — Progress Notes (Signed)
Patient phoned with compalint of "boil" on her labia.  Describes as very sore, and swollen.  She has completed 2 cycles of AC, and is scheduled for 3rd cycle next Tuesday.  Discussed with Argentina Donovan RN.  At Dr. Gary Fleet request , patient will be evaluated by Rulon Abide NP on 05/19/17 at 11:30. In basket request sent to schedulers  Oncology Nurse Navigator Documentation  Navigator Location: [facility] (05/18/17 1500)   )Navigator Encounter Type: Telephone (05/18/17 1500) Telephone: Incoming Call;Outgoing Call;Symptom Mgt (05/18/17 1500)                                                  Time Spent with Patient: 30 (05/18/17 1500)

## 2017-05-18 NOTE — Progress Notes (Signed)
Subjective:     Patient ID: Destiny Rogers, female   DOB: 1960/12/01, 56 y.o.   MRN: [id number]  HPI   Review of Systems     Objective:   Physical Exam     Assessment:     See note    Plan:     See note

## 2017-05-19 ENCOUNTER — Inpatient Hospital Stay (HOSPITAL_BASED_OUTPATIENT_CLINIC_OR_DEPARTMENT_OTHER): Payer: BLUE CROSS/BLUE SHIELD | Admitting: Oncology

## 2017-05-19 VITALS — BP 127/86 | HR 123 | Temp 97.5°F | Resp 20 | Wt 184.6 lb

## 2017-05-19 DIAGNOSIS — C50411 Malignant neoplasm of upper-outer quadrant of right female breast: Secondary | ICD-10-CM | POA: Diagnosis not present

## 2017-05-19 DIAGNOSIS — F17210 Nicotine dependence, cigarettes, uncomplicated: Secondary | ICD-10-CM

## 2017-05-19 DIAGNOSIS — N764 Abscess of vulva: Secondary | ICD-10-CM

## 2017-05-19 DIAGNOSIS — Z170 Estrogen receptor positive status [ER+]: Secondary | ICD-10-CM | POA: Diagnosis not present

## 2017-05-19 DIAGNOSIS — L02229 Furuncle of trunk, unspecified: Secondary | ICD-10-CM

## 2017-05-19 DIAGNOSIS — Z79899 Other long term (current) drug therapy: Secondary | ICD-10-CM

## 2017-05-19 MED ORDER — SULFAMETHOXAZOLE-TRIMETHOPRIM 800-160 MG PO TABS
1.0000 | ORAL_TABLET | Freq: Two times a day (BID) | ORAL | 0 refills | Status: DC
Start: 2017-05-19 — End: 2017-08-04

## 2017-05-19 NOTE — Progress Notes (Signed)
Patient here today as acute add on for symptom management clinic. Patient reports pain and swelling to labia that started about 2 days ago.

## 2017-05-19 NOTE — Progress Notes (Signed)
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 After lengthy discussion with the patient, she is highly interested in breast conservation and wants to minimize the amount of surgery. Because of this, will proceed with neoadjuvant chemotherapy using Adriamycin, Cytoxan, and Taxol with Neulasta support. Once patient completes her chemotherapy, she will require lumpectomy followed by XRT as well as an aromatase inhibitor for 5 years. CT scan from April 13, 2017 did not reveal any metastatic disease. Pretreatment MUGA is adequate to proceed with an EF of 53%.  Interval history- Patient was last seen by Dr. Grayland Ormond on 05/12/17 for evaluation of cycle 2 of Adriamycin and Cytoxan. She continued to complain of mild bone pain secondary to her Neulasta. It was recommended for her to continue Claritin and Tylenol as directed.She felt well and was asymptomatic She denied any neurological complaints or any pain. Her appetite remained good and she offered no further complaints. She was mildly anxious. She was to return to clinic in 2 weeks for consideration of cycle 3.  Today, she presents for a painful boil/cyst located on her labia majora. It started as a sore area a few days ago and become progressively more painful over the past few days. She has tried taking tylenol  without relief and soaking in a warm bath. She has had a boil/cyst in the past in a similar location but does not remember it being this painful. She denies recent shaving or waxing of the area.Typically, it comes to a head and like a "pimple" and goes away. She states this one does not appear to be coming to a head and is getting larger and more painful. She is worried it may become infected. She denies any fevers. She denies chest pain or shortness of breath. She denies any additional pains and overall she feels like she is tolerating chemotherapy well.   ECOG FS:0 - Asymptomatic  Review of systems- Review of Systems  Constitutional: Negative for chills, fever, malaise/fatigue and weight loss.  HENT: Negative.   Eyes: Negative.   Respiratory: Negative.   Cardiovascular: Negative.   Gastrointestinal: Negative.   Genitourinary: Negative.   Musculoskeletal: Negative.   Skin: Negative.        Large boil/cyst on ride side of labia majora.   Neurological: Negative.  Negative for weakness.  Psychiatric/Behavioral: Negative.      Current treatment- 2 cycles of dose dense Adriamycin and Cytoxan with Neulasta support.  Cycle 3 due on November 6th, 2018.   Allergies  Allergen Reactions  . Hydrocodone-Acetaminophen Itching and Rash  . Penicillins Swelling, Other (See Comments) and Rash    Throat swelling Has patient had a PCN reaction causing immediate rash, facial/tongue/throat swelling, SOB or lightheadedness with hypotension: Yes Has patient had a PCN reaction causing severe rash involving mucus membranes or skin necrosis: No Has patient had a PCN reaction that required hospitalization: No Has patient had a PCN reaction occurring  within the last 10 years: No If all of the above answers are "NO", then may proceed with Cephalosporin use.      Past Medical History:  Diagnosis Date  . Arthritis   . Cancer ([place])   . GERD (gastroesophageal reflux disease)    NO MEDS  . Heart murmur   .  Hypertension   . Migraines   . Thumb fracture    LEFT-WEARING BRACE     Past Surgical History:  Procedure Laterality Date  . BREAST BIOPSY Right 03/19/2017   US guided biopsy of right breast and right lymph node  . CESAREAN SECTION    . FRACTURE SURGERY     left hand  . PILONIDAL CYST EXCISION  1980'S    Social History   Socioeconomic History  . Marital status: Widowed    Spouse name: Not on file  . Number of children: Not on file  . Years of education: Not on file  . Highest education level: Not on file  Social Needs  . Financial resource strain: Not on file  . Food insecurity - worry: Not on file  . Food insecurity - inability: Not on file  . Transportation needs - medical: Not on file  . Transportation needs - non-medical: Not on file  Occupational History  . Not on file  Tobacco Use  . Smoking status: Current Every Day Smoker    Packs/day: 2.00    Years: 18.00    Pack years: 36.00    Types: Cigarettes  . Smokeless tobacco: Never Used  . Tobacco comment: PT IS NOW DOWN TO 1/2 PACK SINCE CANCER DIAGNOSIS   Substance and Sexual Activity  . Alcohol use: No  . Drug use: No  . Sexual activity: Not on file  Other Topics Concern  . Not on file  Social History Narrative  . Not on file    Family History  Problem Relation Age of Onset  . Breast cancer Maternal Grandmother   . Hypertension Maternal Grandmother   . Angina Mother   . Lung cancer Father   . Diabetes Father   . Prostate cancer Maternal Uncle   . Dementia Maternal Grandfather   . Congestive Heart Failure Maternal Grandfather   . Heart disease Paternal Grandmother      Current Outpatient Medications:  .  acetaminophen (TYLENOL) 500 MG tablet, Take 1,000 mg by mouth every 8 (eight) hours as needed for mild pain., Disp: , Rfl:  .  bisoprolol-hydrochlorothiazide (ZIAC) 5-6.25 MG tablet, TAKE 1 TABLET BY MOUTH ONCE DAILY-AM, Disp: , Rfl:  .  buPROPion (WELLBUTRIN SR) 200 MG 12 hr tablet, Take 200 mg  by mouth 2 (two) times daily., Disp: , Rfl:  .  diclofenac sodium (VOLTAREN) 1 % GEL, APPLY 2 GRAMS TO AFFECTED AREA UP TO 4 TIMES A DAY AS NEEDED, Disp: , Rfl: 0 .  lidocaine-prilocaine (EMLA) cream, Apply to affected area once (Patient taking differently: Apply 1 application topically See admin instructions. Apply to port 2 hours prior to chemotherapy), Disp: 30 g, Rfl: 3 .  loratadine (CLARITIN) 10 MG tablet, Take 10 mg by mouth daily as needed for allergies., Disp: , Rfl:  .  ondansetron (ZOFRAN) 8 MG tablet, Take 1 tablet (8 mg total) by mouth 2 (two) times daily as needed. (Patient taking differently: Take 8 mg by mouth 2 (two) times daily as needed for nausea or vomiting. ), Disp: 60 tablet, Rfl: 2 .  prochlorperazine (COMPAZINE) 10 MG tablet, Take 1 tablet (10 mg  total) by mouth every 6 (six) hours as needed (Nausea or vomiting)., Disp: 60 tablet, Rfl: 2 .  traMADol (ULTRAM) 50 MG tablet, Take 50 mg by mouth daily. , Disp: , Rfl:  .  sulfamethoxazole-trimethoprim (BACTRIM DS,SEPTRA DS) 800-160 MG tablet, Take 1 tablet by mouth 2 (two) times daily., Disp: 14 tablet, Rfl: 0  Physical exam:  Vitals:   05/19/17 1412  BP: 127/86  Pulse: (!) 123  Resp: 20  Temp: (!) 97.5 F (36.4 C)  Weight: 184 lb 9.6 oz (83.7 kg)   Physical Exam  Constitutional: She is oriented to person, place, and time and well-developed, well-nourished, and in no distress.  HENT:  Head: Normocephalic and atraumatic.  Eyes: Pupils are equal, round, and reactive to light.  Neck: Normal range of motion. Neck supple.  Cardiovascular: Normal rate, regular rhythm, normal heart sounds and intact distal pulses.  Pulmonary/Chest: Effort normal and breath sounds normal.  Abdominal: Soft. Bowel sounds are normal.  Genitourinary: Vagina exhibits lesion.  Genitourinary Comments: Large skin colored furuncle located to the right of the vagina on the labia majora. There is no head present and is appears to be fluid/pus filled. It  is painful to touch per patient.   Musculoskeletal: Normal range of motion.  Neurological: She is alert and oriented to person, place, and time.  Skin: Skin is warm and dry.     CMP Latest Ref Rng & Units 05/12/2017  Glucose 65 - 99 mg/dL 153(H)  BUN 6 - 20 mg/dL 9  Creatinine 0.44 - 1.00 mg/dL 0.75  Sodium 135 - 145 mmol/L 140  Potassium 3.5 - 5.1 mmol/L 3.4(L)  Chloride 101 - 111 mmol/L 105  CO2 22 - 32 mmol/L 24  Calcium 8.9 - 10.3 mg/dL 8.8(L)  Total Protein 6.5 - 8.1 g/dL 6.5  Total Bilirubin 0.3 - 1.2 mg/dL 0.2(L)  Alkaline Phos 38 - 126 U/L 117  AST 15 - 41 U/L 26  ALT 14 - 54 U/L 16   CBC Latest Ref Rng & Units 05/12/2017  WBC 3.6 - 11.0 K/uL 13.9(H)  Hemoglobin 12.0 - 16.0 g/dL 11.1(L)  Hematocrit 35.0 - 47.0 % 34.3(L)  Platelets 150 - 440 K/uL 230    No images are attached to the encounter.  Dg Chest Port 1 View  Result Date: 04/27/2017 CLINICAL DATA:  Port-A-Cath placement EXAM: PORTABLE CHEST 1 VIEW COMPARISON:  04/15/2017 FINDINGS: Left Port-A-Cath in place with the tip in the lower SVC near the cavoatrial junction. No pneumothorax. Heart and mediastinal contours are within normal limits. No focal opacities or effusions. No acute bony abnormality. IMPRESSION: Left Port-A-Cath tip in the lower SVC. No pneumothorax. No active disease. Electronically Signed   By: Rolm Baptise M.D.   On: 04/27/2017 11:07   Dg C-arm 1-60 Min-no Report  Result Date: 04/27/2017 Fluoroscopy was utilized by the requesting physician.  No radiographic interpretation.    Assessment and plan- Patient is a 56 y.o. female who presents with a large furuncle to the right of vagina on labia majora. It his flesh colored. No exudate present. No fluctuant head present. It is painful to touch. Hard to identfy if area is has erythema due to color of skin but it clearly exhibits edema. She is afebrile. She is tachycardic (123).   1. Furuncle of the right labia majora: RX Bactrim 800-160 MG BID for seven  days. Due to the patient currently undergoing chemotherapy and the 10/10 pain, will prescribe an antibiotic. Encouraged warm compress to area to  possibly help come to a head. 2. Tachycardia: HR 123. Patient denies fever. She does state she is very nervous about being here given the location of the furuncle. After examination heart rate was reassessed (92) and was within normal limits. 3. RTC as scheduled on 05/26/17 for assessment prior to Cycle 3 of dose dense Adriamycin and Cytoxan with Neulasta support.   Visit Diagnosis 1. Furuncle of pubic region     Patient expressed understanding and was in agreement with this plan. She also understands that She can call clinic at any time with any questions, concerns, or complaints.    Marisue Humble [facility] Pager- [phone] 05/25/2017 3:31 PM

## 2017-05-20 NOTE — Progress Notes (Signed)
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  INTERVAL HISTORY: Patient returns to clinic today for evaluation and consideration of cycle 3 of 4 of Adriamycin and Cytoxan.  The boil on her labia has nearly resolved and she will finish her antibiotics tomorrow. She otherwise feels well and is asymptomatic. She has no neurologic complaints. She continues to have right hand pain secondary to fracture. She has a good appetite and denies weight loss. She has no chest pain or shortness of breath. She denies any nausea, vomiting, constipation, or diarrhea. She has no urinary complaints. Patient offers no specific complaints today.  REVIEW OF SYSTEMS:   Review of Systems  Constitutional: Negative.  Negative for fever, malaise/fatigue and weight loss.  Respiratory: Negative.  Negative for cough and shortness of breath.   Cardiovascular: Negative.  Negative for chest pain and leg swelling.  Gastrointestinal: Negative.  Negative for abdominal pain.  Genitourinary: Negative.   Musculoskeletal: Positive for joint pain.  Skin: Negative for rash.  Neurological: Negative.  Negative for sensory change and weakness.  Psychiatric/Behavioral: The patient is nervous/anxious.     As per HPI. Otherwise, a complete review of systems is negative.  PAST MEDICAL HISTORY: Past Medical History:  Diagnosis Date  . Arthritis   . Cancer ([facility])   . GERD (gastroesophageal reflux disease)    NO MEDS  . Heart murmur   . Hypertension   . Migraines   . Thumb fracture    LEFT-WEARING BRACE    PAST SURGICAL HISTORY: Past Surgical History:  Procedure Laterality Date  . BREAST BIOPSY Right 03/19/2017   US  guided biopsy of right breast and right lymph node  . CESAREAN SECTION    . FRACTURE SURGERY     left hand  . PILONIDAL CYST EXCISION  1980'S    FAMILY HISTORY: Family History  Problem Relation Age of Onset  . Breast cancer Maternal Grandmother   . Hypertension Maternal Grandmother   . Angina Mother   . Lung cancer Father   . Diabetes Father   . Prostate cancer Maternal Uncle   . Dementia Maternal Grandfather   . Congestive Heart Failure Maternal Grandfather   . Heart disease Paternal Grandmother     ADVANCED DIRECTIVES (Y/N):  N  HEALTH MAINTENANCE: Social History   Tobacco Use  . Smoking status: Current Every Day Smoker    Packs/day: 2.00    Years: 18.00    Pack years: 36.00    Types: Cigarettes  . Smokeless tobacco: Never Used  . Tobacco comment: PT IS NOW DOWN TO 1/2 PACK SINCE CANCER DIAGNOSIS   Substance Use Topics  . Alcohol use: No  . Drug use: No     Colonoscopy:  PAP:  Bone density:  Lipid panel:  Allergies  Allergen Reactions  . Hydrocodone-Acetaminophen Itching and Rash  . Penicillins Swelling, Other (See Comments) and Rash    Throat swelling Has patient had a PCN reaction causing immediate rash, facial/tongue/throat swelling, SOB or lightheadedness with hypotension: Yes Has patient had a PCN reaction causing severe rash involving mucus membranes or skin necrosis: No Has patient had a PCN reaction that required hospitalization: No Has patient had a PCN reaction occurring within the last 10 years: No  If all of the above answers are "NO", then may proceed with Cephalosporin use.     Current Outpatient Medications  Medication Sig Dispense Refill  . acetaminophen (TYLENOL) 500 MG tablet Take 1,000 mg by mouth every 8 (eight) hours as needed for mild pain.    . bisoprolol-hydrochlorothiazide (ZIAC) 5-6.25 MG tablet TAKE 1 TABLET BY MOUTH ONCE DAILY-AM    . buPROPion (WELLBUTRIN SR) 200 MG 12 hr tablet Take 200 mg by mouth 2 (two) times daily.    .  diclofenac sodium (VOLTAREN) 1 % GEL APPLY 2 GRAMS TO AFFECTED AREA UP TO 4 TIMES A DAY AS NEEDED  0  . lidocaine-prilocaine (EMLA) cream Apply to affected area once 30 g 3  . loratadine (CLARITIN) 10 MG tablet Take 10 mg by mouth daily as needed for allergies.    Marland Kitchen sulfamethoxazole-trimethoprim (BACTRIM DS,SEPTRA DS) 800-160 MG tablet Take 1 tablet by mouth 2 (two) times daily. 14 tablet 0  . traMADol (ULTRAM) 50 MG tablet Take 50 mg by mouth daily.     . ondansetron (ZOFRAN) 8 MG tablet Take 1 tablet (8 mg total) by mouth 2 (two) times daily as needed. (Patient not taking: Reported on 05/26/2017) 60 tablet 2  . prochlorperazine (COMPAZINE) 10 MG tablet Take 1 tablet (10 mg total) by mouth every 6 (six) hours as needed (Nausea or vomiting). (Patient not taking: Reported on 05/26/2017) 60 tablet 2   No current facility-administered medications for this visit.    Facility-Administered Medications Ordered in Other Visits  Medication Dose Route Frequency Provider Last Rate Last Dose  . heparin lock flush 100 unit/mL  500 Units Intravenous Once Lloyd Huger, MD      . sodium chloride flush (NS) 0.9 % injection 10 mL  10 mL Intravenous PRN Lloyd Huger, MD   10 mL at 05/26/17 0907    OBJECTIVE: Vitals:   05/26/17 0927  BP: 126/73  Pulse: 80  Resp: 18  Temp: 97.6 F (36.4 C)     Body mass index is 32.2 kg/m.    ECOG FS:0 - Asymptomatic  General: Well-developed, well-nourished, no acute distress. Eyes: Pink conjunctiva, anicteric sclera. Breasts: Patient requested exam be deferred today. Lungs: Clear to auscultation bilaterally. Heart: Regular rate and rhythm. No rubs, murmurs, or gallops. Abdomen: Soft, nontender, nondistended. No organomegaly noted, normoactive bowel sounds. Musculoskeletal: No edema, cyanosis, or clubbing. Neuro: Alert, answering all questions appropriately. Cranial nerves grossly intact. Skin: No rashes or petechiae noted. Psych: Normal affect.   LAB  RESULTS:  Lab Results  Component Value Date   NA 137 05/26/2017   K 3.8 05/26/2017   CL 105 05/26/2017   CO2 23 05/26/2017   GLUCOSE 149 (H) 05/26/2017   BUN 10 05/26/2017   CREATININE 0.90 05/26/2017   CALCIUM 8.8 (L) 05/26/2017   PROT 6.5 05/26/2017   ALBUMIN 3.6 05/26/2017   AST 20 05/26/2017   ALT 13 (L) 05/26/2017   ALKPHOS 115 05/26/2017   BILITOT 0.2 (L) 05/26/2017   GFRNONAA >60 05/26/2017   GFRAA >60 05/26/2017    Lab Results  Component Value Date   WBC 16.0 (H) 05/26/2017   NEUTROABS 12.8 (H) 05/26/2017   HGB 10.2 (L) 05/26/2017   HCT 31.4 (L) 05/26/2017   MCV 69.0 (L) 05/26/2017   PLT 333 05/26/2017     STUDIES: Dg Chest Port 1 View  Result Date: 04/27/2017 CLINICAL DATA:  Port-A-Cath placement EXAM: PORTABLE CHEST 1 VIEW COMPARISON:  04/15/2017 FINDINGS: Left Port-A-Cath in place  with the tip in the lower SVC near the cavoatrial junction. No pneumothorax. Heart and mediastinal contours are within normal limits. No focal opacities or effusions. No acute bony abnormality. IMPRESSION: Left Port-A-Cath tip in the lower SVC. No pneumothorax. No active disease. Electronically Signed   By: Rolm Baptise M.D.   On: 04/27/2017 11:07   Dg C-arm 1-60 Min-no Report  Result Date: 04/27/2017 Fluoroscopy was utilized by the requesting physician.  No radiographic interpretation.    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  PLAN:    1. 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 After lengthy discussion with the patient, she is highly interested in breast conservation and wants to minimize the amount of surgery. Because of this, will proceed with neoadjuvant chemotherapy using Adriamycin, Cytoxan, and Taxol with Neulasta  support. Once patient completes her chemotherapy, she will require lumpectomy followed by XRT as well as an aromatase inhibitor for 5 years. CT scan from April 13, 2017 did not reveal any metastatic disease. Pretreatment MUGA is adequate to proceed with an EF of 53%. Proceed with cycle 3 of 4 of dose dense Adriamycin and Cytoxan with Neulasta support today. Return to clinic in 2 weeks for consideration of cycle 4.  Patient will then return to clinic on June 23, 2017 for cycle 1 of 12 of weekly Taxol. 2. Right hand fracture: Continue monitoring treatment per orthopedics.  Proceed with injections for pain as needed.  Case discussed with Dr. Grandville Silos.   3. Neutropenia: Resolved. Secondary to chemotherapy. Neulasta as above. 4. Bone pain: Secondary to Neulasta. Continue Claritin and Tylenol as directed. 5.  Labial boil: Resolved.  Patient has been instructed to complete her antibiotic course as prescribed.   Patient expressed understanding and was in agreement with this plan. She also understands that She can call clinic at any time with any questions, concerns, or complaints.   Cancer Staging Primary cancer of upper outer quadrant of right female breast [facility]) Staging form: Breast, AJCC 8th Edition - Clinical stage from 03/27/2017: Stage IB (cT1c, cN1, cM0, G2, ER: Positive, PR: Positive, HER2: Negative) - Signed by Lloyd Huger, MD on 03/27/2017   Lloyd Huger, MD   05/26/2017 9:59 AM

## 2017-05-26 ENCOUNTER — Inpatient Hospital Stay: Payer: BLUE CROSS/BLUE SHIELD | Attending: Oncology

## 2017-05-26 ENCOUNTER — Inpatient Hospital Stay: Payer: BLUE CROSS/BLUE SHIELD

## 2017-05-26 ENCOUNTER — Inpatient Hospital Stay (HOSPITAL_BASED_OUTPATIENT_CLINIC_OR_DEPARTMENT_OTHER): Payer: BLUE CROSS/BLUE SHIELD | Admitting: Oncology

## 2017-05-26 VITALS — BP 126/73 | HR 80 | Temp 97.6°F | Resp 18 | Wt 187.6 lb

## 2017-05-26 DIAGNOSIS — I10 Essential (primary) hypertension: Secondary | ICD-10-CM | POA: Diagnosis not present

## 2017-05-26 DIAGNOSIS — Z170 Estrogen receptor positive status [ER+]: Secondary | ICD-10-CM

## 2017-05-26 DIAGNOSIS — Z79899 Other long term (current) drug therapy: Secondary | ICD-10-CM | POA: Diagnosis not present

## 2017-05-26 DIAGNOSIS — F17210 Nicotine dependence, cigarettes, uncomplicated: Secondary | ICD-10-CM | POA: Diagnosis not present

## 2017-05-26 DIAGNOSIS — R21 Rash and other nonspecific skin eruption: Secondary | ICD-10-CM | POA: Diagnosis not present

## 2017-05-26 DIAGNOSIS — F329 Major depressive disorder, single episode, unspecified: Secondary | ICD-10-CM | POA: Diagnosis not present

## 2017-05-26 DIAGNOSIS — Z5111 Encounter for antineoplastic chemotherapy: Secondary | ICD-10-CM | POA: Diagnosis not present

## 2017-05-26 DIAGNOSIS — K219 Gastro-esophageal reflux disease without esophagitis: Secondary | ICD-10-CM | POA: Insufficient documentation

## 2017-05-26 DIAGNOSIS — C50411 Malignant neoplasm of upper-outer quadrant of right female breast: Secondary | ICD-10-CM

## 2017-05-26 DIAGNOSIS — F419 Anxiety disorder, unspecified: Secondary | ICD-10-CM | POA: Insufficient documentation

## 2017-05-26 LAB — COMPREHENSIVE METABOLIC PANEL
ALK PHOS: 115 U/L (ref 38–126)
ALT: 13 U/L — AB (ref 14–54)
AST: 20 U/L (ref 15–41)
Albumin: 3.6 g/dL (ref 3.5–5.0)
Anion gap: 9 (ref 5–15)
BUN: 10 mg/dL (ref 6–20)
CALCIUM: 8.8 mg/dL — AB (ref 8.9–10.3)
CO2: 23 mmol/L (ref 22–32)
CREATININE: 0.9 mg/dL (ref 0.44–1.00)
Chloride: 105 mmol/L (ref 101–111)
Glucose, Bld: 149 mg/dL — ABNORMAL HIGH (ref 65–99)
Potassium: 3.8 mmol/L (ref 3.5–5.1)
Sodium: 137 mmol/L (ref 135–145)
Total Bilirubin: 0.2 mg/dL — ABNORMAL LOW (ref 0.3–1.2)
Total Protein: 6.5 g/dL (ref 6.5–8.1)

## 2017-05-26 LAB — CBC WITH DIFFERENTIAL/PLATELET
BASOS PCT: 0 %
Basophils Absolute: 0 10*3/uL (ref 0–0.1)
EOS ABS: 0 10*3/uL (ref 0–0.7)
EOS PCT: 0 %
HCT: 31.4 % — ABNORMAL LOW (ref 35.0–47.0)
HEMOGLOBIN: 10.2 g/dL — AB (ref 12.0–16.0)
Lymphocytes Relative: 13 %
Lymphs Abs: 2.1 10*3/uL (ref 1.0–3.6)
MCH: 22.4 pg — ABNORMAL LOW (ref 26.0–34.0)
MCHC: 32.5 g/dL (ref 32.0–36.0)
MCV: 69 fL — ABNORMAL LOW (ref 80.0–100.0)
MONOS PCT: 6 %
Monocytes Absolute: 1 10*3/uL — ABNORMAL HIGH (ref 0.2–0.9)
NEUTROS PCT: 81 %
Neutro Abs: 12.8 10*3/uL — ABNORMAL HIGH (ref 1.4–6.5)
PLATELETS: 333 10*3/uL (ref 150–440)
RBC: 4.55 MIL/uL (ref 3.80–5.20)
RDW: 13.7 % (ref 11.5–14.5)
WBC: 16 10*3/uL — AB (ref 3.6–11.0)

## 2017-05-26 MED ORDER — DOXORUBICIN HCL CHEMO IV INJECTION 2 MG/ML
60.0000 mg/m2 | Freq: Once | INTRAVENOUS | Status: AC
  Administered 2017-05-26: 118 mg via INTRAVENOUS
  Filled 2017-05-26: qty 59

## 2017-05-26 MED ORDER — SODIUM CHLORIDE 0.9% FLUSH
10.0000 mL | INTRAVENOUS | Status: DC | PRN
  Administered 2017-05-26: 10 mL via INTRAVENOUS
  Filled 2017-05-26: qty 10

## 2017-05-26 MED ORDER — HEPARIN SOD (PORK) LOCK FLUSH 100 UNIT/ML IV SOLN
500.0000 [IU] | Freq: Once | INTRAVENOUS | Status: AC
  Administered 2017-05-26: 500 [IU] via INTRAVENOUS
  Filled 2017-05-26: qty 5

## 2017-05-26 MED ORDER — SODIUM CHLORIDE 0.9 % IV SOLN
Freq: Once | INTRAVENOUS | Status: AC
  Administered 2017-05-26: 10:00:00 via INTRAVENOUS
  Filled 2017-05-26: qty 1000

## 2017-05-26 MED ORDER — SODIUM CHLORIDE 0.9 % IV SOLN
600.0000 mg/m2 | Freq: Once | INTRAVENOUS | Status: AC
  Administered 2017-05-26: 1180 mg via INTRAVENOUS
  Filled 2017-05-26: qty 9

## 2017-05-26 MED ORDER — PALONOSETRON HCL INJECTION 0.25 MG/5ML
0.2500 mg | Freq: Once | INTRAVENOUS | Status: AC
  Administered 2017-05-26: 0.25 mg via INTRAVENOUS
  Filled 2017-05-26: qty 5

## 2017-05-26 MED ORDER — HEPARIN SOD (PORK) LOCK FLUSH 100 UNIT/ML IV SOLN: 500.0000 [IU] | Freq: Once | INTRAVENOUS | Status: DC | PRN

## 2017-05-26 MED ORDER — PEGFILGRASTIM 6 MG/0.6ML ~~LOC~~ PSKT
6.0000 mg | PREFILLED_SYRINGE | Freq: Once | SUBCUTANEOUS | Status: AC
  Administered 2017-05-26: 6 mg via SUBCUTANEOUS
  Filled 2017-05-26: qty 0.6

## 2017-05-26 MED ORDER — FOSAPREPITANT DIMEGLUMINE INJECTION 150 MG
Freq: Once | INTRAVENOUS | Status: AC
  Administered 2017-05-26: 11:00:00 via INTRAVENOUS
  Filled 2017-05-26: qty 5

## 2017-06-08 NOTE — Progress Notes (Signed)
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  INTERVAL HISTORY: Patient returns to clinic today for evaluation and consideration of cycle 4 of 4 of Adriamycin and Cytoxan. The boil on her labia has resolved. She has a new complaint of rash around her mouth that began approximately 1 week ago. Patient states rash is not pruritic or painful. She admits to beginning  to use a new moisturizer on her face approximately when the rash began. Additionally she admits to occasionally "overly exerting herself" resulting in shortness of breath. This resolves with rest. She states this began once beginning chemotherapy. She also admits to occasional "highs and lows" with her emotions. She has not been taking Wellbutrin consistently.She otherwise feels well and is asymptomatic. She has no neurologic complaints. She continues to have right hand pain secondary to fracture but this is better. She has a good appetite and denies weight loss. She has no chest pain or shortness of breath. She denies any nausea, vomiting, constipation, or diarrhea. She has no urinary complaints. Patient offers no specific complaints today.  REVIEW OF SYSTEMS:   Review of Systems  Constitutional: Positive for malaise/fatigue. Negative for fever and weight loss.  Respiratory: Positive for shortness of breath. Negative for cough.        With exertion   Cardiovascular: Negative.  Negative for chest pain and leg swelling.  Gastrointestinal: Negative.  Negative for abdominal pain.  Genitourinary: Negative.   Musculoskeletal: Positive for joint pain.  Skin: Positive for rash.       Around mouth  Neurological: Positive for  weakness. Negative for sensory change.  Psychiatric/Behavioral: The patient is nervous/anxious.     As per HPI. Otherwise, a complete review of systems is negative.  PAST MEDICAL HISTORY: Past Medical History:  Diagnosis Date  . Arthritis   . Cancer ([place])   . GERD (gastroesophageal reflux disease)    NO MEDS  . Heart murmur   . Hypertension   . Migraines   . Thumb fracture    LEFT-WEARING BRACE    PAST SURGICAL HISTORY: Past Surgical History:  Procedure Laterality Date  . BREAST BIOPSY Right 03/19/2017   US guided biopsy of right breast and right lymph node  . CESAREAN SECTION    . FRACTURE SURGERY     left hand  . PILONIDAL CYST EXCISION  1980'S  . PORT-A-CATH REMOVAL N/A 04/27/2017   Procedure: REMOVAL OF PORT A CATH & insertion PORT A CATH LEFT INTERNAL JUGULAR;  Surgeon: Herbert Pun, MD;  Location: [facility] ORS;  Service: General;  Laterality: N/A;  . PORTACATH PLACEMENT N/A 04/15/2017   Procedure: INSERTION PORT-A-CATH;  Surgeon: Herbert Pun, MD;  Location: [facility] ORS;  Service: General;  Laterality: N/A;    FAMILY HISTORY: Family History  Problem Relation Age of Onset  . Breast cancer Maternal Grandmother   . Hypertension Maternal Grandmother   . Angina Mother   . Lung cancer Father   . Diabetes Father   . Prostate cancer Maternal Uncle   . Dementia Maternal Grandfather   . Congestive Heart Failure Maternal Grandfather   . Heart disease Paternal Grandmother     ADVANCED DIRECTIVES (Y/N):  N  HEALTH MAINTENANCE: Social History   Tobacco Use  .  Smoking status: Current Every Day Smoker    Packs/day: 2.00    Years: 18.00    Pack years: 36.00    Types: Cigarettes  . Smokeless tobacco: Never Used  . Tobacco comment: PT IS NOW DOWN TO 1/2 PACK SINCE CANCER DIAGNOSIS   Substance Use Topics  . Alcohol use: No  . Drug use: No     Colonoscopy:  PAP:  Bone density:  Lipid panel:  Allergies  Allergen Reactions  . Hydrocodone-Acetaminophen  Itching and Rash  . Penicillins Swelling, Other (See Comments) and Rash    Throat swelling Has patient had a PCN reaction causing immediate rash, facial/tongue/throat swelling, SOB or lightheadedness with hypotension: Yes Has patient had a PCN reaction causing severe rash involving mucus membranes or skin necrosis: No Has patient had a PCN reaction that required hospitalization: No Has patient had a PCN reaction occurring within the last 10 years: No If all of the above answers are "NO", then may proceed with Cephalosporin use.     Current Outpatient Medications  Medication Sig Dispense Refill  . acetaminophen (TYLENOL) 500 MG tablet Take 1,000 mg by mouth every 8 (eight) hours as needed for mild pain.    . bisoprolol-hydrochlorothiazide (ZIAC) 5-6.25 MG tablet TAKE 1 TABLET BY MOUTH ONCE DAILY-AM    . buPROPion (WELLBUTRIN SR) 200 MG 12 hr tablet Take 200 mg by mouth 2 (two) times daily.    . diclofenac sodium (VOLTAREN) 1 % GEL APPLY 2 GRAMS TO AFFECTED AREA UP TO 4 TIMES A DAY AS NEEDED  0  . lidocaine-prilocaine (EMLA) cream Apply to affected area once 30 g 3  . loratadine (CLARITIN) 10 MG tablet Take 10 mg by mouth daily as needed for allergies.    Marland Kitchen traMADol (ULTRAM) 50 MG tablet Take 50 mg by mouth daily.     . ondansetron (ZOFRAN) 8 MG tablet Take 1 tablet (8 mg total) by mouth 2 (two) times daily as needed. (Patient not taking: Reported on 05/26/2017) 60 tablet 2  . prochlorperazine (COMPAZINE) 10 MG tablet Take 1 tablet (10 mg total) by mouth every 6 (six) hours as needed (Nausea or vomiting). (Patient not taking: Reported on 05/26/2017) 60 tablet 2  . sulfamethoxazole-trimethoprim (BACTRIM DS,SEPTRA DS) 800-160 MG tablet Take 1 tablet by mouth 2 (two) times daily. (Patient not taking: Reported on 06/09/2017) 14 tablet 0   No current facility-administered medications for this visit.     OBJECTIVE: Vitals:   06/09/17 0959  BP: (!) 145/87  Pulse: 73  Resp: 20  Temp: 98 F (36.7  C)     Body mass index is 32.29 kg/m.    ECOG FS:0 - Asymptomatic  General: Well-developed, well-nourished, no acute distress. Eyes: Pink conjunctiva, anicteric sclera. Breasts: Patient requested exam be deferred today. Lungs: Clear to auscultation bilaterally. Heart: Regular rate and rhythm. No rubs, murmurs, or gallops. Abdomen: Soft, nontender, nondistended. No organomegaly noted, normoactive bowel sounds. Musculoskeletal: No edema, cyanosis, or clubbing. Neuro: Alert, answering all questions appropriately. Cranial nerves grossly intact. Skin: Multiple small raised pigmented macules surrounding mouth.  Psych: Normal affect.   LAB RESULTS:  Lab Results  Component Value Date   NA 140 06/09/2017   K 3.4 (L) 06/09/2017   CL 106 06/09/2017   CO2 25 06/09/2017   GLUCOSE 120 (H) 06/09/2017   BUN 6 06/09/2017   CREATININE 0.65 06/09/2017   CALCIUM 8.8 (L) 06/09/2017   PROT 6.5 06/09/2017   ALBUMIN 3.7 06/09/2017   AST 21 06/09/2017  ALT 11 (L) 06/09/2017   ALKPHOS 118 06/09/2017   BILITOT 0.2 (L) 06/09/2017   GFRNONAA >60 06/09/2017   GFRAA >60 06/09/2017    Lab Results  Component Value Date   WBC 14.9 (H) 06/09/2017   NEUTROABS 11.8 (H) 06/09/2017   HGB 9.2 (L) 06/09/2017   HCT 27.9 (L) 06/09/2017   MCV 68.8 (L) 06/09/2017   PLT 326 06/09/2017     STUDIES: No results found.  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  PLAN:    1. 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 After lengthy discussion with the patient, she is highly interested in breast conservation and wants to minimize the amount of surgery. Because of this, will proceed with neoadjuvant chemotherapy using Adriamycin, Cytoxan, and Taxol with Neulasta  support. Once patient completes her chemotherapy, she will require lumpectomy followed by XRT as well as an aromatase inhibitor for 5 years. CT scan from April 13, 2017 did not reveal any metastatic disease. Pretreatment MUGA is adequate to proceed with an EF of 53%. Proceed with cycle 4 of 4 of dose dense Adriamycin and Cytoxan with Neulasta support today. Return to clinic in 2 weeks for consideration of cycle 1 of weekly Taxol. 2. Right hand fracture: Continue monitoring treatment per orthopedics.  Proceed with injections for pain as needed.  Case discussed with Dr. Grandville Silos.   3. Leukocytosis: Patient receives Neulasta with treatment.  4. Bone pain: Secondary to Neulasta. Continue Claritin and Tylenol as directed. 5. Labial boil: Resolved.  6. Rash: Encouraged to stop new face lotion and to use an moisturizer that is bland.  7. Fatigue/Shortness of breath with exertion: Patient encouraged to have planned "rest" time. 8. Depression/Anxiety: Patient encouraged to take Wellbutrin daily and to not take " as needed". She was educated on the mechanism of action and that Wellbutrin can take weeks to reach a therapeutic level.   Patient expressed understanding and was in agreement with this plan. She also understands that She can call clinic at any time with any questions, concerns, or complaints.   Cancer Staging Primary cancer of upper outer quadrant of right female breast [facility]) Staging form: Breast, AJCC 8th Edition - Clinical stage from 03/27/2017: Stage IB (cT1c, cN1, cM0, G2, ER: Positive, PR: Positive, HER2: Negative) - Signed by Lloyd Huger, MD on 03/27/2017   Jacquelin Hawking, NP   06/09/2017 3:37 PM

## 2017-06-09 ENCOUNTER — Inpatient Hospital Stay (HOSPITAL_BASED_OUTPATIENT_CLINIC_OR_DEPARTMENT_OTHER): Payer: BLUE CROSS/BLUE SHIELD | Admitting: Oncology

## 2017-06-09 ENCOUNTER — Encounter: Payer: Self-pay | Admitting: Oncology

## 2017-06-09 ENCOUNTER — Inpatient Hospital Stay: Payer: BLUE CROSS/BLUE SHIELD

## 2017-06-09 ENCOUNTER — Other Ambulatory Visit: Payer: Self-pay

## 2017-06-09 VITALS — BP 145/87 | HR 73 | Temp 98.0°F | Resp 20 | Wt 188.1 lb

## 2017-06-09 DIAGNOSIS — C50411 Malignant neoplasm of upper-outer quadrant of right female breast: Secondary | ICD-10-CM

## 2017-06-09 DIAGNOSIS — F17210 Nicotine dependence, cigarettes, uncomplicated: Secondary | ICD-10-CM

## 2017-06-09 DIAGNOSIS — Z79899 Other long term (current) drug therapy: Secondary | ICD-10-CM | POA: Diagnosis not present

## 2017-06-09 DIAGNOSIS — F329 Major depressive disorder, single episode, unspecified: Secondary | ICD-10-CM

## 2017-06-09 DIAGNOSIS — Z170 Estrogen receptor positive status [ER+]: Secondary | ICD-10-CM | POA: Diagnosis not present

## 2017-06-09 DIAGNOSIS — F419 Anxiety disorder, unspecified: Secondary | ICD-10-CM

## 2017-06-09 DIAGNOSIS — R21 Rash and other nonspecific skin eruption: Secondary | ICD-10-CM | POA: Diagnosis not present

## 2017-06-09 LAB — CBC WITH DIFFERENTIAL/PLATELET
Basophils Absolute: 0.1 10*3/uL (ref 0–0.1)
Basophils Relative: 0 %
Eosinophils Absolute: 0 10*3/uL (ref 0–0.7)
Eosinophils Relative: 0 %
HCT: 27.9 % — ABNORMAL LOW (ref 35.0–47.0)
HEMOGLOBIN: 9.2 g/dL — AB (ref 12.0–16.0)
LYMPHS ABS: 1.6 10*3/uL (ref 1.0–3.6)
Lymphocytes Relative: 11 %
MCH: 22.6 pg — AB (ref 26.0–34.0)
MCHC: 32.9 g/dL (ref 32.0–36.0)
MCV: 68.8 fL — ABNORMAL LOW (ref 80.0–100.0)
MONOS PCT: 10 %
Monocytes Absolute: 1.4 10*3/uL — ABNORMAL HIGH (ref 0.2–0.9)
NEUTROS ABS: 11.8 10*3/uL — AB (ref 1.4–6.5)
NEUTROS PCT: 79 %
Platelets: 326 10*3/uL (ref 150–440)
RBC: 4.06 MIL/uL (ref 3.80–5.20)
RDW: 14 % (ref 11.5–14.5)
WBC: 14.9 10*3/uL — ABNORMAL HIGH (ref 3.6–11.0)

## 2017-06-09 LAB — COMPREHENSIVE METABOLIC PANEL
ALT: 11 U/L — ABNORMAL LOW (ref 14–54)
ANION GAP: 9 (ref 5–15)
AST: 21 U/L (ref 15–41)
Albumin: 3.7 g/dL (ref 3.5–5.0)
Alkaline Phosphatase: 118 U/L (ref 38–126)
BUN: 6 mg/dL (ref 6–20)
CHLORIDE: 106 mmol/L (ref 101–111)
CO2: 25 mmol/L (ref 22–32)
Calcium: 8.8 mg/dL — ABNORMAL LOW (ref 8.9–10.3)
Creatinine, Ser: 0.65 mg/dL (ref 0.44–1.00)
GFR calc non Af Amer: >60 mL/min (ref >60)
Glucose, Bld: 120 mg/dL — ABNORMAL HIGH (ref 65–99)
Potassium: 3.4 mmol/L — ABNORMAL LOW (ref 3.5–5.1)
SODIUM: 140 mmol/L (ref 135–145)
Total Bilirubin: 0.2 mg/dL — ABNORMAL LOW (ref 0.3–1.2)
Total Protein: 6.5 g/dL (ref 6.5–8.1)

## 2017-06-09 MED ORDER — PEGFILGRASTIM 6 MG/0.6ML ~~LOC~~ PSKT
6.0000 mg | PREFILLED_SYRINGE | Freq: Once | SUBCUTANEOUS | Status: AC
  Administered 2017-06-09: 6 mg via SUBCUTANEOUS
  Filled 2017-06-09: qty 0.6

## 2017-06-09 MED ORDER — SODIUM CHLORIDE 0.9% FLUSH
10.0000 mL | INTRAVENOUS | Status: DC | PRN
  Administered 2017-06-09: 10 mL
  Filled 2017-06-09: qty 10

## 2017-06-09 MED ORDER — PALONOSETRON HCL INJECTION 0.25 MG/5ML
0.2500 mg | Freq: Once | INTRAVENOUS | Status: AC
  Administered 2017-06-09: 0.25 mg via INTRAVENOUS
  Filled 2017-06-09: qty 5

## 2017-06-09 MED ORDER — HEPARIN SOD (PORK) LOCK FLUSH 100 UNIT/ML IV SOLN
500.0000 [IU] | Freq: Once | INTRAVENOUS | Status: AC | PRN
  Administered 2017-06-09: 500 [IU]
  Filled 2017-06-09: qty 5

## 2017-06-09 MED ORDER — SODIUM CHLORIDE 0.9 % IV SOLN
Freq: Once | INTRAVENOUS | Status: AC
  Administered 2017-06-09: 11:00:00 via INTRAVENOUS
  Filled 2017-06-09: qty 5

## 2017-06-09 MED ORDER — SODIUM CHLORIDE 0.9 % IV SOLN
600.0000 mg/m2 | Freq: Once | INTRAVENOUS | Status: AC
  Administered 2017-06-09: 1180 mg via INTRAVENOUS
  Filled 2017-06-09: qty 50

## 2017-06-09 MED ORDER — SODIUM CHLORIDE 0.9 % IV SOLN
Freq: Once | INTRAVENOUS | Status: AC
  Administered 2017-06-09: 10:00:00 via INTRAVENOUS
  Filled 2017-06-09: qty 1000

## 2017-06-09 MED ORDER — DOXORUBICIN HCL CHEMO IV INJECTION 2 MG/ML
60.0000 mg/m2 | Freq: Once | INTRAVENOUS | Status: AC
  Administered 2017-06-09: 118 mg via INTRAVENOUS
  Filled 2017-06-09: qty 59

## 2017-06-09 NOTE — Progress Notes (Signed)
Patient reports rash around mouth and chin today, also reports her mouth is sore.

## 2017-06-10 ENCOUNTER — Telehealth: Payer: Self-pay | Admitting: *Deleted

## 2017-06-10 NOTE — Telephone Encounter (Signed)
Patient reports that prescription for mouth wash did not get sent to [facility]

## 2017-06-10 NOTE — Telephone Encounter (Signed)
Prescription faxed to [facility] in [place].

## 2017-06-10 NOTE — Telephone Encounter (Signed)
That's because I did not send one. I would be happy to write for this though.

## 2017-06-21 NOTE — Progress Notes (Signed)
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  INTERVAL HISTORY: Patient returns to clinic today for evaluation and consideration of cycle 1 of 12 of weekly Taxol.  She has some dry pruritic skin, but otherwise feels well. She has no neurologic complaints. She continues to have right hand pain secondary to fracture. She has a good appetite and denies weight loss. She has no chest pain or shortness of breath. She denies any nausea, vomiting, constipation, or diarrhea. She has no urinary complaints. Patient offers no further specific complaints today.  REVIEW OF SYSTEMS:   Review of Systems  Constitutional: Negative.  Negative for fever, malaise/fatigue and weight loss.  Respiratory: Negative.  Negative for cough and shortness of breath.   Cardiovascular: Negative.  Negative for chest pain and leg swelling.  Gastrointestinal: Negative.  Negative for abdominal pain.  Genitourinary: Negative.   Musculoskeletal: Positive for joint pain.  Skin: Positive for itching. Negative for rash.  Neurological: Negative.  Negative for sensory change and weakness.  Psychiatric/Behavioral: The patient is nervous/anxious.     As per HPI. Otherwise, a complete review of systems is negative.  PAST MEDICAL HISTORY: Past Medical History:  Diagnosis Date  . Arthritis   . Cancer ([place])   . GERD (gastroesophageal reflux disease)    NO MEDS  . Heart murmur   . Hypertension   . Migraines   . Thumb fracture    LEFT-WEARING BRACE    PAST SURGICAL HISTORY: Past Surgical History:  Procedure Laterality Date  . BREAST BIOPSY Right 03/19/2017   US guided biopsy of right breast and right lymph node  .  CESAREAN SECTION    . FRACTURE SURGERY     left hand  . PILONIDAL CYST EXCISION  1980'S  . PORT-A-CATH REMOVAL N/A 04/27/2017   Procedure: REMOVAL OF PORT A CATH & insertion PORT A CATH LEFT INTERNAL JUGULAR;  Surgeon: Herbert Pun, MD;  Location: [facility] ORS;  Service: General;  Laterality: N/A;  . PORTACATH PLACEMENT N/A 04/15/2017   Procedure: INSERTION PORT-A-CATH;  Surgeon: Herbert Pun, MD;  Location: [facility] ORS;  Service: General;  Laterality: N/A;    FAMILY HISTORY: Family History  Problem Relation Age of Onset  . Breast cancer Maternal Grandmother   . Hypertension Maternal Grandmother   . Angina Mother   . Lung cancer Father   . Diabetes Father   . Prostate cancer Maternal Uncle   . Dementia Maternal Grandfather   . Congestive Heart Failure Maternal Grandfather   . Heart disease Paternal Grandmother     ADVANCED DIRECTIVES (Y/N):  N  HEALTH MAINTENANCE: Social History   Tobacco Use  . Smoking status: Current Every Day Smoker    Packs/day: 2.00    Years: 18.00    Pack years: 36.00    Types: Cigarettes  . Smokeless tobacco: Never Used  . Tobacco comment: PT IS NOW DOWN TO 1/2 PACK SINCE CANCER DIAGNOSIS   Substance Use Topics  . Alcohol use: No  . Drug use: No     Colonoscopy:  PAP:  Bone density:  Lipid panel:  Allergies  Allergen Reactions  . Hydrocodone-Acetaminophen Itching and Rash  . Penicillins Swelling, Other (See Comments) and Rash    Throat swelling Has patient  had a PCN reaction causing immediate rash, facial/tongue/throat swelling, SOB or lightheadedness with hypotension: Yes Has patient had a PCN reaction causing severe rash involving mucus membranes or skin necrosis: No Has patient had a PCN reaction that required hospitalization: No Has patient had a PCN reaction occurring within the last 10 years: No If all of the above answers are "NO", then may proceed with Cephalosporin use.     Current Outpatient Medications    Medication Sig Dispense Refill  . acetaminophen (TYLENOL) 500 MG tablet Take 1,000 mg by mouth every 8 (eight) hours as needed for mild pain.    . bisoprolol-hydrochlorothiazide (ZIAC) 5-6.25 MG tablet TAKE 1 TABLET BY MOUTH ONCE DAILY-AM    . buPROPion (WELLBUTRIN SR) 200 MG 12 hr tablet Take 200 mg by mouth 2 (two) times daily.    . diclofenac sodium (VOLTAREN) 1 % GEL APPLY 2 GRAMS TO AFFECTED AREA UP TO 4 TIMES A DAY AS NEEDED  0  . lidocaine-prilocaine (EMLA) cream Apply to affected area once 30 g 3  . loratadine (CLARITIN) 10 MG tablet Take 10 mg by mouth daily as needed for allergies.    Marland Kitchen ondansetron (ZOFRAN) 8 MG tablet Take 1 tablet (8 mg total) by mouth 2 (two) times daily as needed. 60 tablet 2  . prochlorperazine (COMPAZINE) 10 MG tablet Take 1 tablet (10 mg total) by mouth every 6 (six) hours as needed (Nausea or vomiting). 60 tablet 2  . traMADol (ULTRAM) 50 MG tablet Take 50 mg by mouth daily.     Marland Kitchen sulfamethoxazole-trimethoprim (BACTRIM DS,SEPTRA DS) 800-160 MG tablet Take 1 tablet by mouth 2 (two) times daily. (Patient not taking: Reported on 06/23/2017) 14 tablet 0   No current facility-administered medications for this visit.    Facility-Administered Medications Ordered in Other Visits  Medication Dose Route Frequency Provider Last Rate Last Dose  . heparin lock flush 100 unit/mL  500 Units Intracatheter Once PRN Lloyd Huger, MD      . PACLitaxel (TAXOL) 156 mg in dextrose 5 % 250 mL chemo infusion (</= '80mg'$ /m2)  80 mg/m2 (Order-Specific) Intravenous Once Lloyd Huger, MD 276 mL/hr at 06/23/17 1114 156 mg at 06/23/17 1114  . sodium chloride flush (NS) 0.9 % injection 10 mL  10 mL Intracatheter PRN Lloyd Huger, MD   10 mL at 06/23/17 0914    OBJECTIVE: Vitals:   06/23/17 0943  BP: (!) 150/90  Pulse: (!) 102  Resp: 20  Temp: 97.7 F (36.5 C)     Body mass index is 32.87 kg/m.    ECOG FS:0 - Asymptomatic  General: Well-developed,  well-nourished, no acute distress. Eyes: Pink conjunctiva, anicteric sclera. Breasts: Patient requested exam be deferred today. Lungs: Clear to auscultation bilaterally. Heart: Regular rate and rhythm. No rubs, murmurs, or gallops. Abdomen: Soft, nontender, nondistended. No organomegaly noted, normoactive bowel sounds. Musculoskeletal: No edema, cyanosis, or clubbing. Neuro: Alert, answering all questions appropriately. Cranial nerves grossly intact. Skin: No rashes or petechiae noted. Psych: Normal affect.   LAB RESULTS:  Lab Results  Component Value Date   NA 140 06/23/2017   K 3.5 06/23/2017   CL 105 06/23/2017   CO2 27 06/23/2017   GLUCOSE 90 06/23/2017   BUN 6 06/23/2017   CREATININE 0.59 06/23/2017   CALCIUM 8.6 (L) 06/23/2017   PROT 6.6 06/23/2017   ALBUMIN 3.6 06/23/2017   AST 19 06/23/2017   ALT 12 (L) 06/23/2017   ALKPHOS 119 06/23/2017   BILITOT 0.3 06/23/2017  GFRNONAA >60 06/23/2017   GFRAA >60 06/23/2017    Lab Results  Component Value Date   WBC 13.9 (H) 06/23/2017   NEUTROABS 10.5 (H) 06/23/2017   HGB 8.3 (L) 06/23/2017   HCT 25.3 (L) 06/23/2017   MCV 69.9 (L) 06/23/2017   PLT 264 06/23/2017     STUDIES: No results found.  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  PLAN:    1. 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 After lengthy discussion with the patient, she is highly interested in breast conservation and wants to minimize the amount of surgery.  Patient completed 4 cycles of neoadjuvant Adriamycin and Cytoxan on June 09, 2017. Once patient completes her chemotherapy, she will require lumpectomy followed by XRT as well as an aromatase inhibitor for 5 years. CT scan from April 13, 2017  did not reveal any metastatic disease.  Proceed with cycle 1 of 12 of weekly Taxol today.  Return to clinic in 1 week for consideration of cycle 2.  2. Right hand fracture: Continue monitoring treatment per orthopedics.  Proceed with injections for pain as needed.  Case discussed with Dr. Grandville Silos.   3. Neutropenia: Resolved. Secondary to chemotherapy. 4.  Pruritic skin: Recommended OTC topical ointments as needed. 5.  Anemia: Mild, monitor.   Patient expressed understanding and was in agreement with this plan. She also understands that She can call clinic at any time with any questions, concerns, or complaints.   Cancer Staging Primary cancer of upper outer quadrant of right female breast [facility]) Staging form: Breast, AJCC 8th Edition - Clinical stage from 03/27/2017: Stage IB (cT1c, cN1, cM0, G2, ER: Positive, PR: Positive, HER2: Negative) - Signed by Lloyd Huger, MD on 03/27/2017   Lloyd Huger, MD   06/23/2017 11:14 AM

## 2017-06-23 ENCOUNTER — Inpatient Hospital Stay: Payer: BLUE CROSS/BLUE SHIELD

## 2017-06-23 ENCOUNTER — Encounter: Payer: Self-pay | Admitting: Oncology

## 2017-06-23 ENCOUNTER — Inpatient Hospital Stay: Payer: BLUE CROSS/BLUE SHIELD | Attending: Oncology | Admitting: Oncology

## 2017-06-23 ENCOUNTER — Other Ambulatory Visit: Payer: Self-pay

## 2017-06-23 VITALS — BP 123/78 | HR 62 | Resp 20

## 2017-06-23 VITALS — BP 150/90 | HR 102 | Temp 97.7°F | Resp 20 | Wt 191.5 lb

## 2017-06-23 DIAGNOSIS — Z79899 Other long term (current) drug therapy: Secondary | ICD-10-CM | POA: Insufficient documentation

## 2017-06-23 DIAGNOSIS — Z170 Estrogen receptor positive status [ER+]: Secondary | ICD-10-CM | POA: Diagnosis not present

## 2017-06-23 DIAGNOSIS — Z5111 Encounter for antineoplastic chemotherapy: Secondary | ICD-10-CM | POA: Diagnosis not present

## 2017-06-23 DIAGNOSIS — C50411 Malignant neoplasm of upper-outer quadrant of right female breast: Secondary | ICD-10-CM | POA: Insufficient documentation

## 2017-06-23 DIAGNOSIS — F17210 Nicotine dependence, cigarettes, uncomplicated: Secondary | ICD-10-CM | POA: Diagnosis not present

## 2017-06-23 DIAGNOSIS — K219 Gastro-esophageal reflux disease without esophagitis: Secondary | ICD-10-CM | POA: Insufficient documentation

## 2017-06-23 DIAGNOSIS — I10 Essential (primary) hypertension: Secondary | ICD-10-CM | POA: Insufficient documentation

## 2017-06-23 DIAGNOSIS — D649 Anemia, unspecified: Secondary | ICD-10-CM | POA: Diagnosis not present

## 2017-06-23 LAB — COMPREHENSIVE METABOLIC PANEL
ALT: 12 U/L — AB (ref 14–54)
AST: 19 U/L (ref 15–41)
Albumin: 3.6 g/dL (ref 3.5–5.0)
Alkaline Phosphatase: 119 U/L (ref 38–126)
Anion gap: 8 (ref 5–15)
BUN: 6 mg/dL (ref 6–20)
CHLORIDE: 105 mmol/L (ref 101–111)
CO2: 27 mmol/L (ref 22–32)
Calcium: 8.6 mg/dL — ABNORMAL LOW (ref 8.9–10.3)
Creatinine, Ser: 0.59 mg/dL (ref 0.44–1.00)
Glucose, Bld: 90 mg/dL (ref 65–99)
POTASSIUM: 3.5 mmol/L (ref 3.5–5.1)
SODIUM: 140 mmol/L (ref 135–145)
Total Bilirubin: 0.3 mg/dL (ref 0.3–1.2)
Total Protein: 6.6 g/dL (ref 6.5–8.1)

## 2017-06-23 LAB — CBC WITH DIFFERENTIAL/PLATELET
Basophils Absolute: 0.1 10*3/uL (ref 0–0.1)
Basophils Relative: 1 %
EOS ABS: 0 10*3/uL (ref 0–0.7)
Eosinophils Relative: 0 %
HEMATOCRIT: 25.3 % — AB (ref 35.0–47.0)
HEMOGLOBIN: 8.3 g/dL — AB (ref 12.0–16.0)
LYMPHS ABS: 1.4 10*3/uL (ref 1.0–3.6)
LYMPHS PCT: 10 %
MCH: 22.9 pg — AB (ref 26.0–34.0)
MCHC: 32.7 g/dL (ref 32.0–36.0)
MCV: 69.9 fL — AB (ref 80.0–100.0)
MONOS PCT: 14 %
Monocytes Absolute: 1.9 10*3/uL — ABNORMAL HIGH (ref 0.2–0.9)
NEUTROS ABS: 10.5 10*3/uL — AB (ref 1.4–6.5)
NEUTROS PCT: 75 %
Platelets: 264 10*3/uL (ref 150–440)
RBC: 3.61 MIL/uL — AB (ref 3.80–5.20)
RDW: 14.7 % — ABNORMAL HIGH (ref 11.5–14.5)
WBC: 13.9 10*3/uL — AB (ref 3.6–11.0)

## 2017-06-23 MED ORDER — DIPHENHYDRAMINE HCL 50 MG/ML IJ SOLN
25.0000 mg | Freq: Once | INTRAMUSCULAR | Status: AC
Start: 2017-06-23 — End: 2017-06-23
  Administered 2017-06-23: 25 mg via INTRAVENOUS
  Filled 2017-06-23: qty 1

## 2017-06-23 MED ORDER — HEPARIN SOD (PORK) LOCK FLUSH 100 UNIT/ML IV SOLN
500.0000 [IU] | Freq: Once | INTRAVENOUS | Status: AC | PRN
  Administered 2017-06-23: 500 [IU]
  Filled 2017-06-23: qty 5

## 2017-06-23 MED ORDER — DEXAMETHASONE SODIUM PHOSPHATE 100 MG/10ML IJ SOLN: 10.0000 mg | Freq: Once | INTRAMUSCULAR | Status: DC

## 2017-06-23 MED ORDER — SODIUM CHLORIDE 0.9 % IV SOLN
Freq: Once | INTRAVENOUS | Status: AC
Start: 2017-06-23 — End: 2017-06-23
  Administered 2017-06-23: 11:00:00 via INTRAVENOUS
  Filled 2017-06-23: qty 1000

## 2017-06-23 MED ORDER — PACLITAXEL CHEMO INJECTION 300 MG/50ML
80.0000 mg/m2 | Freq: Once | INTRAVENOUS | Status: AC
  Administered 2017-06-23: 156 mg via INTRAVENOUS
  Filled 2017-06-23: qty 26

## 2017-06-23 MED ORDER — FAMOTIDINE IN NACL 20-0.9 MG/50ML-% IV SOLN
20.0000 mg | Freq: Once | INTRAVENOUS | Status: AC
  Administered 2017-06-23: 20 mg via INTRAVENOUS
  Filled 2017-06-23: qty 50

## 2017-06-23 MED ORDER — DEXAMETHASONE SODIUM PHOSPHATE 10 MG/ML IJ SOLN
10.0000 mg | Freq: Once | INTRAMUSCULAR | Status: AC
Start: 2017-06-23 — End: 2017-06-23
  Administered 2017-06-23: 10 mg via INTRAVENOUS
  Filled 2017-06-23: qty 1

## 2017-06-23 MED ORDER — SODIUM CHLORIDE 0.9% FLUSH
10.0000 mL | INTRAVENOUS | Status: DC | PRN
  Administered 2017-06-23: 10 mL
  Filled 2017-06-23: qty 10

## 2017-06-23 NOTE — Progress Notes (Signed)
Patient here today for follow up and treatment consideration regarding breast cancer. Patient reports dry skin to hands and leg, skin on leg is itching. Patient also reports nail changes.

## 2017-06-30 ENCOUNTER — Inpatient Hospital Stay: Payer: BLUE CROSS/BLUE SHIELD

## 2017-06-30 ENCOUNTER — Inpatient Hospital Stay: Payer: BLUE CROSS/BLUE SHIELD | Admitting: Oncology

## 2017-06-30 VITALS — BP 124/82 | HR 105 | Temp 98.0°F | Resp 18 | Wt 194.1 lb

## 2017-06-30 VITALS — BP 141/79 | HR 103

## 2017-06-30 DIAGNOSIS — Z79899 Other long term (current) drug therapy: Secondary | ICD-10-CM | POA: Diagnosis not present

## 2017-06-30 DIAGNOSIS — Z170 Estrogen receptor positive status [ER+]: Secondary | ICD-10-CM | POA: Diagnosis not present

## 2017-06-30 DIAGNOSIS — C50411 Malignant neoplasm of upper-outer quadrant of right female breast: Secondary | ICD-10-CM

## 2017-06-30 DIAGNOSIS — F17210 Nicotine dependence, cigarettes, uncomplicated: Secondary | ICD-10-CM

## 2017-06-30 DIAGNOSIS — D649 Anemia, unspecified: Secondary | ICD-10-CM | POA: Diagnosis not present

## 2017-06-30 LAB — COMPREHENSIVE METABOLIC PANEL
ALBUMIN: 3.5 g/dL (ref 3.5–5.0)
ALK PHOS: 88 U/L (ref 38–126)
ALT: 13 U/L — ABNORMAL LOW (ref 14–54)
AST: 18 U/L (ref 15–41)
Anion gap: 8 (ref 5–15)
BILIRUBIN TOTAL: 0.3 mg/dL (ref 0.3–1.2)
BUN: 7 mg/dL (ref 6–20)
CALCIUM: 8.9 mg/dL (ref 8.9–10.3)
CO2: 25 mmol/L (ref 22–32)
CREATININE: 0.64 mg/dL (ref 0.44–1.00)
Chloride: 107 mmol/L (ref 101–111)
GFR calc Af Amer: >60 mL/min (ref >60)
GLUCOSE: 113 mg/dL — AB (ref 65–99)
Potassium: 3.4 mmol/L — ABNORMAL LOW (ref 3.5–5.1)
Sodium: 140 mmol/L (ref 135–145)
TOTAL PROTEIN: 6.4 g/dL — AB (ref 6.5–8.1)

## 2017-06-30 LAB — CBC WITH DIFFERENTIAL/PLATELET
BASOS ABS: 0.2 10*3/uL — AB (ref 0–0.1)
BASOS PCT: 2 %
EOS ABS: 0 10*3/uL (ref 0–0.7)
EOS PCT: 1 %
HCT: 26 % — ABNORMAL LOW (ref 35.0–47.0)
Hemoglobin: 8.7 g/dL — ABNORMAL LOW (ref 12.0–16.0)
Lymphocytes Relative: 14 %
Lymphs Abs: 1.1 10*3/uL (ref 1.0–3.6)
MCH: 23.5 pg — ABNORMAL LOW (ref 26.0–34.0)
MCHC: 33.2 g/dL (ref 32.0–36.0)
MCV: 70.7 fL — AB (ref 80.0–100.0)
MONO ABS: 0.6 10*3/uL (ref 0.2–0.9)
Monocytes Relative: 8 %
Neutro Abs: 6 10*3/uL (ref 1.4–6.5)
Neutrophils Relative %: 75 %
PLATELETS: 361 10*3/uL (ref 150–440)
RBC: 3.68 MIL/uL — ABNORMAL LOW (ref 3.80–5.20)
RDW: 16.6 % — AB (ref 11.5–14.5)
WBC: 8 10*3/uL (ref 3.6–11.0)

## 2017-06-30 MED ORDER — SODIUM CHLORIDE 0.9 % IV SOLN: 10.0000 mg | Freq: Once | INTRAVENOUS | Status: DC

## 2017-06-30 MED ORDER — DEXAMETHASONE SODIUM PHOSPHATE 10 MG/ML IJ SOLN
10.0000 mg | Freq: Once | INTRAMUSCULAR | Status: AC
  Administered 2017-06-30: 10 mg via INTRAVENOUS
  Filled 2017-06-30: qty 1

## 2017-06-30 MED ORDER — DIPHENHYDRAMINE HCL 50 MG/ML IJ SOLN
25.0000 mg | Freq: Once | INTRAMUSCULAR | Status: AC
  Administered 2017-06-30: 25 mg via INTRAVENOUS
  Filled 2017-06-30: qty 1

## 2017-06-30 MED ORDER — SODIUM CHLORIDE 0.9 % IV SOLN
Freq: Once | INTRAVENOUS | Status: AC
  Administered 2017-06-30: 12:00:00 via INTRAVENOUS
  Filled 2017-06-30: qty 1000

## 2017-06-30 MED ORDER — HEPARIN SOD (PORK) LOCK FLUSH 100 UNIT/ML IV SOLN
500.0000 [IU] | Freq: Once | INTRAVENOUS | Status: AC | PRN
  Administered 2017-06-30: 500 [IU]

## 2017-06-30 MED ORDER — SODIUM CHLORIDE 0.9 % IV SOLN
80.0000 mg/m2 | Freq: Once | INTRAVENOUS | Status: AC
  Administered 2017-06-30: 156 mg via INTRAVENOUS
  Filled 2017-06-30: qty 26

## 2017-06-30 MED ORDER — FAMOTIDINE IN NACL 20-0.9 MG/50ML-% IV SOLN
20.0000 mg | Freq: Once | INTRAVENOUS | Status: AC
  Administered 2017-06-30: 20 mg via INTRAVENOUS
  Filled 2017-06-30: qty 50

## 2017-06-30 NOTE — Progress Notes (Signed)
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  INTERVAL HISTORY: Patient returns to clinic today for evaluation and consideration of cycle 2 of 12 of weekly Taxol.  She tolerated her first treatment well without significant side effects.  She has no neurologic complaints. She continues to have right hand pain secondary to fracture. She has a good appetite and denies weight loss. She has no chest pain or shortness of breath. She denies any nausea, vomiting, constipation, or diarrhea. She has no urinary complaints. Patient offers no further specific complaints today.  REVIEW OF SYSTEMS:   Review of Systems  Constitutional: Negative.  Negative for fever, malaise/fatigue and weight loss.  Respiratory: Negative.  Negative for cough and shortness of breath.   Cardiovascular: Negative.  Negative for chest pain and leg swelling.  Gastrointestinal: Negative.  Negative for abdominal pain.  Genitourinary: Negative.   Musculoskeletal: Positive for joint pain.  Skin: Negative.  Negative for itching and rash.  Neurological: Negative.  Negative for sensory change and weakness.  Psychiatric/Behavioral: The patient is nervous/anxious.     As per HPI. Otherwise, a complete review of systems is negative.  PAST MEDICAL HISTORY: Past Medical History:  Diagnosis Date  . Arthritis   . Cancer ([place])   . GERD (gastroesophageal reflux disease)    NO MEDS  . Heart murmur   . Hypertension   . Migraines   . Thumb fracture    LEFT-WEARING BRACE    PAST SURGICAL HISTORY: Past Surgical History:  Procedure Laterality Date  . BREAST BIOPSY Right 03/19/2017   US guided biopsy of right breast and  right lymph node  . CESAREAN SECTION    . FRACTURE SURGERY     left hand  . PILONIDAL CYST EXCISION  1980'S  . PORT-A-CATH REMOVAL N/A 04/27/2017   Procedure: REMOVAL OF PORT A CATH & insertion PORT A CATH LEFT INTERNAL JUGULAR;  Surgeon: Herbert Pun, MD;  Location: [facility] ORS;  Service: General;  Laterality: N/A;  . PORTACATH PLACEMENT N/A 04/15/2017   Procedure: INSERTION PORT-A-CATH;  Surgeon: Herbert Pun, MD;  Location: [facility] ORS;  Service: General;  Laterality: N/A;    FAMILY HISTORY: Family History  Problem Relation Age of Onset  . Breast cancer Maternal Grandmother   . Hypertension Maternal Grandmother   . Angina Mother   . Lung cancer Father   . Diabetes Father   . Prostate cancer Maternal Uncle   . Dementia Maternal Grandfather   . Congestive Heart Failure Maternal Grandfather   . Heart disease Paternal Grandmother     ADVANCED DIRECTIVES (Y/N):  N  HEALTH MAINTENANCE: Social History   Tobacco Use  . Smoking status: Current Every Day Smoker    Packs/day: 2.00    Years: 18.00    Pack years: 36.00    Types: Cigarettes  . Smokeless tobacco: Never Used  . Tobacco comment: PT IS NOW DOWN TO 1/2 PACK SINCE CANCER DIAGNOSIS   Substance Use Topics  . Alcohol use: No  . Drug use: No     Colonoscopy:  PAP:  Bone density:  Lipid panel:  Allergies  Allergen Reactions  . Hydrocodone-Acetaminophen Itching and Rash  . Penicillins Swelling, Other (See Comments) and Rash    Throat swelling  Has patient had a PCN reaction causing immediate rash, facial/tongue/throat swelling, SOB or lightheadedness with hypotension: Yes Has patient had a PCN reaction causing severe rash involving mucus membranes or skin necrosis: No Has patient had a PCN reaction that required hospitalization: No Has patient had a PCN reaction occurring within the last 10 years: No If all of the above answers are "NO", then may proceed with Cephalosporin use.     Current Outpatient  Medications  Medication Sig Dispense Refill  . acetaminophen (TYLENOL) 500 MG tablet Take 1,000 mg by mouth every 8 (eight) hours as needed for mild pain.    . bisoprolol-hydrochlorothiazide (ZIAC) 5-6.25 MG tablet TAKE 1 TABLET BY MOUTH ONCE DAILY-AM    . buPROPion (WELLBUTRIN SR) 200 MG 12 hr tablet Take 200 mg by mouth 2 (two) times daily.    . diclofenac sodium (VOLTAREN) 1 % GEL APPLY 2 GRAMS TO AFFECTED AREA UP TO 4 TIMES A DAY AS NEEDED  0  . lidocaine-prilocaine (EMLA) cream Apply to affected area once 30 g 3  . loratadine (CLARITIN) 10 MG tablet Take 10 mg by mouth daily as needed for allergies.    Marland Kitchen ondansetron (ZOFRAN) 8 MG tablet Take 1 tablet (8 mg total) by mouth 2 (two) times daily as needed. 60 tablet 2  . prochlorperazine (COMPAZINE) 10 MG tablet Take 1 tablet (10 mg total) by mouth every 6 (six) hours as needed (Nausea or vomiting). 60 tablet 2  . sulfamethoxazole-trimethoprim (BACTRIM DS,SEPTRA DS) 800-160 MG tablet Take 1 tablet by mouth 2 (two) times daily. 14 tablet 0  . traMADol (ULTRAM) 50 MG tablet Take 50 mg by mouth daily.      No current facility-administered medications for this visit.     OBJECTIVE: Vitals:   06/30/17 1103  BP: 124/82  Pulse: (!) 105  Resp: 18  Temp: 98 F (36.7 C)     Body mass index is 33.32 kg/m.    ECOG FS:0 - Asymptomatic  General: Well-developed, well-nourished, no acute distress. Eyes: Pink conjunctiva, anicteric sclera. Breasts: Patient requested exam be deferred today. Lungs: Clear to auscultation bilaterally. Heart: Regular rate and rhythm. No rubs, murmurs, or gallops. Abdomen: Soft, nontender, nondistended. No organomegaly noted, normoactive bowel sounds. Musculoskeletal: No edema, cyanosis, or clubbing. Neuro: Alert, answering all questions appropriately. Cranial nerves grossly intact. Skin: No rashes or petechiae noted. Psych: Normal affect.   LAB RESULTS:  Lab Results  Component Value Date   NA 140 06/23/2017    K 3.5 06/23/2017   CL 105 06/23/2017   CO2 27 06/23/2017   GLUCOSE 90 06/23/2017   BUN 6 06/23/2017   CREATININE 0.59 06/23/2017   CALCIUM 8.6 (L) 06/23/2017   PROT 6.6 06/23/2017   ALBUMIN 3.6 06/23/2017   AST 19 06/23/2017   ALT 12 (L) 06/23/2017   ALKPHOS 119 06/23/2017   BILITOT 0.3 06/23/2017   GFRNONAA >60 06/23/2017   GFRAA >60 06/23/2017    Lab Results  Component Value Date   WBC 8.0 06/30/2017   NEUTROABS 6.0 06/30/2017   HGB 8.7 (L) 06/30/2017   HCT 26.0 (L) 06/30/2017   MCV 70.7 (L) 06/30/2017   PLT 361 06/30/2017     STUDIES: No results found.  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  PLAN:    1. 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 After lengthy discussion with the patient, she is highly interested in breast conservation and wants to minimize the amount of surgery.  Patient completed 4 cycles of neoadjuvant Adriamycin and Cytoxan on June 09, 2017. Once patient completes her chemotherapy, she will require lumpectomy followed by XRT as well as an aromatase inhibitor for 5 years. CT scan from April 13, 2017 did not reveal any metastatic disease.  Proceed with cycle 2 of 12 of weekly Taxol today.  Return to clinic in 1 week for laboratory work and consideration of cycle 3 and then in 2 weeks for further evaluation and consideration of cycle 4.  2. Right hand fracture: Continue monitoring treatment per orthopedics.  Proceed with injections for pain as needed.  Case discussed with Dr. Grandville Silos.   3. Neutropenia: Resolved. Secondary to chemotherapy. 4.  Pruritic skin: Recommended OTC topical ointments as needed. 5.  Anemia: Mild, monitor. 6.  Tachycardia: Proceed with treatment as above.   Patient  expressed understanding and was in agreement with this plan. She also understands that She can call clinic at any time with any questions, concerns, or complaints.   Cancer Staging Primary cancer of upper outer quadrant of right female breast [facility]) Staging form: Breast, AJCC 8th Edition - Clinical stage from 03/27/2017: Stage IB (cT1c, cN1, cM0, G2, ER: Positive, PR: Positive, HER2: Negative) - Signed by Lloyd Huger, MD on 03/27/2017   Lloyd Huger, MD   06/30/2017 11:20 AM

## 2017-06-30 NOTE — Progress Notes (Signed)
Reviewed parameters with Dr. Grayland Ormond, patient will receive treatment today.

## 2017-07-07 ENCOUNTER — Inpatient Hospital Stay: Payer: BLUE CROSS/BLUE SHIELD

## 2017-07-07 ENCOUNTER — Encounter: Payer: Self-pay | Admitting: *Deleted

## 2017-07-07 VITALS — BP 120/79 | HR 65 | Temp 97.9°F | Resp 20 | Wt 187.6 lb

## 2017-07-07 DIAGNOSIS — C50411 Malignant neoplasm of upper-outer quadrant of right female breast: Secondary | ICD-10-CM

## 2017-07-07 LAB — CBC WITH DIFFERENTIAL/PLATELET
BASOS ABS: 0.1 10*3/uL (ref 0–0.1)
Basophils Relative: 2 %
Eosinophils Absolute: 0.3 10*3/uL (ref 0–0.7)
Eosinophils Relative: 5 %
HEMATOCRIT: 28 % — AB (ref 35.0–47.0)
Hemoglobin: 9.1 g/dL — ABNORMAL LOW (ref 12.0–16.0)
LYMPHS PCT: 15 %
Lymphs Abs: 0.9 10*3/uL — ABNORMAL LOW (ref 1.0–3.6)
MCH: 23.8 pg — ABNORMAL LOW (ref 26.0–34.0)
MCHC: 32.5 g/dL (ref 32.0–36.0)
MCV: 73.1 fL — AB (ref 80.0–100.0)
MONO ABS: 0.5 10*3/uL (ref 0.2–0.9)
Monocytes Relative: 9 %
NEUTROS ABS: 3.8 10*3/uL (ref 1.4–6.5)
Neutrophils Relative %: 69 %
Platelets: 306 10*3/uL (ref 150–440)
RBC: 3.83 MIL/uL (ref 3.80–5.20)
RDW: 23.4 % — AB (ref 11.5–14.5)
WBC: 5.6 10*3/uL (ref 3.6–11.0)

## 2017-07-07 LAB — COMPREHENSIVE METABOLIC PANEL
ALBUMIN: 3.7 g/dL (ref 3.5–5.0)
ALT: 15 U/L (ref 14–54)
ANION GAP: 8 (ref 5–15)
AST: 16 U/L (ref 15–41)
Alkaline Phosphatase: 89 U/L (ref 38–126)
BUN: 10 mg/dL (ref 6–20)
CO2: 25 mmol/L (ref 22–32)
Calcium: 8.9 mg/dL (ref 8.9–10.3)
Chloride: 106 mmol/L (ref 101–111)
Creatinine, Ser: 0.69 mg/dL (ref 0.44–1.00)
GFR calc Af Amer: >60 mL/min (ref >60)
GFR calc non Af Amer: >60 mL/min (ref >60)
GLUCOSE: 93 mg/dL (ref 65–99)
POTASSIUM: 3.4 mmol/L — AB (ref 3.5–5.1)
Sodium: 139 mmol/L (ref 135–145)
TOTAL PROTEIN: 6.6 g/dL (ref 6.5–8.1)
Total Bilirubin: 0.6 mg/dL (ref 0.3–1.2)

## 2017-07-07 MED ORDER — FAMOTIDINE IN NACL 20-0.9 MG/50ML-% IV SOLN
20.0000 mg | Freq: Once | INTRAVENOUS | Status: AC
  Administered 2017-07-07: 20 mg via INTRAVENOUS
  Filled 2017-07-07: qty 50

## 2017-07-07 MED ORDER — DEXAMETHASONE SODIUM PHOSPHATE 10 MG/ML IJ SOLN
10.0000 mg | Freq: Once | INTRAMUSCULAR | Status: AC
  Administered 2017-07-07: 10 mg via INTRAVENOUS
  Filled 2017-07-07: qty 1

## 2017-07-07 MED ORDER — SODIUM CHLORIDE 0.9 % IV SOLN
80.0000 mg/m2 | Freq: Once | INTRAVENOUS | Status: AC
  Administered 2017-07-07: 156 mg via INTRAVENOUS
  Filled 2017-07-07: qty 26

## 2017-07-07 MED ORDER — SODIUM CHLORIDE 0.9 % IV SOLN
Freq: Once | INTRAVENOUS | Status: AC
  Administered 2017-07-07: 12:00:00 via INTRAVENOUS
  Filled 2017-07-07: qty 1000

## 2017-07-07 MED ORDER — HEPARIN SOD (PORK) LOCK FLUSH 100 UNIT/ML IV SOLN
500.0000 [IU] | Freq: Once | INTRAVENOUS | Status: AC
  Administered 2017-07-07: 500 [IU] via INTRAVENOUS
  Filled 2017-07-07: qty 5

## 2017-07-07 MED ORDER — SODIUM CHLORIDE 0.9 % IV SOLN: 10.0000 mg | Freq: Once | INTRAVENOUS | Status: DC

## 2017-07-07 MED ORDER — DIPHENHYDRAMINE HCL 50 MG/ML IJ SOLN
25.0000 mg | Freq: Once | INTRAMUSCULAR | Status: AC
  Administered 2017-07-07: 25 mg via INTRAVENOUS
  Filled 2017-07-07: qty 1

## 2017-07-07 MED ORDER — SODIUM CHLORIDE 0.9% FLUSH
10.0000 mL | INTRAVENOUS | Status: DC | PRN
  Administered 2017-07-07: 10 mL via INTRAVENOUS
  Filled 2017-07-07: qty 10

## 2017-07-07 NOTE — Progress Notes (Signed)
  Oncology Nurse Navigator Documentation  Navigator Location: [facility] (07/07/17 1500)   )Navigator Encounter Type: Clinic/MDC (07/07/17 1500)                       Treatment Phase: Active Tx (07/07/17 1500) Barriers/Navigation Needs: Financial (07/07/17 1500) Education: Other (07/07/17 1500)                        Time Spent with Patient: 45 (07/07/17 1500)    Met with patient and her son today.  She is struggling financially.  We will pay her electric and water bills through our [employer].  Gift card called into [employer].  Number given to [facility] for counseling, and web address for the [email or web address].

## 2017-07-07 NOTE — Progress Notes (Signed)
Patient states, "My feet have a burning sensation occasionally. It isn't all of the time and it isn't really bad when it happens.The burning is not interfering with my ability to function at this time." MD, Dr. Grayland Ormond, notified via telephone and aware. Patient educated to notify staff if these symptoms worsen in the future. Per MD order: Proceed with scheduled Taxol treatment today.

## 2017-07-15 ENCOUNTER — Ambulatory Visit: Payer: BLUE CROSS/BLUE SHIELD

## 2017-07-15 ENCOUNTER — Ambulatory Visit: Payer: BLUE CROSS/BLUE SHIELD | Admitting: Oncology

## 2017-07-15 ENCOUNTER — Other Ambulatory Visit: Payer: BLUE CROSS/BLUE SHIELD

## 2017-07-15 ENCOUNTER — Inpatient Hospital Stay: Payer: BLUE CROSS/BLUE SHIELD

## 2017-07-15 VITALS — BP 130/83 | HR 89 | Temp 96.6°F | Resp 20 | Wt 191.8 lb

## 2017-07-15 VITALS — BP 128/78 | HR 91 | Temp 99.0°F | Resp 18

## 2017-07-15 DIAGNOSIS — C50411 Malignant neoplasm of upper-outer quadrant of right female breast: Secondary | ICD-10-CM

## 2017-07-15 LAB — CBC WITH DIFFERENTIAL/PLATELET
BASOS PCT: 1 %
Basophils Absolute: 0.1 10*3/uL (ref 0–0.1)
EOS ABS: 0.4 10*3/uL (ref 0–0.7)
Eosinophils Relative: 6 %
HCT: 27.2 % — ABNORMAL LOW (ref 35.0–47.0)
Hemoglobin: 8.8 g/dL — ABNORMAL LOW (ref 12.0–16.0)
Lymphocytes Relative: 17 %
Lymphs Abs: 1.2 10*3/uL (ref 1.0–3.6)
MCH: 24.3 pg — ABNORMAL LOW (ref 26.0–34.0)
MCHC: 32.4 g/dL (ref 32.0–36.0)
MCV: 75.1 fL — ABNORMAL LOW (ref 80.0–100.0)
MONO ABS: 0.6 10*3/uL (ref 0.2–0.9)
MONOS PCT: 8 %
Neutro Abs: 4.8 10*3/uL (ref 1.4–6.5)
Neutrophils Relative %: 68 %
Platelets: 232 10*3/uL (ref 150–440)
RBC: 3.62 MIL/uL — ABNORMAL LOW (ref 3.80–5.20)
RDW: 25.1 % — AB (ref 11.5–14.5)
WBC: 7.1 10*3/uL (ref 3.6–11.0)

## 2017-07-15 LAB — COMPREHENSIVE METABOLIC PANEL
ALK PHOS: 91 U/L (ref 38–126)
ALT: 13 U/L — AB (ref 14–54)
AST: 21 U/L (ref 15–41)
Albumin: 3.6 g/dL (ref 3.5–5.0)
Anion gap: 8 (ref 5–15)
BUN: 8 mg/dL (ref 6–20)
CALCIUM: 8.6 mg/dL — AB (ref 8.9–10.3)
CO2: 24 mmol/L (ref 22–32)
CREATININE: 0.68 mg/dL (ref 0.44–1.00)
Chloride: 102 mmol/L (ref 101–111)
GFR calc non Af Amer: >60 mL/min (ref >60)
GLUCOSE: 113 mg/dL — AB (ref 65–99)
Potassium: 3.4 mmol/L — ABNORMAL LOW (ref 3.5–5.1)
SODIUM: 134 mmol/L — AB (ref 135–145)
Total Bilirubin: 0.2 mg/dL — ABNORMAL LOW (ref 0.3–1.2)
Total Protein: 6.6 g/dL (ref 6.5–8.1)

## 2017-07-15 MED ORDER — DIPHENHYDRAMINE HCL 50 MG/ML IJ SOLN
25.0000 mg | Freq: Once | INTRAMUSCULAR | Status: AC
  Administered 2017-07-15: 25 mg via INTRAVENOUS

## 2017-07-15 MED ORDER — HEPARIN SOD (PORK) LOCK FLUSH 100 UNIT/ML IV SOLN
INTRAVENOUS | Status: AC
  Filled 2017-07-15: qty 5

## 2017-07-15 MED ORDER — DEXAMETHASONE SODIUM PHOSPHATE 10 MG/ML IJ SOLN
10.0000 mg | Freq: Once | INTRAMUSCULAR | Status: AC
  Administered 2017-07-15: 10 mg via INTRAVENOUS

## 2017-07-15 MED ORDER — FAMOTIDINE IN NACL 20-0.9 MG/50ML-% IV SOLN
20.0000 mg | Freq: Once | INTRAVENOUS | Status: AC
  Administered 2017-07-15: 20 mg via INTRAVENOUS

## 2017-07-15 MED ORDER — FAMOTIDINE IN NACL 20-0.9 MG/50ML-% IV SOLN
INTRAVENOUS | Status: AC
  Filled 2017-07-15: qty 50

## 2017-07-15 MED ORDER — SODIUM CHLORIDE 0.9% FLUSH
10.0000 mL | INTRAVENOUS | Status: DC | PRN
  Administered 2017-07-15: 10 mL
  Filled 2017-07-15: qty 10

## 2017-07-15 MED ORDER — DEXAMETHASONE SODIUM PHOSPHATE 10 MG/ML IJ SOLN
INTRAMUSCULAR | Status: AC
  Filled 2017-07-15: qty 1

## 2017-07-15 MED ORDER — SODIUM CHLORIDE 0.9 % IV SOLN
80.0000 mg/m2 | Freq: Once | INTRAVENOUS | Status: AC
  Administered 2017-07-15: 156 mg via INTRAVENOUS
  Filled 2017-07-15 (×2): qty 26

## 2017-07-15 MED ORDER — DIPHENHYDRAMINE HCL 50 MG/ML IJ SOLN
INTRAMUSCULAR | Status: AC
  Filled 2017-07-15: qty 1

## 2017-07-15 MED ORDER — SODIUM CHLORIDE 0.9 % IV SOLN
Freq: Once | INTRAVENOUS | Status: AC
  Administered 2017-07-15: 10:00:00 via INTRAVENOUS
  Filled 2017-07-15: qty 1000

## 2017-07-15 MED ORDER — HEPARIN SOD (PORK) LOCK FLUSH 100 UNIT/ML IV SOLN
500.0000 [IU] | Freq: Once | INTRAVENOUS | Status: AC | PRN
  Administered 2017-07-15: 500 [IU]

## 2017-07-16 ENCOUNTER — Encounter (HOSPITAL_COMMUNITY): Payer: Self-pay

## 2017-07-20 ENCOUNTER — Other Ambulatory Visit: Payer: Self-pay | Admitting: Oncology

## 2017-07-20 NOTE — Progress Notes (Signed)
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  INTERVAL HISTORY: Patient returns to clinic today for evaluation and consideration of cycle 5 of 12 of weekly Taxol.  She continues to tolerate her treatments well without significant side effects. She has no peripheral neuropathy or other neurologic complaints. She continues to have right hand pain secondary to fracture. She has a good appetite and denies weight loss. She has no chest pain or shortness of breath. She denies any nausea, vomiting, constipation, or diarrhea. She has no urinary complaints. Patient offers no further specific complaints today.  REVIEW OF SYSTEMS:   Review of Systems  Constitutional: Negative.  Negative for fever, malaise/fatigue and weight loss.  Respiratory: Negative.  Negative for cough and shortness of breath.   Cardiovascular: Negative.  Negative for chest pain and leg swelling.  Gastrointestinal: Negative.  Negative for abdominal pain.  Genitourinary: Negative.   Musculoskeletal: Positive for joint pain.  Skin: Negative.  Negative for itching and rash.  Neurological: Negative.  Negative for sensory change and weakness.  Psychiatric/Behavioral: The patient is nervous/anxious.     As per HPI. Otherwise, a complete review of systems is negative.  PAST MEDICAL HISTORY: Past Medical History:  Diagnosis Date  . Arthritis   . Cancer ([place])   . GERD (gastroesophageal reflux disease)    NO MEDS  . Heart murmur   . Hypertension   . Migraines   . Thumb fracture    LEFT-WEARING BRACE    PAST SURGICAL HISTORY: Past Surgical History:  Procedure Laterality Date  . BREAST BIOPSY Right 03/19/2017   US  guided biopsy of right breast and right lymph node  . CESAREAN SECTION    . FRACTURE SURGERY     left hand  . PILONIDAL CYST EXCISION  1980'S  . PORT-A-CATH REMOVAL N/A 04/27/2017   Procedure: REMOVAL OF PORT A CATH & insertion PORT A CATH LEFT INTERNAL JUGULAR;  Surgeon: Herbert Pun, MD;  Location: [facility] ORS;  Service: General;  Laterality: N/A;  . PORTACATH PLACEMENT N/A 04/15/2017   Procedure: INSERTION PORT-A-CATH;  Surgeon: Herbert Pun, MD;  Location: [facility] ORS;  Service: General;  Laterality: N/A;    FAMILY HISTORY: Family History  Problem Relation Age of Onset  . Breast cancer Maternal Grandmother   . Hypertension Maternal Grandmother   . Angina Mother   . Lung cancer Father   . Diabetes Father   . Prostate cancer Maternal Uncle   . Dementia Maternal Grandfather   . Congestive Heart Failure Maternal Grandfather   . Heart disease Paternal Grandmother     ADVANCED DIRECTIVES (Y/N):  N  HEALTH MAINTENANCE: Social History   Tobacco Use  . Smoking status: Current Every Day Smoker    Packs/day: 2.00    Years: 18.00    Pack years: 36.00    Types: Cigarettes  . Smokeless tobacco: Never Used  . Tobacco comment: PT IS NOW DOWN TO 1/2 PACK SINCE CANCER DIAGNOSIS   Substance Use Topics  . Alcohol use: No  . Drug use: No     Colonoscopy:  PAP:  Bone density:  Lipid panel:  Allergies  Allergen Reactions  . Hydrocodone-Acetaminophen Itching and Rash  . Penicillins Swelling, Other (See Comments) and Rash  Throat swelling Has patient had a PCN reaction causing immediate rash, facial/tongue/throat swelling, SOB or lightheadedness with hypotension: Yes Has patient had a PCN reaction causing severe rash involving mucus membranes or skin necrosis: No Has patient had a PCN reaction that required hospitalization: No Has patient had a PCN reaction occurring within the last 10 years: No If all of the above answers are "NO", then may proceed with Cephalosporin  use.     Current Outpatient Medications  Medication Sig Dispense Refill  . acetaminophen (TYLENOL) 500 MG tablet Take 1,000 mg by mouth every 8 (eight) hours as needed for mild pain.    . bisoprolol-hydrochlorothiazide (ZIAC) 5-6.25 MG tablet TAKE 1 TABLET BY MOUTH ONCE DAILY-AM    . buPROPion (WELLBUTRIN SR) 200 MG 12 hr tablet Take 200 mg by mouth 2 (two) times daily.    . diclofenac sodium (VOLTAREN) 1 % GEL APPLY 2 GRAMS TO AFFECTED AREA UP TO 4 TIMES A DAY AS NEEDED  0  . lidocaine-prilocaine (EMLA) cream Apply to affected area once 30 g 3  . loratadine (CLARITIN) 10 MG tablet Take 10 mg by mouth daily as needed for allergies.    Marland Kitchen ondansetron (ZOFRAN) 8 MG tablet Take 1 tablet (8 mg total) by mouth 2 (two) times daily as needed. 60 tablet 2  . prochlorperazine (COMPAZINE) 10 MG tablet Take 1 tablet (10 mg total) by mouth every 6 (six) hours as needed (Nausea or vomiting). 60 tablet 2  . sulfamethoxazole-trimethoprim (BACTRIM DS,SEPTRA DS) 800-160 MG tablet Take 1 tablet by mouth 2 (two) times daily. 14 tablet 0  . traMADol (ULTRAM) 50 MG tablet Take 50 mg by mouth daily.      No current facility-administered medications for this visit.    Facility-Administered Medications Ordered in Other Visits  Medication Dose Route Frequency Provider Last Rate Last Dose  . 0.9 %  sodium chloride infusion   Intravenous Once Lloyd Huger, MD      . dexamethasone (DECADRON) injection 10 mg  10 mg Intravenous Once Lloyd Huger, MD      . diphenhydrAMINE (BENADRYL) injection 25 mg  25 mg Intravenous Once Lloyd Huger, MD      . famotidine (PEPCID) IVPB 20 mg premix  20 mg Intravenous Once Lloyd Huger, MD      . heparin lock flush 100 unit/mL  500 Units Intravenous Once Lloyd Huger, MD      . PACLitaxel (TAXOL) 156 mg in sodium chloride 0.9 % 250 mL chemo infusion (</= '80mg'$ /m2)  80 mg/m2 (Order-Specific) Intravenous Once Lloyd Huger, MD      . sodium chloride  flush (NS) 0.9 % injection 10 mL  10 mL Intracatheter PRN Lloyd Huger, MD   10 mL at 07/15/17 0855  . sodium chloride flush (NS) 0.9 % injection 10 mL  10 mL Intravenous PRN Lloyd Huger, MD   10 mL at 07/22/17 0832    OBJECTIVE: Vitals:   07/22/17 0955  BP: (!) 153/88  Pulse: 93  Resp: 20  Temp: 98.3 F (36.8 C)     Body mass index is 32.39 kg/m.    ECOG FS:0 - Asymptomatic  General: Well-developed, well-nourished, no acute distress. Eyes: Pink conjunctiva, anicteric sclera. Breasts: Patient requested exam be deferred today. Lungs: Clear to auscultation bilaterally. Heart: Regular rate and rhythm. No rubs, murmurs, or gallops. Abdomen: Soft, nontender, nondistended. No organomegaly noted, normoactive bowel sounds. Musculoskeletal: No edema, cyanosis, or clubbing. Neuro: Alert, answering all  questions appropriately. Cranial nerves grossly intact. Skin: No rashes or petechiae noted. Psych: Normal affect.   LAB RESULTS:  Lab Results  Component Value Date   NA 139 07/22/2017   K 3.5 07/22/2017   CL 104 07/22/2017   CO2 26 07/22/2017   GLUCOSE 98 07/22/2017   BUN 10 07/22/2017   CREATININE 0.78 07/22/2017   CALCIUM 9.2 07/22/2017   PROT 6.8 07/22/2017   ALBUMIN 3.8 07/22/2017   AST 18 07/22/2017   ALT 16 07/22/2017   ALKPHOS 95 07/22/2017   BILITOT 0.4 07/22/2017   GFRNONAA >60 07/22/2017   GFRAA >60 07/22/2017    Lab Results  Component Value Date   WBC 6.3 07/22/2017   NEUTROABS 4.5 07/22/2017   HGB 9.5 (L) 07/22/2017   HCT 29.2 (L) 07/22/2017   MCV 77.4 (L) 07/22/2017   PLT 238 07/22/2017     STUDIES: No results found.  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  PLAN:    1. 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 After lengthy discussion with the patient, she is highly interested in breast conservation and wants to minimize the amount of surgery.  Patient completed 4 cycles of neoadjuvant Adriamycin and Cytoxan on June 09, 2017. Once patient completes her chemotherapy, she will require lumpectomy followed by XRT as well as an aromatase inhibitor for 5 years. CT scan from April 13, 2017 did not reveal any metastatic disease.  Proceed with cycle 5 of 12 of weekly Taxol today.  Return to clinic in 1 week for laboratory work and consideration of cycle 6 and then in 2 weeks for further evaluation and consideration of cycle 7.  2. Right hand fracture: Continue monitoring treatment per orthopedics.  Proceed with injections for pain as needed.  Case discussed with Dr. Grandville Silos.   3. Neutropenia: Resolved. Secondary to chemotherapy. 4.  Pruritic skin: Recommended OTC topical ointments as needed. 5.  Anemia: Mild, monitor.   Patient expressed understanding and was in agreement with this plan. She also understands that She can call clinic at any time with any questions, concerns, or complaints.   Cancer Staging Primary cancer of upper outer quadrant of right female breast [facility]) Staging form: Breast, AJCC 8th Edition - Clinical stage from 03/27/2017: Stage IB (cT1c, cN1, cM0, G2, ER: Positive, PR: Positive, HER2: Negative) - Signed by Lloyd Huger, MD on 03/27/2017   Lloyd Huger, MD   07/22/2017 10:16 AM

## 2017-07-22 ENCOUNTER — Inpatient Hospital Stay: Payer: BLUE CROSS/BLUE SHIELD

## 2017-07-22 ENCOUNTER — Inpatient Hospital Stay: Payer: BLUE CROSS/BLUE SHIELD | Attending: Oncology | Admitting: Oncology

## 2017-07-22 ENCOUNTER — Other Ambulatory Visit: Payer: Self-pay

## 2017-07-22 VITALS — BP 153/88 | HR 93 | Temp 98.3°F | Resp 20 | Wt 188.7 lb

## 2017-07-22 DIAGNOSIS — F17210 Nicotine dependence, cigarettes, uncomplicated: Secondary | ICD-10-CM | POA: Diagnosis not present

## 2017-07-22 DIAGNOSIS — C50411 Malignant neoplasm of upper-outer quadrant of right female breast: Secondary | ICD-10-CM

## 2017-07-22 DIAGNOSIS — G4700 Insomnia, unspecified: Secondary | ICD-10-CM | POA: Diagnosis not present

## 2017-07-22 DIAGNOSIS — F419 Anxiety disorder, unspecified: Secondary | ICD-10-CM | POA: Insufficient documentation

## 2017-07-22 DIAGNOSIS — R5383 Other fatigue: Secondary | ICD-10-CM | POA: Insufficient documentation

## 2017-07-22 DIAGNOSIS — I10 Essential (primary) hypertension: Secondary | ICD-10-CM | POA: Insufficient documentation

## 2017-07-22 DIAGNOSIS — K219 Gastro-esophageal reflux disease without esophagitis: Secondary | ICD-10-CM | POA: Insufficient documentation

## 2017-07-22 DIAGNOSIS — Z5111 Encounter for antineoplastic chemotherapy: Secondary | ICD-10-CM | POA: Diagnosis present

## 2017-07-22 DIAGNOSIS — Z79899 Other long term (current) drug therapy: Secondary | ICD-10-CM | POA: Diagnosis not present

## 2017-07-22 DIAGNOSIS — Z170 Estrogen receptor positive status [ER+]: Secondary | ICD-10-CM | POA: Diagnosis not present

## 2017-07-22 DIAGNOSIS — D649 Anemia, unspecified: Secondary | ICD-10-CM

## 2017-07-22 DIAGNOSIS — R5381 Other malaise: Secondary | ICD-10-CM | POA: Diagnosis not present

## 2017-07-22 LAB — COMPREHENSIVE METABOLIC PANEL
ALBUMIN: 3.8 g/dL (ref 3.5–5.0)
ALT: 16 U/L (ref 14–54)
AST: 18 U/L (ref 15–41)
Alkaline Phosphatase: 95 U/L (ref 38–126)
Anion gap: 9 (ref 5–15)
BILIRUBIN TOTAL: 0.4 mg/dL (ref 0.3–1.2)
BUN: 10 mg/dL (ref 6–20)
CO2: 26 mmol/L (ref 22–32)
Calcium: 9.2 mg/dL (ref 8.9–10.3)
Chloride: 104 mmol/L (ref 101–111)
Creatinine, Ser: 0.78 mg/dL (ref 0.44–1.00)
GFR calc Af Amer: >60 mL/min (ref >60)
GLUCOSE: 98 mg/dL (ref 65–99)
POTASSIUM: 3.5 mmol/L (ref 3.5–5.1)
Sodium: 139 mmol/L (ref 135–145)
TOTAL PROTEIN: 6.8 g/dL (ref 6.5–8.1)

## 2017-07-22 LAB — CBC WITH DIFFERENTIAL/PLATELET
BASOS ABS: 0.1 10*3/uL (ref 0–0.1)
Basophils Relative: 1 %
Eosinophils Absolute: 0.3 10*3/uL (ref 0–0.7)
Eosinophils Relative: 5 %
HEMATOCRIT: 29.2 % — AB (ref 35.0–47.0)
HEMOGLOBIN: 9.5 g/dL — AB (ref 12.0–16.0)
LYMPHS PCT: 16 %
Lymphs Abs: 1 10*3/uL (ref 1.0–3.6)
MCH: 25.1 pg — ABNORMAL LOW (ref 26.0–34.0)
MCHC: 32.4 g/dL (ref 32.0–36.0)
MCV: 77.4 fL — AB (ref 80.0–100.0)
Monocytes Absolute: 0.4 10*3/uL (ref 0.2–0.9)
Monocytes Relative: 7 %
NEUTROS PCT: 71 %
Neutro Abs: 4.5 10*3/uL (ref 1.4–6.5)
Platelets: 238 10*3/uL (ref 150–440)
RBC: 3.77 MIL/uL — AB (ref 3.80–5.20)
RDW: 23.9 % — ABNORMAL HIGH (ref 11.5–14.5)
WBC: 6.3 10*3/uL (ref 3.6–11.0)

## 2017-07-22 MED ORDER — SODIUM CHLORIDE 0.9% FLUSH
10.0000 mL | INTRAVENOUS | Status: DC | PRN
  Administered 2017-07-22: 10 mL via INTRAVENOUS
  Filled 2017-07-22: qty 10

## 2017-07-22 MED ORDER — SODIUM CHLORIDE 0.9 % IV SOLN
Freq: Once | INTRAVENOUS | Status: AC
  Administered 2017-07-22: 10:00:00 via INTRAVENOUS
  Filled 2017-07-22: qty 1000

## 2017-07-22 MED ORDER — DEXAMETHASONE SODIUM PHOSPHATE 10 MG/ML IJ SOLN
10.0000 mg | Freq: Once | INTRAMUSCULAR | Status: AC
  Administered 2017-07-22: 10 mg via INTRAVENOUS
  Filled 2017-07-22: qty 1

## 2017-07-22 MED ORDER — SODIUM CHLORIDE 0.9 % IV SOLN
80.0000 mg/m2 | Freq: Once | INTRAVENOUS | Status: AC
  Administered 2017-07-22: 156 mg via INTRAVENOUS
  Filled 2017-07-22: qty 26

## 2017-07-22 MED ORDER — DIPHENHYDRAMINE HCL 50 MG/ML IJ SOLN
25.0000 mg | Freq: Once | INTRAMUSCULAR | Status: AC
  Administered 2017-07-22: 25 mg via INTRAVENOUS
  Filled 2017-07-22: qty 1

## 2017-07-22 MED ORDER — FAMOTIDINE IN NACL 20-0.9 MG/50ML-% IV SOLN
20.0000 mg | Freq: Once | INTRAVENOUS | Status: AC
  Administered 2017-07-22: 20 mg via INTRAVENOUS
  Filled 2017-07-22: qty 50

## 2017-07-22 MED ORDER — HEPARIN SOD (PORK) LOCK FLUSH 100 UNIT/ML IV SOLN
500.0000 [IU] | Freq: Once | INTRAVENOUS | Status: AC
  Administered 2017-07-22: 500 [IU] via INTRAVENOUS
  Filled 2017-07-22: qty 5

## 2017-07-22 NOTE — Progress Notes (Signed)
Patient here today for follow up and treatment consideration  regarding breast cancer. Patient denies concerns today.   

## 2017-07-24 ENCOUNTER — Encounter (HOSPITAL_COMMUNITY): Payer: Self-pay

## 2017-07-24 NOTE — Progress Notes (Unsigned)
Social worker provided counseling for patient in the cancer center.

## 2017-07-24 NOTE — Progress Notes (Signed)
Social worker provided counseling to patient in the cancer center. 

## 2017-07-28 ENCOUNTER — Inpatient Hospital Stay: Payer: BLUE CROSS/BLUE SHIELD

## 2017-07-28 VITALS — BP 126/82 | HR 73 | Temp 98.1°F | Resp 20

## 2017-07-28 DIAGNOSIS — C50411 Malignant neoplasm of upper-outer quadrant of right female breast: Secondary | ICD-10-CM

## 2017-07-28 LAB — CBC WITH DIFFERENTIAL/PLATELET
Basophils Absolute: 0.1 10*3/uL (ref 0–0.1)
Basophils Relative: 1 %
Eosinophils Absolute: 0.2 10*3/uL (ref 0–0.7)
Eosinophils Relative: 4 %
HCT: 29.3 % — ABNORMAL LOW (ref 35.0–47.0)
Hemoglobin: 9.4 g/dL — ABNORMAL LOW (ref 12.0–16.0)
LYMPHS ABS: 1.1 10*3/uL (ref 1.0–3.6)
LYMPHS PCT: 18 %
MCH: 25.3 pg — AB (ref 26.0–34.0)
MCHC: 32 g/dL (ref 32.0–36.0)
MCV: 79.1 fL — AB (ref 80.0–100.0)
MONO ABS: 0.4 10*3/uL (ref 0.2–0.9)
MONOS PCT: 6 %
Neutro Abs: 4.2 10*3/uL (ref 1.4–6.5)
Neutrophils Relative %: 71 %
Platelets: 258 10*3/uL (ref 150–440)
RBC: 3.7 MIL/uL — AB (ref 3.80–5.20)
RDW: 23 % — ABNORMAL HIGH (ref 11.5–14.5)
WBC: 5.9 10*3/uL (ref 3.6–11.0)

## 2017-07-28 LAB — COMPREHENSIVE METABOLIC PANEL
ALBUMIN: 3.6 g/dL (ref 3.5–5.0)
ALT: 14 U/L (ref 14–54)
AST: 18 U/L (ref 15–41)
Alkaline Phosphatase: 91 U/L (ref 38–126)
Anion gap: 9 (ref 5–15)
BUN: 9 mg/dL (ref 6–20)
CHLORIDE: 106 mmol/L (ref 101–111)
CO2: 25 mmol/L (ref 22–32)
Calcium: 8.9 mg/dL (ref 8.9–10.3)
Creatinine, Ser: 0.71 mg/dL (ref 0.44–1.00)
GFR calc Af Amer: >60 mL/min (ref >60)
GFR calc non Af Amer: >60 mL/min (ref >60)
GLUCOSE: 94 mg/dL (ref 65–99)
POTASSIUM: 3.6 mmol/L (ref 3.5–5.1)
SODIUM: 140 mmol/L (ref 135–145)
Total Bilirubin: 0.4 mg/dL (ref 0.3–1.2)
Total Protein: 6.5 g/dL (ref 6.5–8.1)

## 2017-07-28 MED ORDER — SODIUM CHLORIDE 0.9% FLUSH
10.0000 mL | INTRAVENOUS | Status: DC | PRN
  Administered 2017-07-28: 10 mL via INTRAVENOUS
  Filled 2017-07-28: qty 10

## 2017-07-28 MED ORDER — SODIUM CHLORIDE 0.9 % IV SOLN
Freq: Once | INTRAVENOUS | Status: AC
  Administered 2017-07-28: 09:00:00 via INTRAVENOUS
  Filled 2017-07-28: qty 1000

## 2017-07-28 MED ORDER — FAMOTIDINE IN NACL 20-0.9 MG/50ML-% IV SOLN
20.0000 mg | Freq: Once | INTRAVENOUS | Status: AC
  Administered 2017-07-28: 20 mg via INTRAVENOUS
  Filled 2017-07-28: qty 50

## 2017-07-28 MED ORDER — HEPARIN SOD (PORK) LOCK FLUSH 100 UNIT/ML IV SOLN
500.0000 [IU] | Freq: Once | INTRAVENOUS | Status: AC
  Administered 2017-07-28: 500 [IU] via INTRAVENOUS
  Filled 2017-07-28: qty 5

## 2017-07-28 MED ORDER — DIPHENHYDRAMINE HCL 50 MG/ML IJ SOLN
25.0000 mg | Freq: Once | INTRAMUSCULAR | Status: AC
Start: 2017-07-28 — End: 2017-07-28
  Administered 2017-07-28: 25 mg via INTRAVENOUS
  Filled 2017-07-28: qty 1

## 2017-07-28 MED ORDER — SODIUM CHLORIDE 0.9 % IV SOLN
80.0000 mg/m2 | Freq: Once | INTRAVENOUS | Status: AC
  Administered 2017-07-28: 156 mg via INTRAVENOUS
  Filled 2017-07-28: qty 26

## 2017-07-28 MED ORDER — DEXAMETHASONE SODIUM PHOSPHATE 10 MG/ML IJ SOLN
10.0000 mg | Freq: Once | INTRAMUSCULAR | Status: AC
  Administered 2017-07-28: 10 mg via INTRAVENOUS
  Filled 2017-07-28: qty 1

## 2017-07-29 ENCOUNTER — Ambulatory Visit: Payer: BLUE CROSS/BLUE SHIELD

## 2017-07-29 ENCOUNTER — Other Ambulatory Visit: Payer: BLUE CROSS/BLUE SHIELD

## 2017-07-30 ENCOUNTER — Encounter (HOSPITAL_COMMUNITY): Payer: Self-pay

## 2017-07-30 NOTE — Progress Notes (Signed)
Social worker provided counseling services to patient at the Cancer Center. 

## 2017-08-02 NOTE — Progress Notes (Signed)
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  INTERVAL HISTORY: Patient returns to clinic today for evaluation and consideration of cycle 7 of 12 of weekly Taxol.  She continues to tolerate her treatments well without significant side effects.  She admits to worsening insomnia as well as episodes of crying secondary to anxiety.  She denies any peripheral neuropathy or other neurologic complaints. She continues to have right hand pain secondary to fracture. She has a good appetite and denies weight loss. She has no chest pain or shortness of breath. She denies any nausea, vomiting, constipation, or diarrhea. She has no urinary complaints. Patient offers no further specific complaints today.  REVIEW OF SYSTEMS:   Review of Systems  Constitutional: Negative.  Negative for fever, malaise/fatigue and weight loss.  Respiratory: Negative.  Negative for cough and shortness of breath.   Cardiovascular: Negative.  Negative for chest pain and leg swelling.  Gastrointestinal: Negative.  Negative for abdominal pain.  Genitourinary: Negative.   Musculoskeletal: Positive for joint pain.  Skin: Negative.  Negative for itching and rash.  Neurological: Negative.  Negative for sensory change and weakness.  Psychiatric/Behavioral: The patient is nervous/anxious and has insomnia.     As per HPI. Otherwise, a complete review of systems is negative.  PAST MEDICAL HISTORY: Past Medical History:  Diagnosis Date  . Arthritis   . Cancer ([place])   . GERD (gastroesophageal reflux disease)    NO MEDS  . Heart murmur   . Hypertension   . Migraines   . Thumb fracture    LEFT-WEARING BRACE    PAST  SURGICAL HISTORY: Past Surgical History:  Procedure Laterality Date  . BREAST BIOPSY Right 03/19/2017   US guided biopsy of right breast and right lymph node  . CESAREAN SECTION    . FRACTURE SURGERY     left hand  . PILONIDAL CYST EXCISION  1980'S  . PORT-A-CATH REMOVAL N/A 04/27/2017   Procedure: REMOVAL OF PORT A CATH & insertion PORT A CATH LEFT INTERNAL JUGULAR;  Surgeon: Herbert Pun, MD;  Location: [facility] ORS;  Service: General;  Laterality: N/A;  . PORTACATH PLACEMENT N/A 04/15/2017   Procedure: INSERTION PORT-A-CATH;  Surgeon: Herbert Pun, MD;  Location: [facility] ORS;  Service: General;  Laterality: N/A;    FAMILY HISTORY: Family History  Problem Relation Age of Onset  . Breast cancer Maternal Grandmother   . Hypertension Maternal Grandmother   . Angina Mother   . Lung cancer Father   . Diabetes Father   . Prostate cancer Maternal Uncle   . Dementia Maternal Grandfather   . Congestive Heart Failure Maternal Grandfather   . Heart disease Paternal Grandmother     ADVANCED DIRECTIVES (Y/N):  N  HEALTH MAINTENANCE: Social History   Tobacco Use  . Smoking status: Current Every Day Smoker    Packs/day: 2.00    Years: 18.00    Pack years: 36.00    Types: Cigarettes  . Smokeless tobacco: Never Used  . Tobacco comment: PT IS NOW DOWN TO 1/2 PACK SINCE CANCER DIAGNOSIS   Substance Use Topics  . Alcohol use: No  . Drug use: No     Colonoscopy:  PAP:  Bone density:  Lipid panel:  Allergies  Allergen Reactions  . Hydrocodone-Acetaminophen Itching and Rash  . Penicillins Swelling, Other (See Comments) and Rash    Throat swelling Has patient had a PCN reaction causing immediate rash, facial/tongue/throat swelling, SOB or lightheadedness with hypotension: Yes Has patient had a PCN reaction causing severe rash involving mucus membranes or skin necrosis: No Has patient had a PCN reaction that required hospitalization: No Has patient had a PCN reaction  occurring within the last 10 years: No If all of the above answers are "NO", then may proceed with Cephalosporin use.     Current Outpatient Medications  Medication Sig Dispense Refill  . acetaminophen (TYLENOL) 500 MG tablet Take 1,000 mg by mouth every 8 (eight) hours as needed for mild pain.    . bisoprolol-hydrochlorothiazide (ZIAC) 5-6.25 MG tablet TAKE 1 TABLET BY MOUTH ONCE DAILY-AM    . buPROPion (WELLBUTRIN SR) 200 MG 12 hr tablet Take 200 mg by mouth 2 (two) times daily.    . diclofenac sodium (VOLTAREN) 1 % GEL APPLY 2 GRAMS TO AFFECTED AREA UP TO 4 TIMES A DAY AS NEEDED  0  . lidocaine-prilocaine (EMLA) cream Apply to affected area once 30 g 3  . loratadine (CLARITIN) 10 MG tablet Take 10 mg by mouth daily as needed for allergies.    Marland Kitchen ondansetron (ZOFRAN) 8 MG tablet Take 1 tablet (8 mg total) by mouth 2 (two) times daily as needed. 60 tablet 2  . prochlorperazine (COMPAZINE) 10 MG tablet Take 1 tablet (10 mg total) by mouth every 6 (six) hours as needed (Nausea or vomiting). 60 tablet 2  . traMADol (ULTRAM) 50 MG tablet Take 50 mg by mouth daily.     Marland Kitchen ALPRAZolam (XANAX) 0.25 MG tablet Take 1 tablet (0.25 mg total) by mouth at bedtime as needed for anxiety. 30 tablet 1  . promethazine (PHENERGAN) 25 MG suppository Place 1 suppository (25 mg total) rectally every 6 (six) hours as needed for nausea or vomiting. 30 each 0   No current facility-administered medications for this visit.    Facility-Administered Medications Ordered in Other Visits  Medication Dose Route Frequency Provider Last Rate Last Dose  . sodium chloride flush (NS) 0.9 % injection 10 mL  10 mL Intracatheter PRN Lloyd Huger, MD   10 mL at 07/15/17 0855    OBJECTIVE: Vitals:   08/04/17 0848  BP: (!) 168/91  Pulse: 89  Temp: (!) 97.4 F (36.3 C)     Body mass index is 32.91 kg/m.    ECOG FS:0 - Asymptomatic  General: Well-developed, well-nourished, no acute distress. Eyes: Pink conjunctiva,  anicteric sclera. Breasts: Patient requested exam be deferred today. Lungs: Clear to auscultation bilaterally. Heart: Regular rate and rhythm. No rubs, murmurs, or gallops. Abdomen: Soft, nontender, nondistended. No organomegaly noted, normoactive bowel sounds. Musculoskeletal: No edema, cyanosis, or clubbing. Neuro: Alert, answering all questions appropriately. Cranial nerves grossly intact. Skin: No rashes or petechiae noted. Psych: Normal affect.   LAB RESULTS:  Lab Results  Component Value Date   NA 140 08/04/2017   K 3.6 08/04/2017   CL 106 08/04/2017   CO2 26 08/04/2017   GLUCOSE 103 (H) 08/04/2017   BUN 8 08/04/2017   CREATININE 0.66 08/04/2017   CALCIUM 8.8 (L) 08/04/2017   PROT 6.7 08/04/2017   ALBUMIN 3.8 08/04/2017   AST 23 08/04/2017   ALT 18 08/04/2017   ALKPHOS 80 08/04/2017   BILITOT 0.3 08/04/2017   GFRNONAA >60 08/04/2017   GFRAA >60 08/04/2017  Lab Results  Component Value Date   WBC 6.4 08/04/2017   NEUTROABS 4.4 08/04/2017   HGB 9.3 (L) 08/04/2017   HCT 28.8 (L) 08/04/2017   MCV 80.4 08/04/2017   PLT 317 08/04/2017     STUDIES: No results found.  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  PLAN:    1. 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 After lengthy discussion with the patient, she is highly interested in breast conservation and wants to minimize the amount of surgery.  Patient completed 4 cycles of neoadjuvant Adriamycin and Cytoxan on June 09, 2017. Once patient completes her chemotherapy, she will require lumpectomy followed by XRT as well as an aromatase inhibitor for 5 years. CT scan from April 13, 2017 did not reveal any metastatic disease.  Proceed with cycle 7 of 12 of  weekly Taxol today.  Return to clinic in 1 week for laboratory work and consideration of cycle 8 and then in 2 weeks for further evaluation and consideration of cycle 9.  2. Right hand fracture: Continue monitoring treatment per orthopedics.  Proceed with injections for pain as needed.  Case discussed with Dr. Grandville Silos.   3. Neutropenia: Resolved. Secondary to chemotherapy. 4.  Pruritic skin: Recommended OTC topical ointments as needed. 5.  Anemia: Mild, monitor. 6.  Insomnia/anxiety: Patient was given a prescription for Xanax today.   Patient expressed understanding and was in agreement with this plan. She also understands that She can call clinic at any time with any questions, concerns, or complaints.   Cancer Staging Primary cancer of upper outer quadrant of right female breast [facility]) Staging form: Breast, AJCC 8th Edition - Clinical stage from 03/27/2017: Stage IB (cT1c, cN1, cM0, G2, ER: Positive, PR: Positive, HER2: Negative) - Signed by Lloyd Huger, MD on 03/27/2017   Lloyd Huger, MD   08/05/2017 4:41 PM

## 2017-08-04 ENCOUNTER — Inpatient Hospital Stay: Payer: BLUE CROSS/BLUE SHIELD

## 2017-08-04 ENCOUNTER — Other Ambulatory Visit: Payer: Self-pay

## 2017-08-04 ENCOUNTER — Inpatient Hospital Stay: Payer: BLUE CROSS/BLUE SHIELD | Admitting: Oncology

## 2017-08-04 ENCOUNTER — Encounter: Payer: Self-pay | Admitting: Oncology

## 2017-08-04 VITALS — BP 168/91 | HR 89 | Temp 97.4°F | Wt 191.7 lb

## 2017-08-04 DIAGNOSIS — F419 Anxiety disorder, unspecified: Secondary | ICD-10-CM | POA: Diagnosis not present

## 2017-08-04 DIAGNOSIS — C50411 Malignant neoplasm of upper-outer quadrant of right female breast: Secondary | ICD-10-CM

## 2017-08-04 DIAGNOSIS — G4700 Insomnia, unspecified: Secondary | ICD-10-CM | POA: Diagnosis not present

## 2017-08-04 DIAGNOSIS — E785 Hyperlipidemia, unspecified: Secondary | ICD-10-CM | POA: Insufficient documentation

## 2017-08-04 DIAGNOSIS — Z170 Estrogen receptor positive status [ER+]: Secondary | ICD-10-CM | POA: Diagnosis not present

## 2017-08-04 LAB — COMPREHENSIVE METABOLIC PANEL
ALBUMIN: 3.8 g/dL (ref 3.5–5.0)
ALK PHOS: 80 U/L (ref 38–126)
ALT: 18 U/L (ref 14–54)
AST: 23 U/L (ref 15–41)
Anion gap: 8 (ref 5–15)
BILIRUBIN TOTAL: 0.3 mg/dL (ref 0.3–1.2)
BUN: 8 mg/dL (ref 6–20)
CALCIUM: 8.8 mg/dL — AB (ref 8.9–10.3)
CO2: 26 mmol/L (ref 22–32)
Chloride: 106 mmol/L (ref 101–111)
Creatinine, Ser: 0.66 mg/dL (ref 0.44–1.00)
GFR calc Af Amer: >60 mL/min (ref >60)
GFR calc non Af Amer: >60 mL/min (ref >60)
Glucose, Bld: 103 mg/dL — ABNORMAL HIGH (ref 65–99)
Potassium: 3.6 mmol/L (ref 3.5–5.1)
Sodium: 140 mmol/L (ref 135–145)
TOTAL PROTEIN: 6.7 g/dL (ref 6.5–8.1)

## 2017-08-04 LAB — CBC WITH DIFFERENTIAL/PLATELET
BASOS ABS: 0 10*3/uL (ref 0–0.1)
BASOS PCT: 1 %
Eosinophils Absolute: 0.2 10*3/uL (ref 0–0.7)
Eosinophils Relative: 3 %
HEMATOCRIT: 28.8 % — AB (ref 35.0–47.0)
HEMOGLOBIN: 9.3 g/dL — AB (ref 12.0–16.0)
Lymphocytes Relative: 18 %
Lymphs Abs: 1.2 10*3/uL (ref 1.0–3.6)
MCH: 26.1 pg (ref 26.0–34.0)
MCHC: 32.4 g/dL (ref 32.0–36.0)
MCV: 80.4 fL (ref 80.0–100.0)
Monocytes Absolute: 0.6 10*3/uL (ref 0.2–0.9)
Monocytes Relative: 9 %
NEUTROS PCT: 69 %
Neutro Abs: 4.4 10*3/uL (ref 1.4–6.5)
Platelets: 317 10*3/uL (ref 150–440)
RBC: 3.59 MIL/uL — ABNORMAL LOW (ref 3.80–5.20)
RDW: 21.7 % — ABNORMAL HIGH (ref 11.5–14.5)
WBC: 6.4 10*3/uL (ref 3.6–11.0)

## 2017-08-04 MED ORDER — FAMOTIDINE IN NACL 20-0.9 MG/50ML-% IV SOLN
20.0000 mg | Freq: Once | INTRAVENOUS | Status: AC
  Administered 2017-08-04: 20 mg via INTRAVENOUS
  Filled 2017-08-04: qty 50

## 2017-08-04 MED ORDER — SODIUM CHLORIDE 0.9% FLUSH
10.0000 mL | INTRAVENOUS | Status: DC | PRN
  Administered 2017-08-04: 10 mL via INTRAVENOUS
  Filled 2017-08-04: qty 10

## 2017-08-04 MED ORDER — HEPARIN SOD (PORK) LOCK FLUSH 100 UNIT/ML IV SOLN
500.0000 [IU] | Freq: Once | INTRAVENOUS | Status: AC
  Administered 2017-08-04: 500 [IU] via INTRAVENOUS
  Filled 2017-08-04: qty 5

## 2017-08-04 MED ORDER — DEXAMETHASONE SODIUM PHOSPHATE 10 MG/ML IJ SOLN
10.0000 mg | Freq: Once | INTRAMUSCULAR | Status: AC
  Administered 2017-08-04: 10 mg via INTRAVENOUS
  Filled 2017-08-04: qty 1

## 2017-08-04 MED ORDER — SODIUM CHLORIDE 0.9 % IV SOLN
80.0000 mg/m2 | Freq: Once | INTRAVENOUS | Status: AC
  Administered 2017-08-04: 156 mg via INTRAVENOUS
  Filled 2017-08-04: qty 26

## 2017-08-04 MED ORDER — SODIUM CHLORIDE 0.9 % IV SOLN
Freq: Once | INTRAVENOUS | Status: AC
  Administered 2017-08-04: 10:00:00 via INTRAVENOUS
  Filled 2017-08-04: qty 1000

## 2017-08-04 MED ORDER — ALPRAZOLAM 0.25 MG PO TABS: 0.2500 mg | ORAL_TABLET | Freq: Every evening | ORAL | 1 refills | Status: DC | PRN

## 2017-08-04 MED ORDER — DIPHENHYDRAMINE HCL 50 MG/ML IJ SOLN
25.0000 mg | Freq: Once | INTRAMUSCULAR | Status: AC
  Administered 2017-08-04: 25 mg via INTRAVENOUS
  Filled 2017-08-04: qty 1

## 2017-08-05 ENCOUNTER — Telehealth: Payer: Self-pay | Admitting: *Deleted

## 2017-08-05 ENCOUNTER — Ambulatory Visit: Payer: BLUE CROSS/BLUE SHIELD

## 2017-08-05 ENCOUNTER — Other Ambulatory Visit: Payer: BLUE CROSS/BLUE SHIELD

## 2017-08-05 ENCOUNTER — Ambulatory Visit: Payer: BLUE CROSS/BLUE SHIELD | Admitting: Oncology

## 2017-08-05 MED ORDER — PROMETHAZINE HCL 25 MG RE SUPP: 25.0000 mg | Freq: Four times a day (QID) | RECTAL | 0 refills | Status: DC | PRN

## 2017-08-05 NOTE — Telephone Encounter (Signed)
Per VO Dr Grayland Ormond Phenergan supp 25 mg every 6 hours as needed #30 if no improvement tomorrow, come in to see NP in [facility]. Patient informed and agrees with this plan

## 2017-08-05 NOTE — Telephone Encounter (Signed)
Patient called to report that she is vomiting and cannot take her nausea medicine because she vomits it up as soon as it hits her stomach. Also reports that she is very dizzy. Asking for a rectal nausea medicine to be sent to pharmacy [facility]. Please advise

## 2017-08-05 NOTE — Telephone Encounter (Signed)
Patient called to report that she slept for 5 straight hours and is so pleased with the new medicine and said "[place]"

## 2017-08-11 ENCOUNTER — Inpatient Hospital Stay: Payer: BLUE CROSS/BLUE SHIELD

## 2017-08-11 ENCOUNTER — Other Ambulatory Visit: Payer: Self-pay | Admitting: Oncology

## 2017-08-11 VITALS — BP 140/85 | HR 78 | Temp 97.5°F | Resp 20 | Wt 193.0 lb

## 2017-08-11 DIAGNOSIS — C50411 Malignant neoplasm of upper-outer quadrant of right female breast: Secondary | ICD-10-CM | POA: Diagnosis not present

## 2017-08-11 LAB — COMPREHENSIVE METABOLIC PANEL
ALBUMIN: 3.7 g/dL (ref 3.5–5.0)
ALT: 18 U/L (ref 14–54)
ANION GAP: 9 (ref 5–15)
AST: 20 U/L (ref 15–41)
Alkaline Phosphatase: 84 U/L (ref 38–126)
BUN: 9 mg/dL (ref 6–20)
CO2: 26 mmol/L (ref 22–32)
Calcium: 9.1 mg/dL (ref 8.9–10.3)
Chloride: 105 mmol/L (ref 101–111)
Creatinine, Ser: 0.7 mg/dL (ref 0.44–1.00)
GFR calc Af Amer: >60 mL/min (ref >60)
GFR calc non Af Amer: >60 mL/min (ref >60)
GLUCOSE: 99 mg/dL (ref 65–99)
POTASSIUM: 3.7 mmol/L (ref 3.5–5.1)
SODIUM: 140 mmol/L (ref 135–145)
TOTAL PROTEIN: 6.9 g/dL (ref 6.5–8.1)
Total Bilirubin: 0.3 mg/dL (ref 0.3–1.2)

## 2017-08-11 LAB — CBC WITH DIFFERENTIAL/PLATELET
BASOS ABS: 0 10*3/uL (ref 0–0.1)
Basophils Relative: 1 %
Eosinophils Absolute: 0.1 10*3/uL (ref 0–0.7)
Eosinophils Relative: 2 %
HEMATOCRIT: 31.2 % — AB (ref 35.0–47.0)
Hemoglobin: 10.1 g/dL — ABNORMAL LOW (ref 12.0–16.0)
LYMPHS PCT: 20 %
Lymphs Abs: 1.1 10*3/uL (ref 1.0–3.6)
MCH: 26.3 pg (ref 26.0–34.0)
MCHC: 32.2 g/dL (ref 32.0–36.0)
MCV: 81.6 fL (ref 80.0–100.0)
MONO ABS: 0.4 10*3/uL (ref 0.2–0.9)
MONOS PCT: 8 %
NEUTROS ABS: 3.8 10*3/uL (ref 1.4–6.5)
Neutrophils Relative %: 69 %
Platelets: 310 10*3/uL (ref 150–440)
RBC: 3.83 MIL/uL (ref 3.80–5.20)
RDW: 20.4 % — AB (ref 11.5–14.5)
WBC: 5.5 10*3/uL (ref 3.6–11.0)

## 2017-08-11 MED ORDER — SODIUM CHLORIDE 0.9 % IV SOLN
Freq: Once | INTRAVENOUS | Status: AC
  Administered 2017-08-11: 10:00:00 via INTRAVENOUS
  Filled 2017-08-11: qty 1000

## 2017-08-11 MED ORDER — HEPARIN SOD (PORK) LOCK FLUSH 100 UNIT/ML IV SOLN
500.0000 [IU] | Freq: Once | INTRAVENOUS | Status: AC
  Administered 2017-08-11: 500 [IU] via INTRAVENOUS
  Filled 2017-08-11: qty 5

## 2017-08-11 MED ORDER — SODIUM CHLORIDE 0.9% FLUSH
10.0000 mL | INTRAVENOUS | Status: DC | PRN
  Filled 2017-08-11: qty 10

## 2017-08-11 MED ORDER — DIPHENHYDRAMINE HCL 50 MG/ML IJ SOLN
25.0000 mg | Freq: Once | INTRAMUSCULAR | Status: AC
  Administered 2017-08-11: 25 mg via INTRAVENOUS
  Filled 2017-08-11: qty 1

## 2017-08-11 MED ORDER — SODIUM CHLORIDE 0.9% FLUSH
10.0000 mL | INTRAVENOUS | Status: DC | PRN
  Administered 2017-08-11 (×2): 10 mL via INTRAVENOUS
  Filled 2017-08-11: qty 10

## 2017-08-11 MED ORDER — HEPARIN SOD (PORK) LOCK FLUSH 100 UNIT/ML IV SOLN: 500.0000 [IU] | Freq: Once | INTRAVENOUS | Status: DC | PRN

## 2017-08-11 MED ORDER — SODIUM CHLORIDE 0.9 % IV SOLN
72.0000 mg/m2 | Freq: Once | INTRAVENOUS | Status: AC
  Administered 2017-08-11: 144 mg via INTRAVENOUS
  Filled 2017-08-11: qty 24

## 2017-08-11 MED ORDER — FAMOTIDINE IN NACL 20-0.9 MG/50ML-% IV SOLN
20.0000 mg | Freq: Once | INTRAVENOUS | Status: AC
  Administered 2017-08-11: 20 mg via INTRAVENOUS
  Filled 2017-08-11: qty 50

## 2017-08-11 MED ORDER — DEXAMETHASONE SODIUM PHOSPHATE 10 MG/ML IJ SOLN
10.0000 mg | Freq: Once | INTRAMUSCULAR | Status: AC
  Administered 2017-08-11: 10 mg via INTRAVENOUS
  Filled 2017-08-11: qty 1

## 2017-08-11 NOTE — Progress Notes (Signed)
Pt reports increased "burning"in bilateral feet at night, that does not interfere with ADLs at this time. Pt also reports increased pain in nailbeds. Dr. Grayland Ormond aware, and okay to treat with reduced dose.

## 2017-08-16 NOTE — Progress Notes (Signed)
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  INTERVAL HISTORY: Patient returns to clinic today for evaluation and consideration of cycle 9 of 12 of weekly Taxol.  She continues to tolerate her treatments well without significant side effects.  She is sleeping better, but continues to have episodes of anxiety.  She has noticed increased weakness and fatigue, but still remains active.  She denies any peripheral neuropathy or other neurologic complaints. She continues to have right hand pain secondary to fracture. She has a good appetite and denies weight loss. She has no chest pain or shortness of breath. She denies any nausea, vomiting, constipation, or diarrhea. She has no urinary complaints. Patient offers no further specific complaints today.  REVIEW OF SYSTEMS:   Review of Systems  Constitutional: Positive for malaise/fatigue. Negative for fever and weight loss.  Respiratory: Negative.  Negative for cough and shortness of breath.   Cardiovascular: Negative.  Negative for chest pain and leg swelling.  Gastrointestinal: Negative.  Negative for abdominal pain.  Genitourinary: Negative.   Musculoskeletal: Positive for joint pain.  Skin: Negative.  Negative for itching and rash.  Neurological: Positive for weakness. Negative for sensory change.  Psychiatric/Behavioral: The patient is nervous/anxious and has insomnia.     As per HPI. Otherwise, a complete review of systems is negative.  PAST MEDICAL HISTORY: Past Medical History:  Diagnosis Date  . Arthritis   . Cancer ([place])   . GERD (gastroesophageal reflux disease)    NO MEDS  . Heart murmur   . Hypertension   . Migraines    . Thumb fracture    LEFT-WEARING BRACE    PAST SURGICAL HISTORY: Past Surgical History:  Procedure Laterality Date  . BREAST BIOPSY Right 03/19/2017   US guided biopsy of right breast and right lymph node  . CESAREAN SECTION    . FRACTURE SURGERY     left hand  . PILONIDAL CYST EXCISION  1980'S  . PORT-A-CATH REMOVAL N/A 04/27/2017   Procedure: REMOVAL OF PORT A CATH & insertion PORT A CATH LEFT INTERNAL JUGULAR;  Surgeon: Herbert Pun, MD;  Location: [facility] ORS;  Service: General;  Laterality: N/A;  . PORTACATH PLACEMENT N/A 04/15/2017   Procedure: INSERTION PORT-A-CATH;  Surgeon: Herbert Pun, MD;  Location: [facility] ORS;  Service: General;  Laterality: N/A;    FAMILY HISTORY: Family History  Problem Relation Age of Onset  . Breast cancer Maternal Grandmother   . Hypertension Maternal Grandmother   . Angina Mother   . Lung cancer Father   . Diabetes Father   . Prostate cancer Maternal Uncle   . Dementia Maternal Grandfather   . Congestive Heart Failure Maternal Grandfather   . Heart disease Paternal Grandmother     ADVANCED DIRECTIVES (Y/N):  N  HEALTH MAINTENANCE: Social History   Tobacco Use  . Smoking status: Current Every Day Smoker    Packs/day: 2.00    Years: 18.00    Pack years: 36.00    Types: Cigarettes  . Smokeless tobacco: Never Used  . Tobacco comment: PT IS NOW DOWN TO 1/2 PACK SINCE CANCER DIAGNOSIS   Substance Use Topics  . Alcohol use: No  . Drug use: No     Colonoscopy:  PAP:  Bone density:  Lipid panel:  Allergies  Allergen Reactions  . Hydrocodone-Acetaminophen Itching and Rash  . Penicillins Swelling, Other (See Comments) and Rash    Throat swelling Has patient had a PCN reaction causing immediate rash, facial/tongue/throat swelling, SOB or lightheadedness with hypotension: Yes Has patient had a PCN reaction causing severe rash involving mucus membranes or skin necrosis: No Has patient had a PCN reaction that required  hospitalization: No Has patient had a PCN reaction occurring within the last 10 years: No If all of the above answers are "NO", then may proceed with Cephalosporin use.     Current Outpatient Medications  Medication Sig Dispense Refill  . acetaminophen (TYLENOL) 500 MG tablet Take 1,000 mg by mouth every 8 (eight) hours as needed for mild pain.    Marland Kitchen ALPRAZolam (XANAX) 0.25 MG tablet Take 1 tablet (0.25 mg total) by mouth at bedtime as needed for anxiety. 30 tablet 1  . bisoprolol-hydrochlorothiazide (ZIAC) 5-6.25 MG tablet TAKE 1 TABLET BY MOUTH ONCE DAILY-AM    . buPROPion (WELLBUTRIN SR) 200 MG 12 hr tablet Take 200 mg by mouth 2 (two) times daily.    . diclofenac sodium (VOLTAREN) 1 % GEL APPLY 2 GRAMS TO AFFECTED AREA UP TO 4 TIMES A DAY AS NEEDED  0  . lidocaine-prilocaine (EMLA) cream Apply to affected area once 30 g 3  . loratadine (CLARITIN) 10 MG tablet Take 10 mg by mouth daily as needed for allergies.    Marland Kitchen ondansetron (ZOFRAN) 8 MG tablet Take 1 tablet (8 mg total) by mouth 2 (two) times daily as needed. 60 tablet 2  . prochlorperazine (COMPAZINE) 10 MG tablet Take 1 tablet (10 mg total) by mouth every 6 (six) hours as needed (Nausea or vomiting). 60 tablet 2  . promethazine (PHENERGAN) 25 MG suppository Place 1 suppository (25 mg total) rectally every 6 (six) hours as needed for nausea or vomiting. 30 each 0  . traMADol (ULTRAM) 50 MG tablet Take 50 mg by mouth daily.      No current facility-administered medications for this visit.    Facility-Administered Medications Ordered in Other Visits  Medication Dose Route Frequency Provider Last Rate Last Dose  . 0.9 %  sodium chloride infusion   Intravenous Once Lloyd Huger, MD      . dexamethasone (DECADRON) injection 10 mg  10 mg Intravenous Once Lloyd Huger, MD      . diphenhydrAMINE (BENADRYL) injection 25 mg  25 mg Intravenous Once Lloyd Huger, MD      . famotidine (PEPCID) IVPB 20 mg premix  20 mg  Intravenous Once Lloyd Huger, MD      . heparin lock flush 100 unit/mL  500 Units Intravenous Once Lloyd Huger, MD      . PACLitaxel (TAXOL) 144 mg in sodium chloride 0.9 % 250 mL chemo infusion (</= '80mg'$ /m2)  72 mg/m2 (Order-Specific) Intravenous Once Lloyd Huger, MD      . sodium chloride flush (NS) 0.9 % injection 10 mL  10 mL Intracatheter PRN Lloyd Huger, MD   10 mL at 07/15/17 0855  . sodium chloride flush (NS) 0.9 % injection 10 mL  10 mL Intravenous PRN Lloyd Huger, MD   10 mL at 08/18/17 0844    OBJECTIVE: Vitals:   08/18/17 0932  BP: 129/83  Pulse: 96  Resp: 18  Temp: 99.1 F (37.3 C)     Body mass index is 33.16 kg/m.    ECOG  FS:0 - Asymptomatic  General: Well-developed, well-nourished, no acute distress. Eyes: Pink conjunctiva, anicteric sclera. Breasts: Patient requested exam be deferred today. Lungs: Clear to auscultation bilaterally. Heart: Regular rate and rhythm. No rubs, murmurs, or gallops. Abdomen: Soft, nontender, nondistended. No organomegaly noted, normoactive bowel sounds. Musculoskeletal: No edema, cyanosis, or clubbing. Neuro: Alert, answering all questions appropriately. Cranial nerves grossly intact. Skin: No rashes or petechiae noted. Psych: Normal affect.   LAB RESULTS:  Lab Results  Component Value Date   NA 139 08/18/2017   K 3.7 08/18/2017   CL 104 08/18/2017   CO2 25 08/18/2017   GLUCOSE 102 (H) 08/18/2017   BUN 10 08/18/2017   CREATININE 0.80 08/18/2017   CALCIUM 9.0 08/18/2017   PROT 6.7 08/18/2017   ALBUMIN 3.7 08/18/2017   AST 22 08/18/2017   ALT 17 08/18/2017   ALKPHOS 84 08/18/2017   BILITOT 0.4 08/18/2017   GFRNONAA >60 08/18/2017   GFRAA >60 08/18/2017    Lab Results  Component Value Date   WBC 6.0 08/18/2017   NEUTROABS 4.3 08/18/2017   HGB 9.9 (L) 08/18/2017   HCT 30.9 (L) 08/18/2017   MCV 82.5 08/18/2017   PLT 278 08/18/2017     STUDIES: No results found.  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  PLAN:    1. 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 After lengthy discussion with the patient, she is highly interested in breast conservation and wants to minimize the amount of surgery.  Patient completed 4 cycles of neoadjuvant Adriamycin and Cytoxan on June 09, 2017. Once patient completes her chemotherapy, she will require lumpectomy followed by XRT as well as an aromatase inhibitor for 5 years. CT scan from April 13, 2017 did not reveal any metastatic disease.  Proceed with cycle 9 of 12 of weekly Taxol today.  Return to clinic in 1 week for laboratory work and consideration of cycle 10 and then in 2 weeks for further evaluation and consideration of cycle 11.  2. Right hand fracture: Continue monitoring treatment per orthopedics.  Proceed with injections for pain as needed.  Case discussed with Dr. Grandville Silos.   3. Neutropenia: Resolved. Secondary to chemotherapy. 4.  Pruritic skin: Patient does not complain of this today.  Recommended OTC topical ointments as needed. 5.  Anemia: Mild, monitor. 6.  Insomnia/anxiety: Continue Xanax as needed.  Patient expressed understanding and was in agreement with this plan. She also understands that She can call clinic at any time with any questions, concerns, or complaints.   Cancer Staging Primary cancer of upper outer quadrant of right female breast [facility]) Staging form: Breast, AJCC 8th Edition - Clinical stage from 03/27/2017: Stage IB (cT1c, cN1, cM0, G2, ER: Positive, PR: Positive, HER2: Negative) - Signed by Lloyd Huger, MD on 03/27/2017   Lloyd Huger, MD   08/18/2017 9:57 AM

## 2017-08-17 ENCOUNTER — Other Ambulatory Visit: Payer: Self-pay | Admitting: Oncology

## 2017-08-18 ENCOUNTER — Inpatient Hospital Stay: Payer: BLUE CROSS/BLUE SHIELD | Admitting: Oncology

## 2017-08-18 ENCOUNTER — Inpatient Hospital Stay: Payer: BLUE CROSS/BLUE SHIELD

## 2017-08-18 VITALS — BP 129/83 | HR 96 | Temp 99.1°F | Resp 18 | Wt 193.2 lb

## 2017-08-18 DIAGNOSIS — R5383 Other fatigue: Secondary | ICD-10-CM | POA: Diagnosis not present

## 2017-08-18 DIAGNOSIS — F419 Anxiety disorder, unspecified: Secondary | ICD-10-CM | POA: Diagnosis not present

## 2017-08-18 DIAGNOSIS — R5381 Other malaise: Secondary | ICD-10-CM | POA: Diagnosis not present

## 2017-08-18 DIAGNOSIS — D649 Anemia, unspecified: Secondary | ICD-10-CM | POA: Diagnosis not present

## 2017-08-18 DIAGNOSIS — G4700 Insomnia, unspecified: Secondary | ICD-10-CM

## 2017-08-18 DIAGNOSIS — C50411 Malignant neoplasm of upper-outer quadrant of right female breast: Secondary | ICD-10-CM

## 2017-08-18 LAB — COMPREHENSIVE METABOLIC PANEL
ALBUMIN: 3.7 g/dL (ref 3.5–5.0)
ALK PHOS: 84 U/L (ref 38–126)
ALT: 17 U/L (ref 14–54)
ANION GAP: 10 (ref 5–15)
AST: 22 U/L (ref 15–41)
BUN: 10 mg/dL (ref 6–20)
CALCIUM: 9 mg/dL (ref 8.9–10.3)
CHLORIDE: 104 mmol/L (ref 101–111)
CO2: 25 mmol/L (ref 22–32)
Creatinine, Ser: 0.8 mg/dL (ref 0.44–1.00)
GFR calc non Af Amer: >60 mL/min (ref >60)
GLUCOSE: 102 mg/dL — AB (ref 65–99)
POTASSIUM: 3.7 mmol/L (ref 3.5–5.1)
SODIUM: 139 mmol/L (ref 135–145)
Total Bilirubin: 0.4 mg/dL (ref 0.3–1.2)
Total Protein: 6.7 g/dL (ref 6.5–8.1)

## 2017-08-18 LAB — CBC WITH DIFFERENTIAL/PLATELET
BASOS PCT: 1 %
Basophils Absolute: 0.1 10*3/uL (ref 0–0.1)
Eosinophils Absolute: 0.1 10*3/uL (ref 0–0.7)
Eosinophils Relative: 2 %
HEMATOCRIT: 30.9 % — AB (ref 35.0–47.0)
Hemoglobin: 9.9 g/dL — ABNORMAL LOW (ref 12.0–16.0)
LYMPHS PCT: 17 %
Lymphs Abs: 1 10*3/uL (ref 1.0–3.6)
MCH: 26.5 pg (ref 26.0–34.0)
MCHC: 32.1 g/dL (ref 32.0–36.0)
MCV: 82.5 fL (ref 80.0–100.0)
MONO ABS: 0.5 10*3/uL (ref 0.2–0.9)
MONOS PCT: 8 %
NEUTROS ABS: 4.3 10*3/uL (ref 1.4–6.5)
NEUTROS PCT: 72 %
Platelets: 278 10*3/uL (ref 150–440)
RBC: 3.74 MIL/uL — ABNORMAL LOW (ref 3.80–5.20)
RDW: 18.6 % — ABNORMAL HIGH (ref 11.5–14.5)
WBC: 6 10*3/uL (ref 3.6–11.0)

## 2017-08-18 MED ORDER — DEXAMETHASONE SODIUM PHOSPHATE 10 MG/ML IJ SOLN
10.0000 mg | Freq: Once | INTRAMUSCULAR | Status: AC
  Administered 2017-08-18: 10 mg via INTRAVENOUS
  Filled 2017-08-18: qty 1

## 2017-08-18 MED ORDER — HEPARIN SOD (PORK) LOCK FLUSH 100 UNIT/ML IV SOLN
500.0000 [IU] | Freq: Once | INTRAVENOUS | Status: AC
  Administered 2017-08-18: 500 [IU] via INTRAVENOUS
  Filled 2017-08-18: qty 5

## 2017-08-18 MED ORDER — SODIUM CHLORIDE 0.9 % IV SOLN
Freq: Once | INTRAVENOUS | Status: AC
  Administered 2017-08-18: 10:00:00 via INTRAVENOUS
  Filled 2017-08-18: qty 1000

## 2017-08-18 MED ORDER — DIPHENHYDRAMINE HCL 50 MG/ML IJ SOLN
25.0000 mg | Freq: Once | INTRAMUSCULAR | Status: AC
  Administered 2017-08-18: 25 mg via INTRAVENOUS
  Filled 2017-08-18: qty 1

## 2017-08-18 MED ORDER — SODIUM CHLORIDE 0.9 % IV SOLN
72.0000 mg/m2 | Freq: Once | INTRAVENOUS | Status: AC
  Administered 2017-08-18: 144 mg via INTRAVENOUS
  Filled 2017-08-18: qty 24

## 2017-08-18 MED ORDER — SODIUM CHLORIDE 0.9% FLUSH
10.0000 mL | INTRAVENOUS | Status: DC | PRN
  Administered 2017-08-18: 10 mL via INTRAVENOUS
  Filled 2017-08-18: qty 10

## 2017-08-18 MED ORDER — FAMOTIDINE IN NACL 20-0.9 MG/50ML-% IV SOLN
20.0000 mg | Freq: Once | INTRAVENOUS | Status: AC
  Administered 2017-08-18: 20 mg via INTRAVENOUS
  Filled 2017-08-18: qty 50

## 2017-08-18 NOTE — Progress Notes (Signed)
Patient is here for treatment and follow up, she mentions some headaches and ear pain that does not last long. She mentions some numbness and tingling in her feet. Her fingernails are black and the thumbs are tender.

## 2017-08-25 ENCOUNTER — Inpatient Hospital Stay: Payer: BLUE CROSS/BLUE SHIELD | Attending: Oncology

## 2017-08-25 ENCOUNTER — Inpatient Hospital Stay: Payer: BLUE CROSS/BLUE SHIELD

## 2017-08-25 VITALS — BP 115/77 | HR 98 | Temp 97.9°F | Resp 20

## 2017-08-25 DIAGNOSIS — C50411 Malignant neoplasm of upper-outer quadrant of right female breast: Secondary | ICD-10-CM | POA: Diagnosis present

## 2017-08-25 DIAGNOSIS — D649 Anemia, unspecified: Secondary | ICD-10-CM | POA: Diagnosis not present

## 2017-08-25 DIAGNOSIS — R509 Fever, unspecified: Secondary | ICD-10-CM | POA: Diagnosis present

## 2017-08-25 DIAGNOSIS — R000 Tachycardia, unspecified: Secondary | ICD-10-CM | POA: Diagnosis not present

## 2017-08-25 DIAGNOSIS — E876 Hypokalemia: Secondary | ICD-10-CM | POA: Diagnosis not present

## 2017-08-25 DIAGNOSIS — Z5111 Encounter for antineoplastic chemotherapy: Secondary | ICD-10-CM | POA: Diagnosis not present

## 2017-08-25 DIAGNOSIS — Z170 Estrogen receptor positive status [ER+]: Secondary | ICD-10-CM | POA: Diagnosis not present

## 2017-08-25 LAB — CBC WITH DIFFERENTIAL/PLATELET
BASOS ABS: 0.1 10*3/uL (ref 0–0.1)
BASOS PCT: 1 %
EOS ABS: 0.1 10*3/uL (ref 0–0.7)
Eosinophils Relative: 2 %
HCT: 32.8 % — ABNORMAL LOW (ref 35.0–47.0)
HEMOGLOBIN: 10.6 g/dL — AB (ref 12.0–16.0)
LYMPHS ABS: 0.8 10*3/uL — AB (ref 1.0–3.6)
Lymphocytes Relative: 14 %
MCH: 26.7 pg (ref 26.0–34.0)
MCHC: 32.2 g/dL (ref 32.0–36.0)
MCV: 82.7 fL (ref 80.0–100.0)
Monocytes Absolute: 0.5 10*3/uL (ref 0.2–0.9)
Monocytes Relative: 9 %
NEUTROS PCT: 74 %
Neutro Abs: 4.2 10*3/uL (ref 1.4–6.5)
PLATELETS: 298 10*3/uL (ref 150–440)
RBC: 3.97 MIL/uL (ref 3.80–5.20)
RDW: 16.2 % — ABNORMAL HIGH (ref 11.5–14.5)
WBC: 5.7 10*3/uL (ref 3.6–11.0)

## 2017-08-25 LAB — COMPREHENSIVE METABOLIC PANEL
ALT: 15 U/L (ref 14–54)
AST: 22 U/L (ref 15–41)
Albumin: 3.7 g/dL (ref 3.5–5.0)
Alkaline Phosphatase: 93 U/L (ref 38–126)
Anion gap: 8 (ref 5–15)
BILIRUBIN TOTAL: 0.3 mg/dL (ref 0.3–1.2)
BUN: 12 mg/dL (ref 6–20)
CHLORIDE: 101 mmol/L (ref 101–111)
CO2: 29 mmol/L (ref 22–32)
CREATININE: 0.83 mg/dL (ref 0.44–1.00)
Calcium: 8.9 mg/dL (ref 8.9–10.3)
Glucose, Bld: 121 mg/dL — ABNORMAL HIGH (ref 65–99)
Potassium: 3.5 mmol/L (ref 3.5–5.1)
Sodium: 138 mmol/L (ref 135–145)
TOTAL PROTEIN: 6.7 g/dL (ref 6.5–8.1)

## 2017-08-25 MED ORDER — SODIUM CHLORIDE 0.9 % IV SOLN: Freq: Once | INTRAVENOUS | Status: DC

## 2017-08-25 MED ORDER — DIPHENHYDRAMINE HCL 50 MG/ML IJ SOLN
25.0000 mg | Freq: Once | INTRAMUSCULAR | Status: AC
  Administered 2017-08-25: 25 mg via INTRAVENOUS
  Filled 2017-08-25: qty 1

## 2017-08-25 MED ORDER — SODIUM CHLORIDE 0.9% FLUSH
10.0000 mL | INTRAVENOUS | Status: DC | PRN
  Filled 2017-08-25: qty 10

## 2017-08-25 MED ORDER — DEXAMETHASONE SODIUM PHOSPHATE 10 MG/ML IJ SOLN
10.0000 mg | Freq: Once | INTRAMUSCULAR | Status: AC
  Administered 2017-08-25: 10 mg via INTRAVENOUS
  Filled 2017-08-25: qty 1

## 2017-08-25 MED ORDER — SODIUM CHLORIDE 0.9 % IV SOLN
72.0000 mg/m2 | Freq: Once | INTRAVENOUS | Status: AC
  Administered 2017-08-25: 144 mg via INTRAVENOUS
  Filled 2017-08-25: qty 24

## 2017-08-25 MED ORDER — FAMOTIDINE IN NACL 20-0.9 MG/50ML-% IV SOLN
20.0000 mg | Freq: Once | INTRAVENOUS | Status: AC
  Administered 2017-08-25: 20 mg via INTRAVENOUS
  Filled 2017-08-25: qty 50

## 2017-08-25 MED ORDER — HEPARIN SOD (PORK) LOCK FLUSH 100 UNIT/ML IV SOLN
500.0000 [IU] | Freq: Once | INTRAVENOUS | Status: AC | PRN
  Administered 2017-08-25: 500 [IU]
  Filled 2017-08-25: qty 5

## 2017-08-25 MED ORDER — SODIUM CHLORIDE 0.9 % IV SOLN
Freq: Once | INTRAVENOUS | Status: AC
  Administered 2017-08-25: 11:00:00 via INTRAVENOUS
  Filled 2017-08-25: qty 1000

## 2017-08-25 MED ORDER — ONDANSETRON HCL 4 MG/2ML IJ SOLN
8.0000 mg | Freq: Once | INTRAMUSCULAR | Status: AC
  Administered 2017-08-25: 8 mg via INTRAVENOUS
  Filled 2017-08-25: qty 4

## 2017-08-28 NOTE — Progress Notes (Signed)
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  INTERVAL HISTORY: Patient returns to clinic today for evaluation and consideration of cycle 11 of 12 of weekly Taxol.  She has a mild peripheral neuropathy, but this has improved since dose reducing her Taxol.  She does not complain of insomnia today.  She has noticed increased weakness and fatigue, but still remains active.  She has no other neurologic complaints. She continues to have right hand pain secondary to fracture. She has a good appetite and denies weight loss. She has no chest pain or shortness of breath. She denies any nausea, vomiting, constipation, or diarrhea. She has no urinary complaints. Patient offers no further specific complaints today.  REVIEW OF SYSTEMS:   Review of Systems  Constitutional: Positive for malaise/fatigue. Negative for fever and weight loss.  Respiratory: Negative.  Negative for cough and shortness of breath.   Cardiovascular: Negative.  Negative for chest pain and leg swelling.  Gastrointestinal: Negative.  Negative for abdominal pain.  Genitourinary: Negative.   Musculoskeletal: Positive for joint pain.  Skin: Negative.  Negative for itching and rash.  Neurological: Positive for tingling, sensory change and weakness.  Psychiatric/Behavioral: The patient is nervous/anxious. The patient does not have insomnia.     As per HPI. Otherwise, a complete review of systems is negative.  PAST MEDICAL HISTORY: Past Medical History:  Diagnosis Date  . Arthritis   . Cancer ([place])   . GERD (gastroesophageal reflux disease)    NO MEDS  .  Heart murmur   . Hypertension   . Migraines   . Thumb fracture    LEFT-WEARING BRACE    PAST SURGICAL HISTORY: Past Surgical History:  Procedure Laterality Date  . BREAST BIOPSY Right 03/19/2017   US guided biopsy of right breast and right lymph node  . CESAREAN SECTION    . FRACTURE SURGERY     left hand  . PILONIDAL CYST EXCISION  1980'S  . PORT-A-CATH REMOVAL N/A 04/27/2017   Procedure: REMOVAL OF PORT A CATH & insertion PORT A CATH LEFT INTERNAL JUGULAR;  Surgeon: Herbert Pun, MD;  Location: [facility] ORS;  Service: General;  Laterality: N/A;  . PORTACATH PLACEMENT N/A 04/15/2017   Procedure: INSERTION PORT-A-CATH;  Surgeon: Herbert Pun, MD;  Location: [facility] ORS;  Service: General;  Laterality: N/A;    FAMILY HISTORY: Family History  Problem Relation Age of Onset  . Breast cancer Maternal Grandmother   . Hypertension Maternal Grandmother   . Angina Mother   . Lung cancer Father   . Diabetes Father   . Prostate cancer Maternal Uncle   . Dementia Maternal Grandfather   . Congestive Heart Failure Maternal Grandfather   . Heart disease Paternal Grandmother     ADVANCED DIRECTIVES (Y/N):  N  HEALTH MAINTENANCE: Social History   Tobacco Use  . Smoking status: Former Smoker    Packs/day: 2.00    Years: 18.00    Pack years: 36.00    Types: Cigarettes    Last attempt to quit: 06/01/2017    Years since quitting: 0.2  . Smokeless tobacco: Never Used  . Tobacco comment: quit 4 months ago  Substance Use Topics  . Alcohol  use: No  . Drug use: No     Colonoscopy:  PAP:  Bone density:  Lipid panel:  Allergies  Allergen Reactions  . Hydrocodone-Acetaminophen Itching and Rash  . Penicillins Swelling, Other (See Comments) and Rash    Throat swelling Has patient had a PCN reaction causing immediate rash, facial/tongue/throat swelling, SOB or lightheadedness with hypotension: Yes Has patient had a PCN reaction causing severe rash involving mucus membranes  or skin necrosis: No Has patient had a PCN reaction that required hospitalization: No Has patient had a PCN reaction occurring within the last 10 years: No If all of the above answers are "NO", then may proceed with Cephalosporin use.     Current Outpatient Medications  Medication Sig Dispense Refill  . acetaminophen (TYLENOL) 500 MG tablet Take 1,000 mg by mouth every 8 (eight) hours as needed for mild pain.    . ALPRAZolam (XANAX) 0.25 MG tablet Take 1 tablet (0.25 mg total) by mouth at bedtime as needed for anxiety. 30 tablet 1  . bisoprolol-hydrochlorothiazide (ZIAC) 5-6.25 MG tablet TAKE 1 TABLET BY MOUTH ONCE DAILY-AM    . buPROPion (WELLBUTRIN SR) 200 MG 12 hr tablet Take 200 mg by mouth 2 (two) times daily.    . diclofenac sodium (VOLTAREN) 1 % GEL APPLY 2 GRAMS TO AFFECTED AREA UP TO 4 TIMES A DAY AS NEEDED  0  . lidocaine-prilocaine (EMLA) cream Apply to affected area once 30 g 3  . loratadine (CLARITIN) 10 MG tablet Take 10 mg by mouth daily as needed for allergies.    . ondansetron (ZOFRAN) 8 MG tablet Take 1 tablet (8 mg total) by mouth 2 (two) times daily as needed. 60 tablet 2  . prochlorperazine (COMPAZINE) 10 MG tablet Take 1 tablet (10 mg total) by mouth every 6 (six) hours as needed (Nausea or vomiting). 60 tablet 2  . promethazine (PHENERGAN) 25 MG suppository Place 1 suppository (25 mg total) rectally every 6 (six) hours as needed for nausea or vomiting. 30 each 0  . traMADol (ULTRAM) 50 MG tablet Take 50 mg by mouth daily.      No current facility-administered medications for this visit.    Facility-Administered Medications Ordered in Other Visits  Medication Dose Route Frequency Provider Last Rate Last Dose  . sodium chloride flush (NS) 0.9 % injection 10 mL  10 mL Intracatheter PRN Finnegan, Timothy J, MD   10 mL at 07/15/17 0855    OBJECTIVE: Vitals:   09/01/17 1001  BP: 135/82  Pulse: (!) 112  Temp: 98.8 F (37.1 C)     Body mass index is 32.92 kg/m.     ECOG FS:0 - Asymptomatic  General: Well-developed, well-nourished, no acute distress. Eyes: Pink conjunctiva, anicteric sclera. Breasts: Patient requested exam be deferred today. Lungs: Clear to auscultation bilaterally. Heart: Regular rate and rhythm. No rubs, murmurs, or gallops. Abdomen: Soft, nontender, nondistended. No organomegaly noted, normoactive bowel sounds. Musculoskeletal: No edema, cyanosis, or clubbing. Neuro: Alert, answering all questions appropriately. Cranial nerves grossly intact. Skin: No rashes or petechiae noted. Psych: Normal affect.   LAB RESULTS:  Lab Results  Component Value Date   NA 138 09/01/2017   K 3.6 09/01/2017   CL 104 09/01/2017   CO2 26 09/01/2017   GLUCOSE 93 09/01/2017   BUN 11 09/01/2017   CREATININE 0.71 09/01/2017   CALCIUM 9.1 09/01/2017   PROT 6.9 09/01/2017   ALBUMIN 3.6 09/01/2017   AST 20 09/01/2017   ALT 15 09/01/2017   ALKPHOS   91 09/01/2017   BILITOT 0.4 09/01/2017   GFRNONAA >60 09/01/2017   GFRAA >60 09/01/2017    Lab Results  Component Value Date   WBC 6.4 09/01/2017   NEUTROABS 4.8 09/01/2017   HGB 10.6 (L) 09/01/2017   HCT 32.6 (L) 09/01/2017   MCV 81.2 09/01/2017   PLT 281 09/01/2017     STUDIES: No results found.  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  PLAN:    1. 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 After lengthy discussion with the patient, she is highly interested in breast conservation and wants to minimize the amount of surgery.  Patient completed 4 cycles of neoadjuvant Adriamycin and Cytoxan on June 09, 2017. Once patient completes her chemotherapy, she will require lumpectomy followed by XRT as well as an aromatase inhibitor for 5 years.  CT scan from April 13, 2017 did not reveal any metastatic disease.  Proceed with cycle 11 of 12 of weekly Taxol today.  Return to clinic in 1 week for laboratory work and consideration of cycle 12.  Patient has been instructed to keep her previously scheduled follow-up with surgery.  Return to clinic the first week of April, postoperatively, for further evaluation and additional treatment planning.  2. Right hand fracture: Continue monitoring treatment per orthopedics.  Proceed with injections for pain as needed.  Case discussed with Dr. Grandville Silos.   3. Neutropenia: Resolved. Secondary to chemotherapy. 4.  Pruritic skin: Patient does not complain of this today.  Recommended OTC topical ointments as needed. 5.  Anemia: Mild, monitor. 6.  Insomnia/anxiety: Continue Xanax as needed. 7.  Neuropathy: Dose reduce Taxol as above.  Patient expressed understanding and was in agreement with this plan. She also understands that She can call clinic at any time with any questions, concerns, or complaints.   Cancer Staging Primary cancer of upper outer quadrant of right female breast [facility]) Staging form: Breast, AJCC 8th Edition - Clinical stage from 03/27/2017: Stage IB (cT1c, cN1, cM0, G2, ER: Positive, PR: Positive, HER2: Negative) - Signed by Lloyd Huger, MD on 03/27/2017   Lloyd Huger, MD   09/04/2017 12:09 PM

## 2017-09-01 ENCOUNTER — Inpatient Hospital Stay: Payer: BLUE CROSS/BLUE SHIELD | Admitting: Oncology

## 2017-09-01 ENCOUNTER — Inpatient Hospital Stay: Payer: BLUE CROSS/BLUE SHIELD

## 2017-09-01 ENCOUNTER — Other Ambulatory Visit: Payer: Self-pay

## 2017-09-01 ENCOUNTER — Encounter: Payer: Self-pay | Admitting: Oncology

## 2017-09-01 VITALS — BP 135/82 | HR 112 | Temp 98.8°F | Wt 191.8 lb

## 2017-09-01 DIAGNOSIS — C50411 Malignant neoplasm of upper-outer quadrant of right female breast: Secondary | ICD-10-CM

## 2017-09-01 DIAGNOSIS — Z170 Estrogen receptor positive status [ER+]: Secondary | ICD-10-CM

## 2017-09-01 DIAGNOSIS — D649 Anemia, unspecified: Secondary | ICD-10-CM | POA: Diagnosis not present

## 2017-09-01 DIAGNOSIS — Z5111 Encounter for antineoplastic chemotherapy: Secondary | ICD-10-CM | POA: Diagnosis not present

## 2017-09-01 LAB — COMPREHENSIVE METABOLIC PANEL
ALT: 15 U/L (ref 14–54)
ANION GAP: 8 (ref 5–15)
AST: 20 U/L (ref 15–41)
Albumin: 3.6 g/dL (ref 3.5–5.0)
Alkaline Phosphatase: 91 U/L (ref 38–126)
BILIRUBIN TOTAL: 0.4 mg/dL (ref 0.3–1.2)
BUN: 11 mg/dL (ref 6–20)
CO2: 26 mmol/L (ref 22–32)
Calcium: 9.1 mg/dL (ref 8.9–10.3)
Chloride: 104 mmol/L (ref 101–111)
Creatinine, Ser: 0.71 mg/dL (ref 0.44–1.00)
GFR calc Af Amer: >60 mL/min (ref >60)
Glucose, Bld: 93 mg/dL (ref 65–99)
Potassium: 3.6 mmol/L (ref 3.5–5.1)
Sodium: 138 mmol/L (ref 135–145)
TOTAL PROTEIN: 6.9 g/dL (ref 6.5–8.1)

## 2017-09-01 LAB — CBC WITH DIFFERENTIAL/PLATELET
BASOS ABS: 0.1 10*3/uL (ref 0–0.1)
BASOS PCT: 1 %
EOS PCT: 1 %
Eosinophils Absolute: 0.1 10*3/uL (ref 0–0.7)
HCT: 32.6 % — ABNORMAL LOW (ref 35.0–47.0)
Hemoglobin: 10.6 g/dL — ABNORMAL LOW (ref 12.0–16.0)
LYMPHS PCT: 13 %
Lymphs Abs: 0.8 10*3/uL — ABNORMAL LOW (ref 1.0–3.6)
MCH: 26.4 pg (ref 26.0–34.0)
MCHC: 32.4 g/dL (ref 32.0–36.0)
MCV: 81.2 fL (ref 80.0–100.0)
Monocytes Absolute: 0.7 10*3/uL (ref 0.2–0.9)
Monocytes Relative: 11 %
Neutro Abs: 4.8 10*3/uL (ref 1.4–6.5)
Neutrophils Relative %: 74 %
PLATELETS: 281 10*3/uL (ref 150–440)
RBC: 4.02 MIL/uL (ref 3.80–5.20)
RDW: 15.7 % — ABNORMAL HIGH (ref 11.5–14.5)
WBC: 6.4 10*3/uL (ref 3.6–11.0)

## 2017-09-01 MED ORDER — HEPARIN SOD (PORK) LOCK FLUSH 100 UNIT/ML IV SOLN
500.0000 [IU] | Freq: Once | INTRAVENOUS | Status: AC | PRN
  Administered 2017-09-01: 500 [IU]
  Filled 2017-09-01: qty 5

## 2017-09-01 MED ORDER — DIPHENHYDRAMINE HCL 50 MG/ML IJ SOLN
25.0000 mg | Freq: Once | INTRAMUSCULAR | Status: AC
  Administered 2017-09-01: 25 mg via INTRAVENOUS
  Filled 2017-09-01: qty 1

## 2017-09-01 MED ORDER — SODIUM CHLORIDE 0.9 % IV SOLN
Freq: Once | INTRAVENOUS | Status: AC
  Administered 2017-09-01: 11:00:00 via INTRAVENOUS
  Filled 2017-09-01: qty 1000

## 2017-09-01 MED ORDER — SODIUM CHLORIDE 0.9 % IV SOLN
72.0000 mg/m2 | Freq: Once | INTRAVENOUS | Status: AC
  Administered 2017-09-01: 144 mg via INTRAVENOUS
  Filled 2017-09-01: qty 24

## 2017-09-01 MED ORDER — FAMOTIDINE IN NACL 20-0.9 MG/50ML-% IV SOLN
20.0000 mg | Freq: Once | INTRAVENOUS | Status: AC
  Administered 2017-09-01: 20 mg via INTRAVENOUS
  Filled 2017-09-01: qty 50

## 2017-09-01 MED ORDER — DEXAMETHASONE SODIUM PHOSPHATE 10 MG/ML IJ SOLN
10.0000 mg | Freq: Once | INTRAMUSCULAR | Status: AC
  Administered 2017-09-01: 10 mg via INTRAVENOUS
  Filled 2017-09-01: qty 1

## 2017-09-01 MED ORDER — SODIUM CHLORIDE 0.9% FLUSH
10.0000 mL | INTRAVENOUS | Status: DC | PRN
  Administered 2017-09-01: 10 mL
  Filled 2017-09-01: qty 10

## 2017-09-08 ENCOUNTER — Inpatient Hospital Stay: Payer: BLUE CROSS/BLUE SHIELD

## 2017-09-08 VITALS — BP 121/80 | HR 98 | Temp 99.6°F | Resp 18 | Wt 192.2 lb

## 2017-09-08 DIAGNOSIS — C50411 Malignant neoplasm of upper-outer quadrant of right female breast: Secondary | ICD-10-CM

## 2017-09-08 DIAGNOSIS — Z5111 Encounter for antineoplastic chemotherapy: Secondary | ICD-10-CM | POA: Diagnosis not present

## 2017-09-08 LAB — COMPREHENSIVE METABOLIC PANEL
ALT: 13 U/L — AB (ref 14–54)
AST: 18 U/L (ref 15–41)
Albumin: 3.4 g/dL — ABNORMAL LOW (ref 3.5–5.0)
Alkaline Phosphatase: 89 U/L (ref 38–126)
Anion gap: 9 (ref 5–15)
BUN: 8 mg/dL (ref 6–20)
CHLORIDE: 101 mmol/L (ref 101–111)
CO2: 25 mmol/L (ref 22–32)
CREATININE: 0.67 mg/dL (ref 0.44–1.00)
Calcium: 9.1 mg/dL (ref 8.9–10.3)
GFR calc Af Amer: >60 mL/min (ref >60)
GLUCOSE: 104 mg/dL — AB (ref 65–99)
Potassium: 3.6 mmol/L (ref 3.5–5.1)
SODIUM: 135 mmol/L (ref 135–145)
Total Bilirubin: 0.3 mg/dL (ref 0.3–1.2)
Total Protein: 6.9 g/dL (ref 6.5–8.1)

## 2017-09-08 LAB — CBC WITH DIFFERENTIAL/PLATELET
Basophils Absolute: 0.1 10*3/uL (ref 0–0.1)
Basophils Relative: 1 %
EOS ABS: 0.1 10*3/uL (ref 0–0.7)
Eosinophils Relative: 2 %
HCT: 32.7 % — ABNORMAL LOW (ref 35.0–47.0)
Hemoglobin: 10.7 g/dL — ABNORMAL LOW (ref 12.0–16.0)
LYMPHS ABS: 0.8 10*3/uL — AB (ref 1.0–3.6)
Lymphocytes Relative: 12 %
MCH: 26.4 pg (ref 26.0–34.0)
MCHC: 32.6 g/dL (ref 32.0–36.0)
MCV: 81 fL (ref 80.0–100.0)
MONO ABS: 0.9 10*3/uL (ref 0.2–0.9)
MONOS PCT: 13 %
Neutro Abs: 5.3 10*3/uL (ref 1.4–6.5)
Neutrophils Relative %: 72 %
PLATELETS: 335 10*3/uL (ref 150–440)
RBC: 4.04 MIL/uL (ref 3.80–5.20)
RDW: 15.1 % — ABNORMAL HIGH (ref 11.5–14.5)
WBC: 7.2 10*3/uL (ref 3.6–11.0)

## 2017-09-08 MED ORDER — HEPARIN SOD (PORK) LOCK FLUSH 100 UNIT/ML IV SOLN
500.0000 [IU] | Freq: Once | INTRAVENOUS | Status: AC
  Administered 2017-09-08: 500 [IU] via INTRAVENOUS
  Filled 2017-09-08: qty 5

## 2017-09-08 MED ORDER — DIPHENHYDRAMINE HCL 50 MG/ML IJ SOLN
25.0000 mg | Freq: Once | INTRAMUSCULAR | Status: AC
  Administered 2017-09-08: 25 mg via INTRAVENOUS
  Filled 2017-09-08: qty 1

## 2017-09-08 MED ORDER — SODIUM CHLORIDE 0.9 % IV SOLN
72.0000 mg/m2 | Freq: Once | INTRAVENOUS | Status: AC
  Administered 2017-09-08: 144 mg via INTRAVENOUS
  Filled 2017-09-08: qty 24

## 2017-09-08 MED ORDER — FAMOTIDINE IN NACL 20-0.9 MG/50ML-% IV SOLN: 20.0000 mg | Freq: Once | INTRAVENOUS | Status: DC

## 2017-09-08 MED ORDER — SODIUM CHLORIDE 0.9% FLUSH
10.0000 mL | INTRAVENOUS | Status: DC | PRN
  Administered 2017-09-08: 10 mL via INTRAVENOUS
  Filled 2017-09-08: qty 10

## 2017-09-08 MED ORDER — SODIUM CHLORIDE 0.9 % IV SOLN
Freq: Once | INTRAVENOUS | Status: AC
  Administered 2017-09-08: 13:00:00 via INTRAVENOUS
  Filled 2017-09-08: qty 100

## 2017-09-08 MED ORDER — SODIUM CHLORIDE 0.9 % IV SOLN
Freq: Once | INTRAVENOUS | Status: AC
  Administered 2017-09-08: 12:00:00 via INTRAVENOUS
  Filled 2017-09-08: qty 1000

## 2017-09-08 MED ORDER — DEXAMETHASONE SODIUM PHOSPHATE 10 MG/ML IJ SOLN
10.0000 mg | Freq: Once | INTRAMUSCULAR | Status: AC
  Administered 2017-09-08: 10 mg via INTRAVENOUS
  Filled 2017-09-08: qty 1

## 2017-09-08 MED ORDER — ACETAMINOPHEN 325 MG PO TABS
650.0000 mg | ORAL_TABLET | Freq: Once | ORAL | Status: AC
  Administered 2017-09-08: 650 mg via ORAL
  Filled 2017-09-08: qty 2

## 2017-09-08 NOTE — Progress Notes (Signed)
Patient states, "I have been feeling really tired and cold the last couple of days. I want to get my treatment today though." All vital signs reviewed with MD, Dr. Grayland Ormond, via telephone. MD coming to see patient at chair side in infusion. 1125- MD at chairside. Per Dr. Grayland Ormond order: proceed with scheduled Taxol treatment today. Add orders for Tylenol 650mg  PO once and 0.9% Sodium Chloride 1000 ml at 9104ml/hr once now. See MAR.

## 2017-09-15 ENCOUNTER — Other Ambulatory Visit: Payer: Self-pay

## 2017-09-15 ENCOUNTER — Inpatient Hospital Stay (HOSPITAL_BASED_OUTPATIENT_CLINIC_OR_DEPARTMENT_OTHER): Payer: BLUE CROSS/BLUE SHIELD | Admitting: Nurse Practitioner

## 2017-09-15 ENCOUNTER — Inpatient Hospital Stay: Payer: BLUE CROSS/BLUE SHIELD

## 2017-09-15 ENCOUNTER — Other Ambulatory Visit: Payer: Self-pay | Admitting: Nurse Practitioner

## 2017-09-15 VITALS — BP 104/69 | HR 108 | Temp 98.8°F | Wt 187.5 lb

## 2017-09-15 DIAGNOSIS — C50411 Malignant neoplasm of upper-outer quadrant of right female breast: Secondary | ICD-10-CM

## 2017-09-15 DIAGNOSIS — Z5111 Encounter for antineoplastic chemotherapy: Secondary | ICD-10-CM | POA: Diagnosis not present

## 2017-09-15 DIAGNOSIS — R000 Tachycardia, unspecified: Secondary | ICD-10-CM

## 2017-09-15 DIAGNOSIS — R509 Fever, unspecified: Secondary | ICD-10-CM

## 2017-09-15 DIAGNOSIS — E876 Hypokalemia: Secondary | ICD-10-CM | POA: Diagnosis not present

## 2017-09-15 DIAGNOSIS — Z170 Estrogen receptor positive status [ER+]: Secondary | ICD-10-CM

## 2017-09-15 LAB — CBC WITH DIFFERENTIAL/PLATELET
BASOS ABS: 0.2 10*3/uL — AB (ref 0–0.1)
Basophils Relative: 2 %
Eosinophils Absolute: 0.1 10*3/uL (ref 0–0.7)
Eosinophils Relative: 1 %
HEMATOCRIT: 32.5 % — AB (ref 35.0–47.0)
Hemoglobin: 10.6 g/dL — ABNORMAL LOW (ref 12.0–16.0)
LYMPHS PCT: 9 %
Lymphs Abs: 0.7 10*3/uL — ABNORMAL LOW (ref 1.0–3.6)
MCH: 25.8 pg — ABNORMAL LOW (ref 26.0–34.0)
MCHC: 32.5 g/dL (ref 32.0–36.0)
MCV: 79.5 fL — AB (ref 80.0–100.0)
Monocytes Absolute: 0.8 10*3/uL (ref 0.2–0.9)
Monocytes Relative: 10 %
NEUTROS ABS: 6.1 10*3/uL (ref 1.4–6.5)
Neutrophils Relative %: 78 %
PLATELETS: 349 10*3/uL (ref 150–440)
RBC: 4.09 MIL/uL (ref 3.80–5.20)
RDW: 14.6 % — ABNORMAL HIGH (ref 11.5–14.5)
WBC: 7.8 10*3/uL (ref 3.6–11.0)

## 2017-09-15 LAB — COMPREHENSIVE METABOLIC PANEL
ALBUMIN: 3.3 g/dL — AB (ref 3.5–5.0)
ALK PHOS: 82 U/L (ref 38–126)
ALT: 15 U/L (ref 14–54)
AST: 24 U/L (ref 15–41)
Anion gap: 12 (ref 5–15)
BUN: 8 mg/dL (ref 6–20)
CALCIUM: 8.9 mg/dL (ref 8.9–10.3)
CHLORIDE: 99 mmol/L — AB (ref 101–111)
CO2: 24 mmol/L (ref 22–32)
CREATININE: 0.75 mg/dL (ref 0.44–1.00)
GFR calc Af Amer: >60 mL/min (ref >60)
GFR calc non Af Amer: >60 mL/min (ref >60)
GLUCOSE: 143 mg/dL — AB (ref 65–99)
Potassium: 3.3 mmol/L — ABNORMAL LOW (ref 3.5–5.1)
SODIUM: 135 mmol/L (ref 135–145)
Total Bilirubin: 0.3 mg/dL (ref 0.3–1.2)
Total Protein: 7.1 g/dL (ref 6.5–8.1)

## 2017-09-15 LAB — MAGNESIUM: Magnesium: 1.5 mg/dL — ABNORMAL LOW (ref 1.7–2.4)

## 2017-09-15 LAB — INFLUENZA PANEL BY PCR (TYPE A & B)
INFLBPCR: NEGATIVE
Influenza A By PCR: NEGATIVE

## 2017-09-15 MED ORDER — ACETAMINOPHEN 325 MG PO TABS
ORAL_TABLET | ORAL | Status: AC
  Filled 2017-09-15: qty 2

## 2017-09-15 MED ORDER — HEPARIN SOD (PORK) LOCK FLUSH 100 UNIT/ML IV SOLN
500.0000 [IU] | Freq: Once | INTRAVENOUS | Status: AC
  Administered 2017-09-15: 500 [IU] via INTRAVENOUS

## 2017-09-15 MED ORDER — SODIUM CHLORIDE 0.9 % IV SOLN
Freq: Once | INTRAVENOUS | Status: AC
  Administered 2017-09-15: 15:00:00 via INTRAVENOUS
  Filled 2017-09-15: qty 1000

## 2017-09-15 MED ORDER — ACETAMINOPHEN 325 MG PO TABS
650.0000 mg | ORAL_TABLET | Freq: Once | ORAL | Status: AC
  Administered 2017-09-15: 650 mg via ORAL

## 2017-09-15 NOTE — Progress Notes (Signed)
Patient c/o being cold and decrease in appetite.

## 2017-09-15 NOTE — Patient Instructions (Addendum)
Destiny Rogers, I hope you are feeling better after the Tylenol and IV fluids. You can take another dose of Tylenol tonight after 9pm. As we discussed, your Magnesium and Potassium levels are low today. Please pick up an otc magnesium supplement. Also, begin eating potassium rich foods. We will re-check your electrolytes in one week. If your symptoms worsen or your fever is not controlled by Tylenol, please call the clinic or go to the ER as we discussed. Thank you for allowing me to participate in your care. - Maree Ainley   Hypomagnesemia Hypomagnesemia is a condition in which the level of magnesium in the blood is low. Magnesium is a mineral that is found in many foods. It is used in many different processes in the body. Hypomagnesemia can affect every organ in the body. It can cause life-threatening problems. What are the causes? Causes of hypomagnesemia include:  Not getting enough magnesium in your diet.  Malnutrition.  Problems with absorbing magnesium from the intestines.  Dehydration.  Alcohol abuse.  Vomiting.  Severe diarrhea.  Some medicines, including medicines that make you urinate more.  Certain diseases, such as kidney disease, diabetes, and overactive thyroid.  What are the signs or symptoms?  Involuntary shaking or trembling of a body part (tremor).  Confusion.  Muscle weakness.  Sensitivity to light, sound, and touch.  Psychiatric issues, such as depression, irritability, or psychosis.  Sudden tightening of muscles (muscle spasms).  Tingling in the arms and legs.  A feeling of fluttering of the heart. These symptoms are more severe if magnesium levels drop suddenly. How is this diagnosed? To make a diagnosis, your health care provider will do a physical exam and order blood and urine tests. How is this treated? Treatment will depend on the cause and the severity of your condition. It may involve:  A magnesium supplement. This can be taken in pill form. It  can also be given through an IV tube. This is usually done if the condition is severe.  Changes to your diet. You may be directed to eat foods that have a lot of magnesium, such as green leafy vegetables, peas, beans, and nuts.  Eliminating alcohol from your diet.  Follow these instructions at home:  Include foods with magnesium in your diet. Foods that are rich in magnesium include green vegetables, beans, nuts and seeds, and whole grains.  Take medicines only as directed by your health care provider.  Take magnesium supplements if your health care provider instructs you to do that. Take them as directed.  Have your magnesium levels monitored as directed by your health care provider.  When you are active, drink fluids that contain electrolytes.  Keep all follow-up visits as directed by your health care provider. This is important. Contact a health care provider if:  You get worse instead of better.  Your symptoms return. Get help right away if:  Your symptoms are severe. This information is not intended to replace advice given to you by your health care provider. Make sure you discuss any questions you have with your health care provider. Document Released: 04/02/2005 Document Revised: 12/13/2015 Document Reviewed: 02/20/2014 Elsevier Interactive Patient Education  2018 [employer]. Magnesium Hydroxide chewable tablets What is this medicine? MAGNESIUM HYDROXIDE (mag NEE zhum hye DROX ide) is a laxative and an antacid. It is used to treat constipation. It is also used to treat acid indigestion, sour stomach, and heartburn. This medicine may be used for other purposes; ask your health care provider or pharmacist  if you have questions. COMMON BRAND NAME(S): Hardin Negus Laxative, Phillips Milk of Magnesia What should I tell my health care provider before I take this medicine? They need to know if you have any of these conditions: -bowel, intestinal, or stomach disease -change in  bowel habits for more than 14 days -kidney disease -low magnesium diet -nausea, vomiting -stomach pain or blockage -an unusual or allergic reaction to magnesium hydroxide, other medicines, foods, dyes, or preservatives -pregnant or trying to get pregnant -breast-feeding How should I use this medicine? Take this medicine by mouth. Chew it completely before swallowing. Follow the directions on the package or prescription label. After taking this medicine, drink a full glass of water. Take your medicine at regular intervals. Do not take your medicine more often than directed. Talk to your pediatrician regarding the use of this medicine in children. While this medicine may be used in children for selected conditions, precautions do apply. Overdosage: If you think you have taken too much of this medicine contact a poison control center or emergency room at once. NOTE: This medicine is only for you. Do not share this medicine with others. What if I miss a dose? If you miss a dose, take it as soon as you can. If it is almost time for your next dose, take only that dose. Do not take double or extra doses. What may interact with this medicine? -antibiotics -delavirdine -gabapentin -lactulose -medicines for fungal infections like itraconazole and ketoconazole -medicines for osteoporosis like alendronate, etidronate, risedronate and tiludronate -medicines for seizures like ethotoin and phenytoin -methenamine -other magnesium-containing antacids, laxatives or supplements -phenothiazines like chlorpromazine, mesoridazine, prochlorperazine, thioridazine -quinidine -rosuvastatin -sodium polystyrene sulfonate -sotalol -vitamin D This list may not describe all possible interactions. Give your health care provider a list of all the medicines, herbs, non-prescription drugs, or dietary supplements you use. Also tell them if you smoke, drink alcohol, or use illegal drugs. Some items may interact with your  medicine. What should I watch for while using this medicine? Tell your doctor or healthcare professional if your symptoms do not start to get better or if they get worse. Do not treat yourself for constipation with this medicine for more than 1 week. See a doctor if you have black tarry stools, rectal bleeding, or if you feel unusually tired. Do not change to another laxative product without advice. If you are taking other medicines, leave an interval of at least 2 hours before or after taking this medicine. To help reduce constipation, drink several glasses of water a day. What side effects may I notice from receiving this medicine? Side effects that you should report to your doctor or health care professional as soon as possible: -allergic reactions like skin rash, itching or hives, swelling of the face, lips, or tongue -confusion -feeling faint or lightheaded, falls -loss of appetite -nausea, vomiting -rectal bleeding -unusually weak or tired Side effects that usually do not require medical attention (report to your doctor or health care professional if they continue or are bothersome): -chalky taste -diarrhea -stomach cramps This list may not describe all possible side effects. Call your doctor for medical advice about side effects. You may report side effects to FDA at 1-800-FDA-1088. Where should I keep my medicine? Keep out of the reach of children. Store at room temperature between 15 and 30 degrees C (59 and 86 degrees F). Do not freeze. Protect from light and moisture. Throw away any unused medicine after the expiration date. NOTE: This sheet is a  summary. It may not cover all possible information. If you have questions about this medicine, talk to your doctor, pharmacist, or health care provider.  2018 Elsevier/Gold Standard (2008-01-24 14:52:35) Potassium Content of Foods Potassium is a mineral found in many foods and drinks. It helps keep fluids and minerals balanced in your  body and affects how steadily your heart beats. Potassium also helps control your blood pressure and keep your muscles and nervous system healthy. Certain health conditions and medicines may change the balance of potassium in your body. When this happens, you can help balance your level of potassium through the foods that you do or do not eat. Your health care provider or dietitian may recommend an amount of potassium that you should have each day. The following lists of foods provide the amount of potassium (in parentheses) per serving in each item. High in potassium The following foods and beverages have 200 mg or more of potassium per serving:  Apricots, 2 raw or 5 dry (200 mg).  Artichoke, 1 medium (345 mg).  Avocado, raw,  each (245 mg).  Banana, 1 medium (425 mg).  Beans, lima, or baked beans, canned,  cup (280 mg).  Beans, white, canned,  cup (595 mg).  Beef roast, 3 oz (320 mg).  Beef, ground, 3 oz (270 mg).  Beets, raw or cooked,  cup (260 mg).  Bran muffin, 2 oz (300 mg).  Broccoli,  cup (230 mg).  Brussels sprouts,  cup (250 mg).  Cantaloupe,  cup (215 mg).  Cereal, 100% bran,  cup (200-400 mg).  Cheeseburger, single, fast food, 1 each (225-400 mg).  Chicken, 3 oz (220 mg).  Clams, canned, 3 oz (535 mg).  Crab, 3 oz (225 mg).  Dates, 5 each (270 mg).  Dried beans and peas,  cup (300-475 mg).  Figs, dried, 2 each (260 mg).  Fish: halibut, tuna, cod, snapper, 3 oz (480 mg).  Fish: salmon, haddock, swordfish, perch, 3 oz (300 mg).  Fish, tuna, canned 3 oz (200 mg).  [place] fries, fast food, 3 oz (470 mg).  Granola with fruit and nuts,  cup (200 mg).  Grapefruit juice,  cup (200 mg).  Greens, beet,  cup (655 mg).  Honeydew melon,  cup (200 mg).  Kale, raw, 1 cup (300 mg).  Kiwi, 1 medium (240 mg).  Kohlrabi, rutabaga, parsnips,  cup (280 mg).  Lentils,  cup (365 mg).  Mango, 1 each (325 mg).  Milk, chocolate, 1 cup (420  mg).  Milk: nonfat, low-fat, whole, buttermilk, 1 cup (350-380 mg).  Molasses, 1 Tbsp (295 mg).  Mushrooms,  cup (280) mg.  Nectarine, 1 each (275 mg).  Nuts: almonds, peanuts, hazelnuts, [place], cashew, mixed, 1 oz (200 mg).  Nuts, pistachios, 1 oz (295 mg).  Orange, 1 each (240 mg).  Orange juice,  cup (235 mg).  Papaya, medium,  fruit (390 mg).  Peanut butter, chunky, 2 Tbsp (240 mg).  Peanut butter, smooth, 2 Tbsp (210 mg).  Pear, 1 medium (200 mg).  Pomegranate, 1 whole (400 mg).  Pomegranate juice,  cup (215 mg).  Pork, 3 oz (350 mg).  Potato chips, salted, 1 oz (465 mg).  Potato, baked with skin, 1 medium (925 mg).  Potatoes, boiled,  cup (255 mg).  Potatoes, mashed,  cup (330 mg).  Prune juice,  cup (370 mg).  Prunes, 5 each (305 mg).  Pudding, chocolate,  cup (230 mg).  Pumpkin, canned,  cup (250 mg).  Raisins, seedless,  cup (270 mg).  Seeds, sunflower or pumpkin, 1 oz (240 mg).  Soy milk, 1 cup (300 mg).  Spinach,  cup (420 mg).  Spinach, canned,  cup (370 mg).  Sweet potato, baked with skin, 1 medium (450 mg).  Swiss chard,  cup (480 mg).  Tomato or vegetable juice,  cup (275 mg).  Tomato sauce or puree,  cup (400-550 mg).  Tomato, raw, 1 medium (290 mg).  Tomatoes, canned,  cup (200-300 mg).  [place], 3 oz (250 mg).  Wheat germ, 1 oz (250 mg).  Winter squash,  cup (250 mg).  Yogurt, plain or fruited, 6 oz (260-435 mg).  Zucchini,  cup (220 mg).  Moderate in potassium The following foods and beverages have 50-200 mg of potassium per serving:  Apple, 1 each (150 mg).  Apple juice,  cup (150 mg).  Applesauce,  cup (90 mg).  Apricot nectar,  cup (140 mg).  Asparagus, small spears,  cup or 6 spears (155 mg).  Bagel, cinnamon raisin, 1 each (130 mg).  Bagel, egg or plain, 4 in., 1 each (70 mg).  Beans, green,  cup (90 mg).  Beans, yellow,  cup (190 mg).  Beer, regular, 12 oz (100  mg).  Beets, canned,  cup (125 mg).  Blackberries,  cup (115 mg).  Blueberries,  cup (60 mg).  Bread, whole wheat, 1 slice (70 mg).  Broccoli, raw,  cup (145 mg).  Cabbage,  cup (150 mg).  Carrots, cooked or raw,  cup (180 mg).  Cauliflower, raw,  cup (150 mg).  Celery, raw,  cup (155 mg).  Cereal, bran flakes, cup (120-150 mg).  Cheese, cottage,  cup (110 mg).  Cherries, 10 each (150 mg).  Chocolate, 1 oz bar (165 mg).  Coffee, brewed 6 oz (90 mg).  Corn,  cup or 1 ear (195 mg).  Cucumbers,  cup (80 mg).  Egg, large, 1 each (60 mg).  Eggplant,  cup (60 mg).  Endive, raw, cup (80 mg).  English muffin, 1 each (65 mg).  Fish, orange roughy, 3 oz (150 mg).  Frankfurter, beef or pork, 1 each (75 mg).  Fruit cocktail,  cup (115 mg).  Grape juice,  cup (170 mg).  Grapefruit,  fruit (175 mg).  Grapes,  cup (155 mg).  Greens: kale, turnip, collard,  cup (110-150 mg).  Ice cream or frozen yogurt, chocolate,  cup (175 mg).  Ice cream or frozen yogurt, vanilla,  cup (120-150 mg).  Lemons, limes, 1 each (80 mg).  Lettuce, all types, 1 cup (100 mg).  Mixed vegetables,  cup (150 mg).  Mushrooms, raw,  cup (110 mg).  Nuts: walnuts, pecans, or macadamia, 1 oz (125 mg).  Oatmeal,  cup (80 mg).  Okra,  cup (110 mg).  Onions, raw,  cup (120 mg).  Peach, 1 each (185 mg).  Peaches, canned,  cup (120 mg).  Pears, canned,  cup (120 mg).  Peas, green, frozen,  cup (90 mg).  Peppers, green,  cup (130 mg).  Peppers, red,  cup (160 mg).  Pineapple juice,  cup (165 mg).  Pineapple, fresh or canned,  cup (100 mg).  Plums, 1 each (105 mg).  Pudding, vanilla,  cup (150 mg).  Raspberries,  cup (90 mg).  Rhubarb,  cup (115 mg).  Rice, wild,  cup (80 mg).  Shrimp, 3 oz (155 mg).  Spinach, raw, 1 cup (170 mg).  Strawberries,  cup (125 mg).  Summer squash  cup (175-200 mg).  Swiss chard, raw, 1 cup (135  mg).  Tangerines, 1 each (140 mg).  Tea, brewed, 6 oz (65 mg).  Turnips,  cup (140 mg).  Watermelon,  cup (85 mg).  Wine, red, table, 5 oz (180 mg).  Wine, white, table, 5 oz (100 mg).  Low in potassium The following foods and beverages have less than 50 mg of potassium per serving.  Bread, white, 1 slice (30 mg).  Carbonated beverages, 12 oz (less than 5 mg).  Cheese, 1 oz (20-30 mg).  Cranberries,  cup (45 mg).  Cranberry juice cocktail,  cup (20 mg).  Fats and oils, 1 Tbsp (less than 5 mg).  Hummus, 1 Tbsp (32 mg).  Nectar: papaya, mango, or pear,  cup (35 mg).  Rice, white or brown,  cup (50 mg).  Spaghetti or macaroni,  cup cooked (30 mg).  Tortilla, flour or corn, 1 each (50 mg).  Waffle, 4 in., 1 each (50 mg).  Water chestnuts,  cup (40 mg).  This information is not intended to replace advice given to you by your health care provider. Make sure you discuss any questions you have with your health care provider. Document Released: 02/18/2005 Document Revised: 12/13/2015 Document Reviewed: 06/03/2013 Elsevier Interactive Patient Education  [employer].

## 2017-09-15 NOTE — Progress Notes (Signed)
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    Chief complaint/ Reason for visit- fever   Heme/Onc history: Patient last evaluated by primary oncologist, Dr. Grayland Ormond on 09/01/17. Patient has completed 12 cycles of weekly neoadjuvant Taxol chemotherapy.  Given that she was node positive, she did require chemotherapy.  She completed 4 cycles of neoadjuvant Adriamycin and Cytoxan on 06/09/17 followed by 12 cycles of weekly Taxol.  CT scan from 04/13/17 did not reveal any evidence of metastatic disease.  She was referred to Dr. Windell Moment for evaluation and discussion of surgical options. She has previously stated that she is interested in breast conservation. Plan to consider XRT after surgery. Giving hormone positivity, she would benefit from use of an aromatase inhibitor for 5 years.   Today, she states that surgeon, Dr. Windell Moment, has scheduled her for post neoadjuvant diagnostic mammogram and ultrasound.  He has also requested that she return to clinic next week for presurgical discussion, assessment, and scheduling.  Interval history- Destiny Rogers presents with fevers up to 99.6 degrees at home. Was 101.3 when seen at [facility] by Dr. Windell Moment earlier today.  She has had the fever for 4 days.   Symptoms have been unchanged.  Symptoms associated with the fever include chills and fatigue, and patient denies abdominal pain, diarrhea, nausea, otitis symptoms, URI symptoms and urinary tract symptoms. Symptoms are worse at no particular time of day.     Symptoms have been unchanged since that time.  Patient has been sleeping well.  Appetite has been stable. Patient unsure if altered urine output..  She has tried to alleviate the symptoms with rest with no relief.    The patient has cancer and received chemotherapy on 09/08/17. Denies sick contacts other than contacts here at the [facility] for treatments. Denies exposure to tobacco.   Patient was being seen at [facility] clinic today by Dr. Windell Moment for surgical evaluation.  She has completed neoadjuvant chemotherapy and reports developing fever, chills, and malaise since her last treatment last week.  Denies pain.  Chest x-ray and urinalysis were performed extranodal clinic but no blood work.  ECOG FS:1 - Symptomatic but completely ambulatory  Review of systems- Review of Systems  Constitutional: Positive for chills, fever and malaise/fatigue. Negative for diaphoresis.  HENT: Negative for congestion, ear pain, sinus pain and sore throat.   Eyes: Negative for redness.  Respiratory: Negative for cough, sputum production, shortness of breath and wheezing.   Cardiovascular: Negative for chest pain, palpitations and orthopnea.  Gastrointestinal: Negative for abdominal pain, constipation, diarrhea, nausea and vomiting.  Genitourinary: Negative for dysuria, flank pain, frequency and urgency.  Musculoskeletal: Negative for falls, joint pain and myalgias.  Skin: Negative.   Neurological: Positive for weakness. Negative for dizziness and headaches.  Endo/Heme/Allergies: Does not bruise/bleed easily.  Psychiatric/Behavioral: The patient is nervous/anxious.      Current treatment- s/p cycle 12 Taxol (09/08/17)   Allergies  Allergen Reactions  . Hydrocodone-Acetaminophen Itching and Rash  . Penicillins Swelling, Other (See Comments) and Rash    Throat swelling Has patient had a PCN reaction  causing immediate rash, facial/tongue/throat swelling, SOB or lightheadedness with hypotension:  Yes Has patient had a PCN reaction causing severe rash involving mucus membranes or skin necrosis: No Has patient had a PCN reaction that required hospitalization: No Has patient had a PCN reaction occurring within the last 10 years: No If all of the above answers are "NO", then may proceed with Cephalosporin use.     Past Medical History:  Diagnosis Date  . Arthritis   . Cancer ([place])   . GERD (gastroesophageal reflux disease)    NO MEDS  . Heart murmur   . Hypertension   . Migraines   . Thumb fracture    LEFT-WEARING BRACE    Past Surgical History:  Procedure Laterality Date  . BREAST BIOPSY Right 03/19/2017   US guided biopsy of right breast and right lymph node  . CESAREAN SECTION    . FRACTURE SURGERY     left hand  . PILONIDAL CYST EXCISION  1980'S  . PORT-A-CATH REMOVAL N/A 04/27/2017   Procedure: REMOVAL OF PORT A CATH & insertion PORT A CATH LEFT INTERNAL JUGULAR;  Surgeon: Herbert Pun, MD;  Location: [facility] ORS;  Service: General;  Laterality: N/A;  . PORTACATH PLACEMENT N/A 04/15/2017   Procedure: INSERTION PORT-A-CATH;  Surgeon: Herbert Pun, MD;  Location: [facility] ORS;  Service: General;  Laterality: N/A;    Social History   Socioeconomic History  . Marital status: Widowed    Spouse name: Not on file  . Number of children: Not on file  . Years of education: Not on file  . Highest education level: Not on file  Social Needs  . Financial resource strain: Not on file  . Food insecurity - worry: Not on file  . Food insecurity - inability: Not on file  . Transportation needs - medical: Not on file  . Transportation needs - non-medical: Not on file  Occupational History  . Not on file  Tobacco Use  . Smoking status: Former Smoker    Packs/day: 2.00    Years: 18.00    Pack years: 36.00    Types: Cigarettes    Last attempt to quit: 06/01/2017    Years since quitting: 0.2  . Smokeless tobacco: Never Used  . Tobacco comment: quit 4 months ago   Substance and Sexual Activity  . Alcohol use: No  . Drug use: No  . Sexual activity: Not on file  Other Topics Concern  . Not on file  Social History Narrative  . Not on file    Family History  Problem Relation Age of Onset  . Breast cancer Maternal Grandmother   . Hypertension Maternal Grandmother   . Angina Mother   . Lung cancer Father   . Diabetes Father   . Prostate cancer Maternal Uncle   . Dementia Maternal Grandfather   . Congestive Heart Failure Maternal Grandfather   . Heart disease Paternal Grandmother     Current Outpatient Medications:  .  acetaminophen (TYLENOL) 500 MG tablet, Take 1,000 mg by mouth every 8 (eight) hours as needed for mild pain., Disp: , Rfl:  .  ALPRAZolam (XANAX) 0.25 MG tablet, Take 1 tablet (0.25 mg total) by mouth at bedtime as needed for anxiety., Disp: 30 tablet, Rfl: 1 .  bisoprolol-hydrochlorothiazide (ZIAC) 5-6.25 MG tablet, TAKE 1 TABLET BY MOUTH ONCE DAILY-AM, Disp: , Rfl:  .  buPROPion (WELLBUTRIN SR) 200 MG 12 hr tablet, Take 200 mg by mouth 2 (two) times daily., Disp: , Rfl:  .  diclofenac sodium (VOLTAREN) 1 % GEL, APPLY 2 GRAMS TO AFFECTED AREA UP TO 4 TIMES A DAY AS NEEDED, Disp: , Rfl: 0 .  lidocaine-prilocaine (EMLA) cream, Apply to affected area once, Disp: 30 g, Rfl: 3 .  loratadine (CLARITIN) 10 MG tablet, Take 10 mg by mouth daily as needed for allergies., Disp: , Rfl:  .  ondansetron (ZOFRAN) 8 MG tablet, Take 1 tablet (8 mg total) by mouth 2 (two) times daily as needed., Disp: 60 tablet, Rfl: 2 .  prochlorperazine (COMPAZINE) 10 MG tablet, Take 1 tablet (10 mg total) by mouth every 6 (six) hours as needed (Nausea or vomiting)., Disp: 60 tablet, Rfl: 2 .  promethazine (PHENERGAN) 25 MG suppository, Place 1 suppository (25 mg total) rectally every 6 (six) hours as needed for nausea or vomiting., Disp: 30 each, Rfl: 0 .  traMADol (ULTRAM) 50 MG tablet, Take 50 mg by mouth daily. , Disp: , Rfl:  No current facility-administered  medications for this visit.   Facility-Administered Medications Ordered in Other Visits:  .  sodium chloride flush (NS) 0.9 % injection 10 mL, 10 mL, Intracatheter, PRN, Lloyd Huger, MD, 10 mL at 07/15/17 0855  Physical exam:  Vitals:   09/15/17 1412 09/15/17 1600  BP: 136/81 104/69  Pulse: (!) 144 (!) 108  Temp: (!) 101.8 F (38.8 C) 98.8 F (37.1 C)  TempSrc: Tympanic Tympanic  Weight: 187 lb 8 oz (85 kg)    Physical Exam  Constitutional: She is oriented to person, place, and time and well-developed, well-nourished, and in no distress.  Accompanied.  HENT:  Head: Normocephalic and atraumatic.  Right Ear: External ear normal.  Left Ear: External ear normal.  Mouth/Throat: Oropharynx is clear and moist.  Eyes: Conjunctivae are normal. Pupils are equal, round, and reactive to light. Right eye exhibits no discharge. Left eye exhibits no discharge. No scleral icterus.  Neck: Normal range of motion. Neck supple.  Cardiovascular: Regular rhythm. Tachycardia present.  Pulmonary/Chest: Effort normal and breath sounds normal.  Abdominal: Soft. Bowel sounds are normal. She exhibits no distension. There is no tenderness.  Musculoskeletal: Normal range of motion. She exhibits no edema or deformity.  Neurological: She is alert and oriented to person, place, and time.  Skin: Skin is warm and dry.  Psychiatric: Her mood appears anxious.    CMP Latest Ref Rng & Units 09/15/2017  Glucose 65 - 99 mg/dL 143(H)  BUN 6 - 20 mg/dL 8  Creatinine 0.44 - 1.00 mg/dL 0.75  Sodium 135 - 145 mmol/L 135  Potassium 3.5 - 5.1 mmol/L 3.3(L)  Chloride 101 - 111 mmol/L 99(L)  CO2 22 - 32 mmol/L 24  Calcium 8.9 - 10.3 mg/dL 8.9  Total Protein 6.5 - 8.1 g/dL 7.1  Total Bilirubin 0.3 - 1.2 mg/dL 0.3  Alkaline Phos 38 - 126 U/L 82  AST 15 - 41 U/L 24  ALT 14 - 54 U/L 15   CBC Latest Ref Rng & Units 09/15/2017  WBC 3.6 - 11.0 K/uL 7.8  Hemoglobin 12.0 - 16.0 g/dL 10.6(L)  Hematocrit 35.0 - 47.0 %  32.5(L)  Platelets 150 - 440 K/uL 349   UA from [facility] 09/15/2017     CHEST 2 VIEW [facility])  Comparison 04/27/2017 and 04/07/2017 Findings: Left jugular Port-A-Cath is present.  Catheter tip in the lower SVC.  Both lungs are clear.  Heart and mediastinum are within normal limits.  Trachea is midline.  No pleural effusions.  Bone structures are intact.  Impression:  No active cardiopulmonary disease.  Electronically signed By: Markus Daft M.D. On: 09/15/2017 16:13   Assessment and plan- Patient is a 57 y.o. female who presents to Symptom Management Clinic for fever. Patient was referred from [facility] by Dr. Windell Moment.   1.  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 Windell Moment) today for possible breast conserving surgery versus mastectomy with SLNBx vs ALND.  Due to patient's acute illness, more in depth discussions were deferred and surgery requests patient to return to clinic next week for reassessment and pre-op scheduling.  Diagnostic mammogram and breast ultrasound are scheduled by surgery.   2. Fever-patient febrile and tachycardic in clinic. Chest X-ray at [facility] clinic negative. Flu negative. Patient denies specific complaints other than malaise and fatigue. Mild cough w/ sputum. UA at [facility] negative for infection; did show few bacteria and wbcs as well as mucus- pt denies urinary complaints however; discussed findings; if symptoms appear, would treat for UTI.  Etiology- viral URI? UTI? Chemotherapy induced fever? Dehydration?  1L NaCl and Tylenol 631m PO given in clinic. Vital signs improved and normalized. T 98.8 oral, HR 108. Patient requested discharge. Discussed otc tylenol for fever including instructions not to exceed 30058mduring a 24 hour period and to monitor over  the counter medications and pain medications for additional tylenol. Followed up with patient by phone this morning. She reports she continues to feel well and has not had fever.    3. Hypomagnesemia- Mg 1.5 in clinic. May account for some weakness and leg spasms. Likely d/t poor oral intake and malaise since chemo. Discussed use of otc magnesium supplement such as Slo-Mag (magnesium chloride).   4. Hypokalemia- K 3.3 today. Likely r/t poor oral intake and recent chemotherapy. Patient requests to avoid potassium pills. Discussed potassium rich foods and link between low Mg and low K. Provided with educational handout.   RTC in 1 week for repeat CMP and Mag. Follow up with Dr. FIGrayland Ormonds previously scheduled. Will copy Dr. CiWindell Momentn note so he can have patient return to clinic next week for discussions of surgery.   Visit Diagnosis 1. Primary cancer of upper outer quadrant of right [place] breast [place]  2. Fever, unspecified fever cause   3. Hypomagnesemia   4. Hypokalemia    A total of (30) minutes of face-to-face time was spent with this patient with greater than 50% of that time in counseling and care-coordination.  Patient expressed understanding and was in agreement with this plan. She also understands that She can call clinic at any time with any questions, concerns, or complaints.  Thank you for allowing me to participate in the care of this very pleasant patient.   LaBeckey RutterDNP, AGNP-C [phone]) 09/16/17 9:15 AM

## 2017-09-16 ENCOUNTER — Encounter: Payer: Self-pay | Admitting: Nurse Practitioner

## 2017-09-22 ENCOUNTER — Inpatient Hospital Stay: Payer: BLUE CROSS/BLUE SHIELD | Attending: Oncology

## 2017-09-22 DIAGNOSIS — C50411 Malignant neoplasm of upper-outer quadrant of right female breast: Secondary | ICD-10-CM | POA: Diagnosis present

## 2017-09-22 LAB — COMPREHENSIVE METABOLIC PANEL
ALK PHOS: 82 U/L (ref 38–126)
ALT: 21 U/L (ref 14–54)
AST: 25 U/L (ref 15–41)
Albumin: 3.4 g/dL — ABNORMAL LOW (ref 3.5–5.0)
Anion gap: 8 (ref 5–15)
BILIRUBIN TOTAL: 0.2 mg/dL — AB (ref 0.3–1.2)
BUN: 6 mg/dL (ref 6–20)
CALCIUM: 9.5 mg/dL (ref 8.9–10.3)
CO2: 26 mmol/L (ref 22–32)
Chloride: 107 mmol/L (ref 101–111)
Creatinine, Ser: 0.7 mg/dL (ref 0.44–1.00)
GFR calc Af Amer: >60 mL/min (ref >60)
Glucose, Bld: 98 mg/dL (ref 65–99)
POTASSIUM: 3.6 mmol/L (ref 3.5–5.1)
Sodium: 141 mmol/L (ref 135–145)
TOTAL PROTEIN: 7 g/dL (ref 6.5–8.1)

## 2017-09-22 LAB — CBC WITH DIFFERENTIAL/PLATELET
BASOS PCT: 1 %
Basophils Absolute: 0.1 10*3/uL (ref 0–0.1)
EOS ABS: 0.2 10*3/uL (ref 0–0.7)
EOS PCT: 2 %
HCT: 32.8 % — ABNORMAL LOW (ref 35.0–47.0)
Hemoglobin: 10.5 g/dL — ABNORMAL LOW (ref 12.0–16.0)
Lymphocytes Relative: 17 %
Lymphs Abs: 1.7 10*3/uL (ref 1.0–3.6)
MCH: 25.2 pg — ABNORMAL LOW (ref 26.0–34.0)
MCHC: 32.1 g/dL (ref 32.0–36.0)
MCV: 78.5 fL — ABNORMAL LOW (ref 80.0–100.0)
Monocytes Absolute: 1.2 10*3/uL — ABNORMAL HIGH (ref 0.2–0.9)
Monocytes Relative: 12 %
Neutro Abs: 7.2 10*3/uL — ABNORMAL HIGH (ref 1.4–6.5)
Neutrophils Relative %: 68 %
PLATELETS: 450 10*3/uL — AB (ref 150–440)
RBC: 4.18 MIL/uL (ref 3.80–5.20)
RDW: 15.7 % — AB (ref 11.5–14.5)
WBC: 10.5 10*3/uL (ref 3.6–11.0)

## 2017-09-22 LAB — MAGNESIUM: Magnesium: 1.6 mg/dL — ABNORMAL LOW (ref 1.7–2.4)

## 2017-09-28 ENCOUNTER — Other Ambulatory Visit: Payer: Self-pay | Admitting: General Surgery

## 2017-09-28 DIAGNOSIS — C50411 Malignant neoplasm of upper-outer quadrant of right female breast: Secondary | ICD-10-CM

## 2017-09-28 DIAGNOSIS — Z170 Estrogen receptor positive status [ER+]: Principal | ICD-10-CM

## 2017-10-01 ENCOUNTER — Encounter: Payer: Self-pay | Admitting: *Deleted

## 2017-10-01 NOTE — Progress Notes (Signed)
  Oncology Nurse Navigator Documentation  Navigator Location: [facility] Onc (10/01/17 1000)   )Navigator Encounter Type: Telephone (10/01/17 1000)                         Barriers/Navigation Needs: Coordination of Care (10/01/17 1000)   Interventions: Referrals;Coordination of Care (10/01/17 1000)                      Time Spent with Patient: 30 (10/01/17 1000)   Patient called today and complains of numbness and tingling in her fingers and toes.  Asked if there was anything she could do for the discomfort.  I offered acupuncture through our [employer].  She is agreeable.  Approval sent to [place].

## 2017-10-05 ENCOUNTER — Ambulatory Visit
Admission: RE | Admit: 2017-10-05 | Discharge: 2017-10-05 | Disposition: A | Discharge status: Home / Self Care | Payer: BLUE CROSS/BLUE SHIELD | Source: Ambulatory Visit | Attending: General Surgery | Admitting: General Surgery

## 2017-10-05 ENCOUNTER — Other Ambulatory Visit: Payer: Self-pay | Admitting: General Surgery

## 2017-10-05 DIAGNOSIS — C50411 Malignant neoplasm of upper-outer quadrant of right female breast: Secondary | ICD-10-CM

## 2017-10-05 DIAGNOSIS — Z170 Estrogen receptor positive status [ER+]: Principal | ICD-10-CM

## 2017-10-05 HISTORY — DX: Personal history of antineoplastic chemotherapy: Z92.21

## 2017-10-06 ENCOUNTER — Other Ambulatory Visit: Payer: BLUE CROSS/BLUE SHIELD

## 2017-10-07 ENCOUNTER — Other Ambulatory Visit: Payer: Self-pay | Admitting: General Surgery

## 2017-10-07 ENCOUNTER — Other Ambulatory Visit: Payer: Self-pay

## 2017-10-07 ENCOUNTER — Encounter
Admission: RE | Admit: 2017-10-07 | Discharge: 2017-10-07 | Disposition: A | Discharge status: Home / Self Care | Payer: BLUE CROSS/BLUE SHIELD | Source: Ambulatory Visit | Attending: General Surgery | Admitting: General Surgery

## 2017-10-07 ENCOUNTER — Ambulatory Visit: Payer: Self-pay | Admitting: General Surgery

## 2017-10-07 DIAGNOSIS — C50411 Malignant neoplasm of upper-outer quadrant of right female breast: Secondary | ICD-10-CM

## 2017-10-07 DIAGNOSIS — Z170 Estrogen receptor positive status [ER+]: Principal | ICD-10-CM

## 2017-10-07 HISTORY — DX: Anemia, unspecified: D64.9

## 2017-10-07 HISTORY — DX: Dyspnea, unspecified: R06.00

## 2017-10-07 NOTE — Patient Instructions (Signed)
Your procedure is scheduled on: 10-14-17 [facility] Report to [place] (2ND DESK ON RIGHT) @ 7:45 AND THEN YOU WILL GO TO [place]  Remember: Instructions that are not followed completely may result in serious medical risk, up to and including death, or upon the discretion of your surgeon and anesthesiologist your surgery may need to be rescheduled.    _x___ 1. Do not eat food after midnight the night before your procedure. NO GUM OR CANDY AFTER MIDNIGHT.  You may drink clear liquids up to 2 hours before you are scheduled to arrive at the hospital for your procedure.  Do not drink clear liquids within 2 hours of your scheduled arrival to the hospital.  Clear liquids include  --Water or Apple juice without pulp  --Clear carbohydrate beverage such as ClearFast or Gatorade  --Black Coffee or Clear Tea (No milk, no creamers, do not add anything to the coffee or Tea     __x__ 2. No Alcohol for 24 hours before or after surgery.   __x__3. No Smoking or e-cigarettes for 24 prior to surgery.  Do not use any chewable tobacco products for at least 6 hour prior to surgery   ____  4. Bring all medications with you on the day of surgery if instructed.    __x__ 5. Notify your doctor if there is any change in your medical condition     (cold, fever, infections).    x___6. On the morning of surgery brush your teeth with toothpaste and water.  You may rinse your mouth with mouth wash if you wish.  Do not swallow any toothpaste or mouthwash.   Do not wear jewelry, make-up, hairpins, clips or nail polish.  Do not wear lotions, powders, or perfumes. You may wear deodorant.  Do not shave 48 hours prior to surgery. Men may shave face and neck.  Do not bring valuables to the hospital.    [facility] is not responsible for any belongings or valuables.               Contacts, dentures or bridgework may not be worn into surgery.  Leave your suitcase in the car. After surgery  it may be brought to your room.  For patients admitted to the hospital, discharge time is determined by your  treatment team.  _  Patients discharged the day of surgery will not be allowed to drive home.  You will need someone to drive you home and stay with you the night of your procedure.    Please read over the following fact sheets that you were given:   [facility] Preparing for Surgery and or MRSA Information   _x___ TAKE THE FOLLOWING MEDICATION THE MORNING OF SURGERY WITH A SMALL SIP OF WATER. These include:  1. WELLBUTRIN (BUPROPION)  2. XANAX (ALPRAZOLAM)  3.  4.  5.  6.  ____Fleets enema or Magnesium Citrate as directed.   _x___ Use CHG Soap or sage wipes as directed on instruction sheet   ____ Use inhalers on the day of surgery and bring to hospital day of surgery  ____ Stop Metformin and Janumet 2 days prior to surgery.    ____ Take 1/2 of usual insulin dose the night before surgery and none on the morning surgery.   ____ Follow recommendations from Cardiologist, Pulmonologist or PCP regarding stopping Aspirin, Coumadin, Plavix ,Eliquis, Effient, or Pradaxa, and Pletal.  X____Stop Anti-inflammatories such as Advil, Aleve, Ibuprofen, Motrin, Naproxen, Naprosyn, Goodies powders or aspirin  products NOW-OK to take Tylenol    ____ Stop supplements until after surgery.   ____ Bring C-Pap to the hospital.

## 2017-10-07 NOTE — H&P (Signed)
HISTORY OF PRESENT ILLNESS:    Destiny Rogers is a 57 y.o.female patient who comes for follow up of breast cancer. Patient today feeling good. Denies palpitations and shortness of breath. Since last visit, patient has been able to keep hydrated and able to eat properly. No new setback.     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 Patient completed 12 cycles of Taxol Neoadjuvant chemotherapy. Patient discussed with Oncology the post operative plan with radiation therapy and chemotherapy if needed.    PAST MEDICAL HISTORY:      Past Medical History:  Diagnosis Date  . Depression, unspecified   . Hyperlipidemia, unspecified   . Hypertension   . Migraine         PAST SURGICAL HISTORY:        Past Surgical History:  Procedure Laterality Date  . CESAREAN SECTION    . DILATION AND CURETTAGE, DIAGNOSTIC / THERAPEUTIC  2008   and uterine ablation - Dr. Laurey Morale  . TUBAL LIGATION           MEDICATIONS:  EncounterMedications        Outpatient Encounter Medications as of 10/07/2017  Medication Sig Dispense Refill  . acetaminophen (TYLENOL) 500 MG tablet Take 1,000 mg by mouth every 8 (eight) hours as needed    . ALPRAZolam (XANAX) 0.25 MG tablet Take 0.25 mg by mouth nightly as needed  1  . bisoprolol-hydrochlorothiazide (ZIAC) 5-6.25 mg tablet TAKE 1 TABLET BY MOUTH ONCE DAILY. 30 tablet 11  . buPROPion (WELLBUTRIN SR) 200 MG SR tablet Take 1 tablet (200 mg total) by mouth 2 (two) times daily. 60 tablet 5  . diclofenac (VOLTAREN) 1 % topical gel APPLY 2 GRAMS TO AFFECTED AREA UP TO 4 TIMES A DAY AS NEEDED    . lidocaine-prilocaine (EMLA) cream APPLY TO AFFECTED AREA ONCE  3  . loratadine (CLARITIN) 10 mg tablet Take 10 mg by mouth as needed    . ondansetron (ZOFRAN) 8 MG tablet Take 8 mg by mouth 2 (two) times daily as needed    . prochlorperazine (COMPAZINE) 10 MG tablet Take 10  mg by mouth every 6 (six) hours as needed    . promethazine (PHENERGAN) 25 MG suppository Place 25 mg rectally every 6 (six) hours as needed for Nausea    . traMADol (ULTRAM) 50 mg tablet Take 50 mg by mouth once daily     No facility-administered encounter medications on file as of 10/07/2017.        ALLERGIES:   Norco [hydrocodone-acetaminophen] and Penicillins   SOCIAL HISTORY:  Social History          Socioeconomic History  . Marital status: Single    Spouse name: Not on file  . Number of children: Not on file  . Years of education: Not on file  . Highest education level: Not on file  Occupational History  . Not on file  Social Needs  . Financial resource strain: Not on file  . Food insecurity:    Worry: Not on file    Inability: Not on file  . Transportation needs:    Medical: Not on file    Non-medical: Not on file  Tobacco Use  . Smoking status: Former Research scientist (life sciences)  . Smokeless tobacco: Never Used  Substance and Sexual Activity  . Alcohol use: No    Alcohol/week: 0.0 oz  . Drug use: Not on file  . Sexual activity:  Not on file  Other Topics Concern  . Not on file  Social History Narrative  . Not on file      FAMILY HISTORY:       Family History  Problem Relation Age of Onset  . High blood pressure (Hypertension) Mother   . Diabetes type II Father   . Lung cancer Father   . Diabetes type II Sister   . Alzheimer's disease Maternal Grandfather      GENERAL REVIEW OF SYSTEMS:   General ROS: negative for - chills, fatigue, fever, weight gain or weight loss Allergy and Immunology ROS: negative for - hives  Hematological and Lymphatic ROS: negative for - bleeding problems or bruising, negative for palpable nodes Endocrine ROS: negative for - heat or cold intolerance, hair changes Respiratory ROS: negative for - cough, shortness of breath or wheezing Cardiovascular ROS: no chest pain or palpitations GI ROS: negative for nausea,  vomiting, abdominal pain, diarrhea, constipation Musculoskeletal ROS: negative for - joint swelling or muscle pain Neurological ROS: negative for - confusion, syncope. Positive for neuropathy Dermatological ROS: negative for pruritus and rash  PHYSICAL EXAM:     Vitals:   10/07/17 1620  BP: 135/81  Pulse: 110  Temp: 36.4 C (97.6 F)   Estimated body mass index is 30.95 kg/m as calculated from the following:   Height as of this encounter: 165.1 cm (_0 ).   Weight as of this encounter: 84.4 kg (186 lb).   GENERAL: Alert, active, oriented x3  HEENT: Pupils equal reactive to light. Extraocular movements are intact. Sclera clear. Palpebral conjunctiva normal red color.Pharynx clear.  NECK: Supple with no palpable mass and no adenopathy.  LUNGS: Sound clear with no rales rhonchi or wheezes.  HEART: Regular rhythm S1 and S2 without murmur.  ABDOMEN: Soft and depressible, nontender with no palpable mass, no hepatomegaly. Wounds dry and clean.  EXTREMITIES: Well-developed well-nourished symmetrical with no dependent edema.  NEUROLOGICAL: Awake alert oriented, facial expression symmetrical, moving all extremities.      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 Completed neoadjuvant chemotherapy. There was a positive response to chemotherapy as the mass is small as per new breast ultrasound. Will perform needle guided partial mastectomy. Will prepare patient for sentinel lymph node biopsy with possible axillary node dissection.            PLAN:  1. Needle guided partial mastectomy with sentinel lymph node biopsy 2. CBC, CMP 3. Internal Medicine clearance 4. Avoid aspirin 5 days before surgery  Patient and her son verbalized understanding, all questions were answered, and were agreeable with the plan outlined above.   I spent more than 60 minutes on this  encounter >50% orienting patient about the diagnosis, the surgical procedure, possible complications, post op care and coordinating plan of care.   Herbert Pun, MD  Electronically signed by Herbert Pun, MD

## 2017-10-07 NOTE — H&P (View-Only) (Signed)
HISTORY OF PRESENT ILLNESS:    [name] is a 57 y.o.female patient who comes for follow up of breast cancer. Patient today feeling good. Denies palpitations and shortness of breath. Since last visit, patient has been able to keep hydrated and able to eat properly. No new setback.     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 Patient completed 12 cycles of Taxol Neoadjuvant chemotherapy. Patient discussed with Oncology the post operative plan with radiation therapy and chemotherapy if needed.    PAST MEDICAL HISTORY:      Past Medical History:  Diagnosis Date  . Depression, unspecified   . Hyperlipidemia, unspecified   . Hypertension   . Migraine         PAST SURGICAL HISTORY:        Past Surgical History:  Procedure Laterality Date  . CESAREAN SECTION    . DILATION AND CURETTAGE, DIAGNOSTIC / THERAPEUTIC  2008   and uterine ablation - Dr. Rosenow  . TUBAL LIGATION           MEDICATIONS:  EncounterMedications        Outpatient Encounter Medications as of 10/07/2017  Medication Sig Dispense Refill  . acetaminophen (TYLENOL) 500 MG tablet Take 1,000 mg by mouth every 8 (eight) hours as needed    . ALPRAZolam (XANAX) 0.25 MG tablet Take 0.25 mg by mouth nightly as needed  1  . bisoprolol-hydrochlorothiazide (ZIAC) 5-6.25 mg tablet TAKE 1 TABLET BY MOUTH ONCE DAILY. 30 tablet 11  . buPROPion (WELLBUTRIN SR) 200 MG SR tablet Take 1 tablet (200 mg total) by mouth 2 (two) times daily. 60 tablet 5  . diclofenac (VOLTAREN) 1 % topical gel APPLY 2 GRAMS TO AFFECTED AREA UP TO 4 TIMES A DAY AS NEEDED    . lidocaine-prilocaine (EMLA) cream APPLY TO AFFECTED AREA ONCE  3  . loratadine (CLARITIN) 10 mg tablet Take 10 mg by mouth as needed    . ondansetron (ZOFRAN) 8 MG tablet Take 8 mg by mouth 2 (two) times daily as needed    . prochlorperazine (COMPAZINE) 10 MG tablet Take 10  mg by mouth every 6 (six) hours as needed    . promethazine (PHENERGAN) 25 MG suppository Place 25 mg rectally every 6 (six) hours as needed for Nausea    . traMADol (ULTRAM) 50 mg tablet Take 50 mg by mouth once daily     No facility-administered encounter medications on file as of 10/07/2017.        ALLERGIES:   Norco [hydrocodone-acetaminophen] and Penicillins   SOCIAL HISTORY:  Social History          Socioeconomic History  . Marital status: Single    Spouse name: Not on file  . Number of children: Not on file  . Years of education: Not on file  . Highest education level: Not on file  Occupational History  . Not on file  Social Needs  . Financial resource strain: Not on file  . Food insecurity:    Worry: Not on file    Inability: Not on file  . Transportation needs:    Medical: Not on file    Non-medical: Not on file  Tobacco Use  . Smoking status: Former Smoker  . Smokeless tobacco: Never Used  Substance and Sexual Activity  . Alcohol use: No    Alcohol/week: 0.0 oz  . Drug use: Not on file  . Sexual activity:   Not on file  Other Topics Concern  . Not on file  Social History Narrative  . Not on file      FAMILY HISTORY:       Family History  Problem Relation Age of Onset  . High blood pressure (Hypertension) Mother   . Diabetes type II Father   . Lung cancer Father   . Diabetes type II Sister   . Alzheimer's disease Maternal Grandfather      GENERAL REVIEW OF SYSTEMS:   General ROS: negative for - chills, fatigue, fever, weight gain or weight loss Allergy and Immunology ROS: negative for - hives  Hematological and Lymphatic ROS: negative for - bleeding problems or bruising, negative for palpable nodes Endocrine ROS: negative for - heat or cold intolerance, hair changes Respiratory ROS: negative for - cough, shortness of breath or wheezing Cardiovascular ROS: no chest pain or palpitations GI ROS: negative for nausea,  vomiting, abdominal pain, diarrhea, constipation Musculoskeletal ROS: negative for - joint swelling or muscle pain Neurological ROS: negative for - confusion, syncope. Positive for neuropathy Dermatological ROS: negative for pruritus and rash  PHYSICAL EXAM:     Vitals:   10/07/17 1620  BP: 135/81  Pulse: 110  Temp: 36.4 C (97.6 F)   Estimated body mass index is 30.95 kg/m as calculated from the following:   Height as of this encounter: 165.1 cm (5' 5").   Weight as of this encounter: 84.4 kg (186 lb).   GENERAL: Alert, active, oriented x3  HEENT: Pupils equal reactive to light. Extraocular movements are intact. Sclera clear. Palpebral conjunctiva normal red color.Pharynx clear.  NECK: Supple with no palpable mass and no adenopathy.  LUNGS: Sound clear with no rales rhonchi or wheezes.  HEART: Regular rhythm S1 and S2 without murmur.  ABDOMEN: Soft and depressible, nontender with no palpable mass, no hepatomegaly. Wounds dry and clean.  EXTREMITIES: Well-developed well-nourished symmetrical with no dependent edema.  NEUROLOGICAL: Awake alert oriented, facial expression symmetrical, moving all extremities.      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 Completed neoadjuvant chemotherapy. There was a positive response to chemotherapy as the mass is small as per new breast ultrasound. Will perform needle guided partial mastectomy. Will prepare patient for sentinel lymph node biopsy with possible axillary node dissection.            PLAN:  1. Needle guided partial mastectomy with sentinel lymph node biopsy 2. CBC, CMP 3. Internal Medicine clearance 4. Avoid aspirin 5 days before surgery  Patient and her son verbalized understanding, all questions were answered, and were agreeable with the plan outlined above.   I spent more than 60 minutes on this  encounter >50% orienting patient about the diagnosis, the surgical procedure, possible complications, post op care and coordinating plan of care.   Sion Reinders Cintron-Diaz, MD  Electronically signed by Berdene Askari Cintron-Diaz, MD 

## 2017-10-08 ENCOUNTER — Encounter
Admission: RE | Admit: 2017-10-08 | Discharge: 2017-10-08 | Disposition: A | Discharge status: Home / Self Care | Payer: BLUE CROSS/BLUE SHIELD | Source: Ambulatory Visit | Attending: General Surgery | Admitting: General Surgery

## 2017-10-08 DIAGNOSIS — I10 Essential (primary) hypertension: Secondary | ICD-10-CM | POA: Insufficient documentation

## 2017-10-08 DIAGNOSIS — R0602 Shortness of breath: Secondary | ICD-10-CM | POA: Insufficient documentation

## 2017-10-13 ENCOUNTER — Encounter: Payer: Self-pay | Admitting: *Deleted

## 2017-10-13 MED ORDER — VANCOMYCIN HCL IN DEXTROSE 1-5 GM/200ML-% IV SOLN
1000.0000 mg | INTRAVENOUS | Status: AC
  Administered 2017-10-14: 1000 mg via INTRAVENOUS

## 2017-10-14 ENCOUNTER — Ambulatory Visit
Admission: RE | Admit: 2017-10-14 | Discharge: 2017-10-14 | Disposition: A | Discharge status: Home / Self Care | Payer: BLUE CROSS/BLUE SHIELD | Source: Ambulatory Visit | Attending: General Surgery | Admitting: General Surgery

## 2017-10-14 ENCOUNTER — Ambulatory Visit: Payer: BLUE CROSS/BLUE SHIELD | Admitting: Anesthesiology

## 2017-10-14 ENCOUNTER — Encounter
Admission: RE | Disposition: A | Discharge status: Home / Self Care | Payer: Self-pay | Source: Ambulatory Visit | Attending: General Surgery

## 2017-10-14 ENCOUNTER — Encounter: Payer: Self-pay | Admitting: Radiology

## 2017-10-14 ENCOUNTER — Other Ambulatory Visit: Payer: Self-pay

## 2017-10-14 ENCOUNTER — Ambulatory Visit: Payer: BLUE CROSS/BLUE SHIELD

## 2017-10-14 DIAGNOSIS — I10 Essential (primary) hypertension: Secondary | ICD-10-CM | POA: Insufficient documentation

## 2017-10-14 DIAGNOSIS — K219 Gastro-esophageal reflux disease without esophagitis: Secondary | ICD-10-CM | POA: Insufficient documentation

## 2017-10-14 DIAGNOSIS — Z9221 Personal history of antineoplastic chemotherapy: Secondary | ICD-10-CM | POA: Diagnosis not present

## 2017-10-14 DIAGNOSIS — F329 Major depressive disorder, single episode, unspecified: Secondary | ICD-10-CM | POA: Insufficient documentation

## 2017-10-14 DIAGNOSIS — E785 Hyperlipidemia, unspecified: Secondary | ICD-10-CM | POA: Insufficient documentation

## 2017-10-14 DIAGNOSIS — Z79899 Other long term (current) drug therapy: Secondary | ICD-10-CM | POA: Diagnosis not present

## 2017-10-14 DIAGNOSIS — C773 Secondary and unspecified malignant neoplasm of axilla and upper limb lymph nodes: Secondary | ICD-10-CM | POA: Diagnosis not present

## 2017-10-14 DIAGNOSIS — Z170 Estrogen receptor positive status [ER+]: Secondary | ICD-10-CM | POA: Diagnosis not present

## 2017-10-14 DIAGNOSIS — Z885 Allergy status to narcotic agent status: Secondary | ICD-10-CM | POA: Insufficient documentation

## 2017-10-14 DIAGNOSIS — Z87891 Personal history of nicotine dependence: Secondary | ICD-10-CM | POA: Insufficient documentation

## 2017-10-14 DIAGNOSIS — Z880 Allergy status to penicillin: Secondary | ICD-10-CM | POA: Diagnosis not present

## 2017-10-14 DIAGNOSIS — C50911 Malignant neoplasm of unspecified site of right female breast: Secondary | ICD-10-CM | POA: Diagnosis not present

## 2017-10-14 DIAGNOSIS — C50411 Malignant neoplasm of upper-outer quadrant of right female breast: Secondary | ICD-10-CM

## 2017-10-14 DIAGNOSIS — R011 Cardiac murmur, unspecified: Secondary | ICD-10-CM | POA: Insufficient documentation

## 2017-10-14 HISTORY — PX: PARTIAL MASTECTOMY WITH NEEDLE LOCALIZATION: SHX6008

## 2017-10-14 HISTORY — PX: BREAST LUMPECTOMY: SHX2

## 2017-10-14 HISTORY — PX: SENTINEL NODE BIOPSY: SHX6608

## 2017-10-14 SURGERY — PARTIAL MASTECTOMY WITH NEEDLE LOCALIZATION
Anesthesia: General | Site: Breast | Laterality: Right | Wound class: Clean

## 2017-10-14 MED ORDER — PHENYLEPHRINE HCL 10 MG/ML IJ SOLN
INTRAMUSCULAR | Status: DC | PRN
  Administered 2017-10-14: 25 ug/min via INTRAVENOUS

## 2017-10-14 MED ORDER — BISOPROLOL FUMARATE 5 MG PO TABS
5.0000 mg | ORAL_TABLET | Freq: Once | ORAL | Status: AC
  Administered 2017-10-14: 5 mg via ORAL
  Filled 2017-10-14: qty 1

## 2017-10-14 MED ORDER — KETOROLAC TROMETHAMINE 30 MG/ML IJ SOLN
INTRAMUSCULAR | Status: DC | PRN
  Administered 2017-10-14: 30 mg via INTRAVENOUS

## 2017-10-14 MED ORDER — ONDANSETRON HCL 4 MG/2ML IJ SOLN
INTRAMUSCULAR | Status: DC | PRN
  Administered 2017-10-14: 4 mg via INTRAVENOUS

## 2017-10-14 MED ORDER — ACETAMINOPHEN 10 MG/ML IV SOLN
INTRAVENOUS | Status: AC
  Filled 2017-10-14: qty 100

## 2017-10-14 MED ORDER — KETOROLAC TROMETHAMINE 30 MG/ML IJ SOLN
INTRAMUSCULAR | Status: AC
  Filled 2017-10-14: qty 1

## 2017-10-14 MED ORDER — FENTANYL CITRATE (PF) 100 MCG/2ML IJ SOLN
INTRAMUSCULAR | Status: DC | PRN
  Administered 2017-10-14 (×8): 25 ug via INTRAVENOUS

## 2017-10-14 MED ORDER — LIDOCAINE HCL (PF) 2 % IJ SOLN
INTRAMUSCULAR | Status: AC
  Filled 2017-10-14: qty 10

## 2017-10-14 MED ORDER — FENTANYL CITRATE (PF) 100 MCG/2ML IJ SOLN
INTRAMUSCULAR | Status: AC
  Filled 2017-10-14: qty 2

## 2017-10-14 MED ORDER — FENTANYL CITRATE (PF) 100 MCG/2ML IJ SOLN
INTRAMUSCULAR | Status: AC
  Administered 2017-10-14: 25 ug via INTRAVENOUS
  Filled 2017-10-14: qty 2

## 2017-10-14 MED ORDER — PROPOFOL 10 MG/ML IV BOLUS
INTRAVENOUS | Status: DC | PRN
  Administered 2017-10-14: 160 mg via INTRAVENOUS

## 2017-10-14 MED ORDER — SUCCINYLCHOLINE CHLORIDE 20 MG/ML IJ SOLN
INTRAMUSCULAR | Status: AC
  Filled 2017-10-14: qty 1

## 2017-10-14 MED ORDER — ONDANSETRON HCL 4 MG/2ML IJ SOLN
INTRAMUSCULAR | Status: AC
  Filled 2017-10-14: qty 2

## 2017-10-14 MED ORDER — FAMOTIDINE 20 MG PO TABS
20.0000 mg | ORAL_TABLET | Freq: Once | ORAL | Status: AC
  Administered 2017-10-14: 20 mg via ORAL

## 2017-10-14 MED ORDER — PROPOFOL 500 MG/50ML IV EMUL
INTRAVENOUS | Status: AC
  Filled 2017-10-14: qty 50

## 2017-10-14 MED ORDER — TECHNETIUM TC 99M SULFUR COLLOID FILTERED
0.9720 | Freq: Once | INTRAVENOUS | Status: AC | PRN
  Administered 2017-10-14: 0.972 via INTRADERMAL

## 2017-10-14 MED ORDER — LACTATED RINGERS IV SOLN
INTRAVENOUS | Status: DC
  Administered 2017-10-14 (×2): via INTRAVENOUS

## 2017-10-14 MED ORDER — ACETAMINOPHEN 10 MG/ML IV SOLN
INTRAVENOUS | Status: DC | PRN
  Administered 2017-10-14: 1000 mg via INTRAVENOUS

## 2017-10-14 MED ORDER — KETAMINE HCL 50 MG/ML IJ SOLN
INTRAMUSCULAR | Status: AC
  Filled 2017-10-14: qty 10

## 2017-10-14 MED ORDER — MIDAZOLAM HCL 2 MG/2ML IJ SOLN
INTRAMUSCULAR | Status: AC
Start: 2017-10-14 — End: ?
  Filled 2017-10-14: qty 2

## 2017-10-14 MED ORDER — FAMOTIDINE 20 MG PO TABS
ORAL_TABLET | ORAL | Status: AC
  Administered 2017-10-14: 20 mg via ORAL
  Filled 2017-10-14: qty 1

## 2017-10-14 MED ORDER — MIDAZOLAM HCL 2 MG/2ML IJ SOLN
INTRAMUSCULAR | Status: DC | PRN
  Administered 2017-10-14: 2 mg via INTRAVENOUS

## 2017-10-14 MED ORDER — KETAMINE HCL 50 MG/ML IJ SOLN
INTRAMUSCULAR | Status: DC | PRN
  Administered 2017-10-14 (×2): 10 mg via INTRAMUSCULAR
  Administered 2017-10-14: 15 mg via INTRAMUSCULAR
  Administered 2017-10-14: 5 mg via INTRAMUSCULAR

## 2017-10-14 MED ORDER — TRAMADOL HCL 50 MG PO TABS: 50.0000 mg | ORAL_TABLET | Freq: Four times a day (QID) | ORAL | 0 refills | Status: AC | PRN

## 2017-10-14 MED ORDER — FENTANYL CITRATE (PF) 100 MCG/2ML IJ SOLN
25.0000 ug | INTRAMUSCULAR | Status: DC | PRN
  Administered 2017-10-14 (×4): 25 ug via INTRAVENOUS

## 2017-10-14 MED ORDER — LIDOCAINE HCL (CARDIAC) 20 MG/ML IV SOLN
INTRAVENOUS | Status: DC | PRN
  Administered 2017-10-14: 60 mg via INTRAVENOUS

## 2017-10-14 MED ORDER — PROPOFOL 500 MG/50ML IV EMUL
INTRAVENOUS | Status: DC | PRN
  Administered 2017-10-14: 25 ug/kg/min via INTRAVENOUS

## 2017-10-14 MED ORDER — BUPIVACAINE-EPINEPHRINE (PF) 0.5% -1:200000 IJ SOLN
INTRAMUSCULAR | Status: DC | PRN
  Administered 2017-10-14: 20 mL

## 2017-10-14 MED ORDER — DEXAMETHASONE SODIUM PHOSPHATE 10 MG/ML IJ SOLN
INTRAMUSCULAR | Status: AC
  Filled 2017-10-14: qty 1

## 2017-10-14 MED ORDER — BUPIVACAINE-EPINEPHRINE (PF) 0.5% -1:200000 IJ SOLN
INTRAMUSCULAR | Status: AC
  Filled 2017-10-14: qty 30

## 2017-10-14 MED ORDER — GLYCOPYRROLATE 0.2 MG/ML IJ SOLN
INTRAMUSCULAR | Status: DC | PRN
  Administered 2017-10-14 (×2): 0.1 mg via INTRAVENOUS

## 2017-10-14 MED ORDER — DEXAMETHASONE SODIUM PHOSPHATE 10 MG/ML IJ SOLN
INTRAMUSCULAR | Status: DC | PRN
  Administered 2017-10-14: 5 mg via INTRAVENOUS

## 2017-10-14 MED ORDER — PHENYLEPHRINE HCL 10 MG/ML IJ SOLN
INTRAMUSCULAR | Status: DC | PRN
  Administered 2017-10-14: 100 ug via INTRAVENOUS
  Administered 2017-10-14 (×2): 50 ug via INTRAVENOUS
  Administered 2017-10-14 (×2): 100 ug via INTRAVENOUS

## 2017-10-14 MED ORDER — ONDANSETRON HCL 4 MG/2ML IJ SOLN
4.0000 mg | Freq: Once | INTRAMUSCULAR | Status: AC | PRN
  Administered 2017-10-14: 4 mg via INTRAVENOUS

## 2017-10-14 SURGICAL SUPPLY — 45 items
BINDER BREAST LRG (GAUZE/BANDAGES/DRESSINGS) IMPLANT
BINDER BREAST MEDIUM (GAUZE/BANDAGES/DRESSINGS) IMPLANT
BINDER BREAST XLRG (GAUZE/BANDAGES/DRESSINGS) IMPLANT
BINDER BREAST XXLRG (GAUZE/BANDAGES/DRESSINGS) IMPLANT
BLADE SURG 15 STRL SS SAFETY (BLADE) ×4 IMPLANT
CANISTER SUCT 1200ML W/VALVE (MISCELLANEOUS) ×2 IMPLANT
CHLORAPREP W/TINT 26ML (MISCELLANEOUS) ×2 IMPLANT
CNTNR SPEC 2.5X3XGRAD LEK (MISCELLANEOUS) ×1
CONT SPEC 4OZ STER OR WHT (MISCELLANEOUS) ×1
CONTAINER SPEC 2.5X3XGRAD LEK (MISCELLANEOUS) ×1 IMPLANT
COVER PROBE FLX POLY STRL (MISCELLANEOUS) ×2 IMPLANT
DEVICE DUBIN SPECIMEN MAMMOGRA (MISCELLANEOUS) ×2 IMPLANT
DRAPE LAPAROTOMY TRNSV 106X77 (MISCELLANEOUS) ×2 IMPLANT
DRSG TELFA 3X8 NADH (GAUZE/BANDAGES/DRESSINGS) ×2 IMPLANT
ELECT CAUTERY BLADE TIP 2.5 (TIP) ×2
ELECT REM PT RETURN 9FT ADLT (ELECTROSURGICAL) ×2
ELECTRODE CAUTERY BLDE TIP 2.5 (TIP) ×1 IMPLANT
ELECTRODE REM PT RTRN 9FT ADLT (ELECTROSURGICAL) ×1 IMPLANT
GAUZE SPONGE 4X4 12PLY STRL (GAUZE/BANDAGES/DRESSINGS) ×2 IMPLANT
GLOVE BIO SURGEON STRL SZ 6.5 (GLOVE) ×2 IMPLANT
GOWN STRL REUS W/ TWL LRG LVL3 (GOWN DISPOSABLE) ×2 IMPLANT
GOWN STRL REUS W/TWL LRG LVL3 (GOWN DISPOSABLE) ×2
KIT TURNOVER KIT A (KITS) ×2 IMPLANT
LABEL OR SOLS (LABEL) ×2 IMPLANT
MARGIN MAP 10MM (MISCELLANEOUS) ×2 IMPLANT
NEEDLE HYPO 22GX1.5 SAFETY (NEEDLE) ×2 IMPLANT
NEEDLE HYPO 25X1 1.5 SAFETY (NEEDLE) ×4 IMPLANT
PACK BASIN MINOR ARMC (MISCELLANEOUS) ×2 IMPLANT
SLEVE PROBE SENORX GAMMA FIND (MISCELLANEOUS) ×2 IMPLANT
SUT ETHILON 3-0 FS-10 30 BLK (SUTURE) ×2
SUT MNCRL AB 4-0 PS2 18 (SUTURE) ×4 IMPLANT
SUT SILK 2 0 (SUTURE) ×1
SUT SILK 2 0 SH (SUTURE) ×2 IMPLANT
SUT SILK 2-0 18XBRD TIE 12 (SUTURE) ×1 IMPLANT
SUT VIC AB 2-0 CT1 27 (SUTURE) ×3
SUT VIC AB 2-0 CT1 TAPERPNT 27 (SUTURE) ×3 IMPLANT
SUT VIC AB 3-0 SH 27 (SUTURE) ×2
SUT VIC AB 3-0 SH 27X BRD (SUTURE) ×2 IMPLANT
SUT VIC AB 4-0 FS2 27 (SUTURE) ×4 IMPLANT
SUT VICRYL+ 3-0 144IN (SUTURE) ×2 IMPLANT
SUTURE EHLN 3-0 FS-10 30 BLK (SUTURE) ×1 IMPLANT
SYR 10ML LL (SYRINGE) ×2 IMPLANT
SYR BULB IRRIG 60ML STRL (SYRINGE) ×2 IMPLANT
SYR CONTROL 10ML (SYRINGE) ×2 IMPLANT
WATER STERILE IRR 1000ML POUR (IV SOLUTION) ×2 IMPLANT

## 2017-10-14 NOTE — Anesthesia Procedure Notes (Signed)
Procedure Name: LMA Insertion Date/Time: 10/14/2017 12:54 PM Performed by: Molli Barrows, MD Pre-anesthesia Checklist: Patient identified, Emergency Drugs available, Suction available, Patient being monitored and Timeout performed Patient Re-evaluated:Patient Re-evaluated prior to induction Oxygen Delivery Method: Circle system utilized Preoxygenation: Pre-oxygenation with 100% oxygen Induction Type: IV induction Ventilation: Mask ventilation without difficulty LMA: LMA inserted LMA Size: 4.0 Number of attempts: 1 Airway Equipment and Method: Patient positioned with wedge pillow Placement Confirmation: positive ETCO2 and breath sounds checked- equal and bilateral Tube secured with: Tape Dental Injury: Teeth and Oropharynx as per pre-operative assessment

## 2017-10-14 NOTE — Transfer of Care (Signed)
Immediate Anesthesia Transfer of Care Note  Patient: Destiny Rogers  Procedure(s) Performed: PARTIAL MASTECTOMY WITH NEEDLE LOCALIZATION (Right Breast) SENTINEL NODE BIOPSY (Right Breast)  Patient Location: PACU  Anesthesia Type:General  Level of Consciousness: sedated  Airway & Oxygen Therapy: Patient Spontanous Breathing and Patient connected to face mask oxygen  Post-op Assessment: Report given to RN and Post -op Vital signs reviewed and stable  Post vital signs: Reviewed and stable  Last Vitals:  Vitals Value Taken Time  BP 118/69 10/14/2017  3:06 PM  Temp 36.6 C 10/14/2017  3:06 PM  Pulse 73 10/14/2017  3:09 PM  Resp 14 10/14/2017  3:09 PM  SpO2 100 % 10/14/2017  3:09 PM  Vitals shown include unvalidated device data.  Last Pain:  Vitals:   10/14/17 1506  TempSrc:   PainSc: Asleep         Complications: No apparent anesthesia complications

## 2017-10-14 NOTE — Anesthesia Post-op Follow-up Note (Signed)
Anesthesia QCDR form completed.        

## 2017-10-14 NOTE — Op Note (Signed)
Preoperative diagnosis: Right breast carcinoma.  Postoperative diagnosis: Right breast carcinoma.   Procedure: Right needle-localized breast lumpectomy.                       Right Axillary Sentinel Lymph node biopsy  Anesthesia: GETA  Surgeon: Dr. Windell Moment  Wound Classification: Clean  Indications: Patient is a 57 y.o. female with a nonpalpable right breast mass noted on mammography with core biopsy demonstrating invasive mammary carcinoma. Axillary ultrasound demonstrated one suspicious lymph node which was biopsied and showed metastatic breast cancer. Patient requires needle-localized lumpectomy for treatment with sentinel lymph node biopsy.   Findings: 1. Specimen mammography shows marker and wire on specimen 2. Pathology call refers gross examination of margins was widely negative 3. No other palpable mass or lymph node identified.   Description of procedure: Preoperative needle localization of the right breast mass and right axillary lymph node was performed by radiology. In the nuclear medicine suite, the subareolar region was injected with Tc-99 sulfur colloid. Localization studies were reviewed. The patient was taken to the operating room and placed supine on the operating table, and after general anesthesia the right chest and axilla were prepped and draped in the usual sterile fashion. A time-out was completed verifying correct patient, procedure, site, positioning, and implant(s) and/or special equipment prior to beginning this procedure.  By comparing the localization studies with the direction and skin entry site of the needle, the probable trajectory and location of the mass was visualized. A circumareolar skin incision was planned in such a way as to minimize the amount of dissection to reach the mass.  The skin incision was made. Flaps were raised and the location of the wire confirmed. The wire was delivered into the wound. A 2-0 silk figure-of-eight stay suture was placed  around the wire and used for retraction. Dissection was then taken down circumferentially, taking care to include the entire localizing needle and a wide margin of grossly normal tissue. The specimen and entire localizing wire were removed. The specimen was oriented and sent to radiology with the localization studies. Confirmation was received that the entire target lesion had been resected. The wound was irrigated. Hemostasis was checked. The wound was closed with interrupted sutures of 3-0 Vicryl and a subcuticular suture of Monocryl 3-0. No attempt was made to close the dead space. A dressing was applied.   A hand-held gamma probe was used to identify the location of the hottest spot in the axilla. Prior to the incision, the counts were 86. An incision was made around the caudal axillary hairline. Dissection was carried down until subdermal facias was advanced following the pre operative placed wire. The probe was placed and again, the point of maximal count was found. Dissection continue until nodule was identified at the end of the wire. The probe was placed in contact with the node and 743 counts were recorded. The node was excised in its entirety. Ex vivo, the node measured 966 counts when placed on the probe. The bed of the node measured 3 counts. No additional hot spots were identified. During the dissection to reach the marked node few normally looking nodes were see and sent to pathology. No count was identified on them. The procedure was terminated. Hemostasis was achieved and the wound closed in layers with deep interrupted 3-0 Vicryl and skin was closed with subcuticular suture of Monocryl 4-0.  The patient tolerated the procedure well and was taken to the postanesthesia care unit in  stable condition.   Specimen: Right Breast mass (Orientation markers used: Cranial, Caudal, Medial, Lateral,Skin, Deep)                    Right Sentinel Lymph node         Right axillary lymph nodes    Complications: None  Estimated Blood Loss: 59mL

## 2017-10-14 NOTE — Anesthesia Preprocedure Evaluation (Signed)
Anesthesia Evaluation  Patient identified by MRN, date of birth, ID band Patient awake    Reviewed: Allergy & Precautions, H&P , NPO status , Patient's Chart, lab work & pertinent test results, reviewed documented beta blocker date and time   Airway Mallampati: III  TM Distance: >3 FB Neck ROM: full    Dental  (+) Teeth Intact   Pulmonary neg pulmonary ROS, shortness of breath, former smoker,    Pulmonary exam normal        Cardiovascular Exercise Tolerance: Good hypertension, On Medications negative cardio ROS Normal cardiovascular exam+ Valvular Problems/Murmurs  Rate:Normal     Neuro/Psych  Headaches, PSYCHIATRIC DISORDERS Depression negative neurological ROS  negative psych ROS   GI/Hepatic negative GI ROS, Neg liver ROS, GERD  ,  Endo/Other  negative endocrine ROS  Renal/GU negative Renal ROS  negative genitourinary   Musculoskeletal   Abdominal   Peds  Hematology negative hematology ROS (+) anemia ,   Anesthesia Other Findings   Reproductive/Obstetrics negative OB ROS                             Anesthesia Physical Anesthesia Plan  ASA: III  Anesthesia Plan: General LMA   Post-op Pain Management:    Induction:   PONV Risk Score and Plan:   Airway Management Planned:   Additional Equipment:   Intra-op Plan:   Post-operative Plan:   Informed Consent: I have reviewed the patients History and Physical, chart, labs and discussed the procedure including the risks, benefits and alternatives for the proposed anesthesia with the patient or authorized representative who has indicated his/her understanding and acceptance.     Plan Discussed with: CRNA  Anesthesia Plan Comments:         Anesthesia Quick Evaluation

## 2017-10-14 NOTE — Interval H&P Note (Signed)
History and Physical Interval Note:  10/14/2017 12:08 PM  Destiny Rogers  has presented today for surgery, with the diagnosis of MALIGNANT NEOPLASM OF RIGHT BREAST  The various methods of treatment have been discussed with the patient and family. After consideration of risks, benefits and other options for treatment, the patient has consented to  Procedure(s): PARTIAL MASTECTOMY WITH NEEDLE LOCALIZATION (Right) SENTINEL NODE BIOPSY (Right) as a surgical intervention .  The patient's history has been reviewed, patient examined, no change in status, stable for surgery.  I have reviewed the patient's chart and labs.  Right breast marked in the pre procedure room as the correct site of surgery. Questions were answered to the patient's satisfaction.     Herbert Pun

## 2017-10-14 NOTE — Discharge Instructions (Signed)
AMBULATORY SURGERY  DISCHARGE INSTRUCTIONS   1) The drugs that you were given will stay in your system until tomorrow so for the next 24 hours you should not:  A) Drive an automobile B) Make any legal decisions C) Drink any alcoholic beverage   2) You may resume regular meals tomorrow.  Today it is better to start with liquids and gradually work up to solid foods.  You may eat anything you prefer, but it is better to start with liquids, then soup and crackers, and gradually work up to solid foods.   3) Please notify your doctor immediately if you have any unusual bleeding, trouble breathing, redness and pain at the surgery site, drainage, fever, or pain not relieved by medication.    4) Additional Instructions:        Please contact your physician with any problems or Same Day Surgery at [phone], Monday through Friday 6 am to 4 pm, or [place] at [facility] number at [phone]. Diet: Resume home heart healthy regular diet.   Activity: No heavy lifting >20 pounds (children, pets, laundry, garbage) or strenuous activity until follow-up with right upper extremity, but light activity and walking are encouraged. Do not drive or drink alcohol if taking narcotic pain medications.  Wound care: May shower with soapy water and pat dry (do not rub incisions), but no baths or submerging incision underwater until follow-up. (no swimming)   Medications: Resume all home medications. For mild to moderate pain: acetaminophen (Tylenol) or ibuprofen (if no kidney disease). Combining Tylenol with alcohol can substantially increase your risk of causing liver disease. Narcotic pain medications, if prescribed, can be used for severe pain, though may cause nausea, constipation, and drowsiness. Do not combine Tylenol and Percocet within a 6 hour period as Percocet contains Tylenol. If you do not need the narcotic pain medication, you do not need to fill the prescription.  Call office  [phone]) at any time if any questions, worsening pain, fevers/chills, bleeding, drainage from incision site, or other concerns.    Breast Biopsy, Care After These instructions give you information about caring for yourself after your procedure. Your doctor may also give you more specific instructions. Call your doctor if you have any problems or questions after your procedure. Follow these instructions at home: Medicines  Take over-the-counter and prescription medicines only as told by your doctor.  Do not drive for 24 hours if you received a sedative.  Do not drink alcohol while taking pain medicine.  Do not drive or use heavy machinery while taking prescription pain medicine. Biopsy Site Care   Follow instructions from your doctor about how to take care of your cut from surgery (incision) or puncture area. Make sure you: ? Wash your hands with soap and water before you change your bandage. If you cannot use soap and water, use hand sanitizer. ? Change any bandages (dressings) as told by your doctor. ? Leave any stitches (sutures), skin glue, or skin tape (adhesive) strips in place. They may need to stay in place for 2 weeks or longer. If tape strips get loose and curl up, you may trim the loose edges. Do not remove tape strips completely unless your doctor says it is okay.  If you have stitches, keep them dry when you take a bath or a shower.  Check your cut or puncture area every day for signs of infection. Check for: ? More redness, swelling, or pain. ? More fluid or blood. ? Warmth. ? Pus or  a bad smell.  Protect the biopsy area. Do not let the area get bumped. Activity  Avoid activities that could pull the biopsy site open. ? Avoid stretching. ? Avoid reaching. ? Avoid exercise. ? Avoid sports. ? Avoid lifting anything that is heavier than 20 pounds   Return to your normal activities as told by your doctor. Ask your doctor what activities are safe for  you. General instructions  Continue your normal diet.  Wear a good support bra for as long as told by your doctor.  Get checked for extra fluid in your body (lymphedema) as often as told by your doctor.  Keep all follow-up visits as told by your doctor. This is important. Contact a health care provider if:  You have more redness, swelling, or pain at the biopsy site.  You have more fluid or blood coming from your biopsy site.  Your biopsy site feels warm to the touch.  You have pus or a bad smell coming from the biopsy site.  Your biopsy site breaks open after the stitches, staples, or skin tape strips have been removed.  You have a rash.  You have a fever. Get help right away if:  You have more bleeding (more than a small spot) from the biopsy site.  You have trouble breathing.  You have red streaks around the biopsy site. This information is not intended to replace advice given to you by your health care provider. Make sure you discuss any questions you have with your health care provider. Document Released: 05/03/2009 Document Revised: 03/13/2016 Document Reviewed: 04/10/2015 Elsevier Interactive Patient Education  2018 [employer].

## 2017-10-14 NOTE — Brief Op Note (Signed)
10/14/2017  3:18 PM  PATIENT:  Destiny Rogers  57 y.o. female  PRE-OPERATIVE DIAGNOSIS:  MALIGNANT NEOPLASM OF RIGHT BREAST  POST-OPERATIVE DIAGNOSIS:  MALIGNANT NEOPLASM OF RIGHT BREAST  PROCEDURE:  Procedure(s): PARTIAL MASTECTOMY WITH NEEDLE LOCALIZATION (Right) SENTINEL NODE BIOPSY (Right)  SURGEON:  Surgeon(s) and Role:    * Herbert Pun, MD - Primary  PHYSICIAN ASSISTANT:   ASSISTANTS: none   ANESTHESIA:   local and general  EBL:  25 mL

## 2017-10-15 ENCOUNTER — Encounter: Payer: Self-pay | Admitting: General Surgery

## 2017-10-15 NOTE — Anesthesia Postprocedure Evaluation (Signed)
Anesthesia Post Note  Patient: Destiny Rogers  Procedure(s) Performed: PARTIAL MASTECTOMY WITH NEEDLE LOCALIZATION (Right Breast) SENTINEL NODE BIOPSY (Right Breast)  Patient location during evaluation: PACU Anesthesia Type: General Level of consciousness: awake and alert Pain management: pain level controlled Vital Signs Assessment: post-procedure vital signs reviewed and stable Respiratory status: spontaneous breathing, nonlabored ventilation, respiratory function stable and patient connected to nasal cannula oxygen Cardiovascular status: blood pressure returned to baseline and stable Postop Assessment: no apparent nausea or vomiting Anesthetic complications: no     Last Vitals:  Vitals:   10/14/17 1617 10/14/17 1639  BP: (!) 142/69 139/72  Pulse: 64 64  Resp: 18 18  Temp: 36.7 C   SpO2: 100% 100%    Last Pain:  Vitals:   10/15/17 0823  TempSrc:   PainSc: 3                  Molli Barrows

## 2017-10-18 LAB — SURGICAL PATHOLOGY

## 2017-10-21 NOTE — Progress Notes (Signed)
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  INTERVAL HISTORY: Patient returns to clinic today for evaluation and discussion of her final pathology results.  She had surgery approximately 1 week ago and continues to have significant breast tenderness, but otherwise feels well.  She does not complain of peripheral neuropathy today.  She does not complain of insomnia today.  She has noticed increased weakness and fatigue, but still remains active.  She has no neurologic complaints. She has a good appetite and denies weight loss. She has no chest pain or shortness of breath. She denies any nausea, vomiting, constipation, or diarrhea. She has no urinary complaints. Patient offers no further specific complaints today.  REVIEW OF SYSTEMS:   Review of Systems  Constitutional: Positive for malaise/fatigue. Negative for fever and weight loss.  Respiratory: Negative.  Negative for cough and shortness of breath.   Cardiovascular: Negative.  Negative for chest pain and leg swelling.  Gastrointestinal: Negative.  Negative for abdominal pain.  Genitourinary: Negative.   Musculoskeletal: Positive for joint pain.  Skin: Negative.  Negative for itching and rash.  Neurological: Positive for weakness. Negative for tingling and sensory change.  Psychiatric/Behavioral: The patient is nervous/anxious. The patient does not have insomnia.     As per HPI. Otherwise, a complete review of systems is negative.  PAST MEDICAL HISTORY: Past Medical History:  Diagnosis Date  . Anemia   . Arthritis   . Cancer ([place])   . Dyspnea    SINCE  STARTING CHEMO WITH EXERTION  . GERD (gastroesophageal reflux disease)    NO MEDS  . Heart murmur    DX IN TEENS-ASYMPTOMATIC  . Hypertension   . Migraines    H/O MIGRAINES  . Personal history of chemotherapy 2018   finished 09/08/17  . Thumb fracture    LEFT-WEARING BRACE    PAST SURGICAL HISTORY: Past Surgical History:  Procedure Laterality Date  . BREAST BIOPSY Right 03/19/2017   US guided biopsy of right breast and right lymph node/ positive  . BREAST LUMPECTOMY Right 10/14/2017  . CESAREAN SECTION    . FRACTURE SURGERY     left hand  . PARTIAL MASTECTOMY WITH NEEDLE LOCALIZATION Right 10/14/2017   Procedure: PARTIAL MASTECTOMY WITH NEEDLE LOCALIZATION;  Surgeon: Herbert Pun, MD;  Location: [facility] ORS;  Service: General;  Laterality: Right;  . PILONIDAL CYST EXCISION  1980'S  . PORT-A-CATH REMOVAL N/A 04/27/2017   Procedure: REMOVAL OF PORT A CATH & insertion PORT A CATH LEFT INTERNAL JUGULAR;  Surgeon: Herbert Pun, MD;  Location: [facility] OR[facility];  Service: General;  Laterality: N/A;  . PORTACATH PLACEMENT N/A 04/15/2017   Procedure: INSERTION PORT-A-CATH;  Surgeon: Herbert Pun, MD;  Location: [facility] ORS;  Service: General;  Laterality: N/A;  . SENTINEL NODE BIOPSY Right 10/14/2017   Procedure: SENTINEL NODE BIOPSY;  Surgeon: Herbert Pun, MD;  Location: [facility] ORS;  Service: General;  Laterality: Right;    FAMILY HISTORY: Family History  Problem Relation Age of Onset  . Breast cancer Maternal Grandmother   . Hypertension Maternal Grandmother   . Angina Mother   . Lung cancer Father   . Diabetes Father   . Prostate cancer Maternal Uncle   .  Dementia Maternal Grandfather   . Congestive Heart Failure Maternal Grandfather   . Heart disease Paternal Grandmother     ADVANCED DIRECTIVES (Y/N):  N  HEALTH MAINTENANCE: Social History   Tobacco Use  . Smoking status: Former Smoker    Packs/day: 2.00    Years: 18.00    Pack years: 36.00     Types: Cigarettes    Last attempt to quit: 05/01/2017    Years since quitting: 0.4  . Smokeless tobacco: Never Used  . Tobacco comment: quit 4 months ago  Substance Use Topics  . Alcohol use: No  . Drug use: No     Colonoscopy:  PAP:  Bone density:  Lipid panel:  Allergies  Allergen Reactions  . Hydrocodone-Acetaminophen Itching and Rash  . Penicillins Swelling, Other (See Comments) and Rash    Throat swelling Has patient had a PCN reaction causing immediate rash, facial/tongue/throat swelling, SOB or lightheadedness with hypotension: Yes Has patient had a PCN reaction causing severe rash involving mucus membranes or skin necrosis: No Has patient had a PCN reaction that required hospitalization: No Has patient had a PCN reaction occurring within the last 10 years: No If all of the above answers are "NO", then may proceed with Cephalosporin use.     Current Outpatient Medications  Medication Sig Dispense Refill  . acetaminophen (TYLENOL) 500 MG tablet Take 1,000 mg by mouth every 8 (eight) hours as needed for mild pain.    Marland Kitchen ALPRAZolam (XANAX) 0.25 MG tablet Take 1 tablet (0.25 mg total) by mouth at bedtime as needed for anxiety. 30 tablet 1  . bisoprolol-hydrochlorothiazide (ZIAC) 5-6.25 MG tablet Take 1 tablet by mouth every morning.     Marland Kitchen buPROPion (WELLBUTRIN SR) 200 MG 12 hr tablet Take 200 mg by mouth 2 (two) times daily.    . Calcium-Magnesium-Zinc (CAL-MAG-ZINC PO) Take 1 tablet by mouth daily.    . diclofenac sodium (VOLTAREN) 1 % GEL Apply 2 g topically 4 (four) times daily as needed (for pain in right thumb).    . ferrous sulfate 325 (65 FE) MG tablet Take 325 mg by mouth daily with breakfast.    . lidocaine-prilocaine (EMLA) cream Apply to affected area once (Patient taking differently: Apply 1 application topically daily as needed (prior to port being accessed.). Apply to affected area once) 30 g 3  . loratadine (CLARITIN) 10 MG tablet Take 10 mg by mouth daily as  needed for allergies.    . Multiple Vitamins-Minerals (ADULT GUMMY PO) Take 2 tablets by mouth daily. One-A-Day Women's Multivitamin Gummies    . ondansetron (ZOFRAN) 8 MG tablet Take 1 tablet (8 mg total) by mouth 2 (two) times daily as needed. (Patient taking differently: Take 8 mg by mouth 2 (two) times daily as needed (for nausea/vomiting.). ) 60 tablet 2  . prochlorperazine (COMPAZINE) 10 MG tablet Take 1 tablet (10 mg total) by mouth every 6 (six) hours as needed (Nausea or vomiting). 60 tablet 2  . promethazine (PHENERGAN) 25 MG suppository Place 1 suppository (25 mg total) rectally every 6 (six) hours as needed for nausea or vomiting. (Patient not taking: Reported on 10/14/2017) 30 each 0  . traMADol (ULTRAM) 50 MG tablet Take 50 mg by mouth daily as needed (for pain.).     Marland Kitchen vitamin C (ASCORBIC ACID) 500 MG tablet Take 500 mg by mouth daily.     Current Facility-Administered Medications  Medication Dose Route Frequency Provider Last Rate Last Dose  . acetaminophen (TYLENOL) tablet 1,000  mg  1,000 mg Oral Once Lloyd Huger, MD       Facility-Administered Medications Ordered in Other Visits  Medication Dose Route Frequency Provider Last Rate Last Dose  . sodium chloride flush (NS) 0.9 % injection 10 mL  10 mL Intracatheter PRN Lloyd Huger, MD   10 mL at 07/15/17 0855    OBJECTIVE: There were no vitals filed for this visit.   There is no height or weight on file to calculate BMI.    ECOG FS:0 - Asymptomatic  General: Well-developed, well-nourished, no acute distress. Eyes: Pink conjunctiva, anicteric sclera. Breasts: Well-healing surgical scar on right breast. Lungs: Clear to auscultation bilaterally. Heart: Regular rate and rhythm. No rubs, murmurs, or gallops. Abdomen: Soft, nontender, nondistended. No organomegaly noted, normoactive bowel sounds. Musculoskeletal: No edema, cyanosis, or clubbing. Neuro: Alert, answering all questions appropriately. Cranial nerves  grossly intact. Skin: No rashes or petechiae noted. Psych: Normal affect.   LAB RESULTS:  Lab Results  Component Value Date   NA 140 10/22/2017   K 3.7 10/22/2017   CL 107 10/22/2017   CO2 25 10/22/2017   GLUCOSE 93 10/22/2017   BUN 10 10/22/2017   CREATININE 0.60 10/22/2017   CALCIUM 9.0 10/22/2017   PROT 6.8 10/22/2017   ALBUMIN 3.6 10/22/2017   AST 20 10/22/2017   ALT 14 10/22/2017   ALKPHOS 86 10/22/2017   BILITOT 0.4 10/22/2017   GFRNONAA >60 10/22/2017   GFRAA >60 10/22/2017    Lab Results  Component Value Date   WBC 9.2 10/22/2017   NEUTROABS 6.1 10/22/2017   HGB 11.1 (L) 10/22/2017   HCT 34.4 (L) 10/22/2017   MCV 74.5 (L) 10/22/2017   PLT 255 10/22/2017     STUDIES: Nm Sentinel Node Injection  Result Date: 10/14/2017 CLINICAL DATA:  Right breast cancer. EXAM: NUCLEAR MEDICINE BREAST LYMPHOSCINTIGRAPHY TECHNIQUE: Intradermal injection of radiopharmaceutical was performed at the 12 o'clock, 3 o'clock, 6 o'clock, and 9 o'clock positions around the right nipple. The patient was then sent to the operating room where the sentinel node(s) were identified and removed by the surgeon. RADIOPHARMACEUTICALS:  Total of 0.972 mCi Millipore-filtered Technetium-52msulfur colloid, injected in four aliquots. IMPRESSION: Uncomplicated intradermal injection of Technetium-927mulfur colloid for purposes of sentinel node identification. Electronically Signed   By: AdMarkus Daft.D.   On: 10/14/2017 08:49   Mm Breast Surgical Specimen  Result Date: 10/14/2017 CLINICAL DATA:  Status post RIGHT lumpectomy following wire localization. EXAM: SPECIMEN RADIOGRAPH OF THE RIGHT BREAST COMPARISON:  Previous exam(s). FINDINGS: Status post excision of the right breast. The wire tip and coil shaped biopsy marker clip are present and are marked for pathology. The findings are discussed with Dr. CiWindell Momentn the operating room, at the time of interpretation. IMPRESSION: Specimen radiograph of the  right breast. Electronically Signed   By: ElNolon Nations.D.   On: 10/14/2017 14:19   Mm Digital Diagnostic Unilat R  Result Date: 10/14/2017 CLINICAL DATA:  Status post wire localization of mass in the 9:30 o'clock location of the RIGHT breast and the RIGHT axilla. EXAM: DIAGNOSTIC RIGHT MAMMOGRAM POST WIRE LOCALIZATION X2 COMPARISON:  Previous exam(s). FINDINGS: Mammographic images were obtained following ULTRASOUND guided localization of mass in the 9:30 o'clock location of the RIGHT breast. The coil shaped clip and Kopan's wire are in place, and marked for Dr. CiWindell MomentFollowing ultrasound-guided localization of mass in the RIGHT axilla, a spiral shaped clip and wire are in place and marked for Dr. CiWindell MomentIMPRESSION:  Kopan's wires are in expected locations following ultrasound-guided localization at both sites. Final Assessment: Post Procedure Mammograms for Marker Placement Electronically Signed   By: Nolon Nations M.D.   On: 10/14/2017 09:12   US Breast Ltd Uni Right Inc Axilla  Result Date: 10/05/2017 CLINICAL DATA:  In August 2018, the patient was diagnosed with invasive mammary carcinoma in the 930 o'clock location of the RIGHT breast and metastatic disease in a RIGHT axillary lymph node. The patient has undergone neoadjuvant treatment. Preoperative evaluation. EXAM: DIGITAL DIAGNOSTIC RIGHT MAMMOGRAM WITH CAD AND TOMO ULTRASOUND RIGHT BREAST COMPARISON:  03/19/2017 and earlier ACR Breast Density Category b: There are scattered areas of fibroglandular density. FINDINGS: There is persistent distortion and mass in the LATERAL portion of the RIGHT breast, marked with a coil shaped tissue marker clip. Mass appears slightly less dense compared to previous exam. No new findings in the RIGHT breast. Mammographic images were processed with CAD. On physical exam, I palpate soft focal thickening in the 9-10 o'clock location of the RIGHT breast. Targeted ultrasound is performed, showing an  irregular hypoechoic mass in the 9:30 o'clock location of the RIGHT breast 6 centimeters from nipple. Mass is associated with acoustic shadowing and distortion at real-time imaging, and measures 0.7 x 0.6 x 0.4 centimeters. (Previously 1.4 x 1.1 x 0.7 centimeters). Tissue marker clip within the mass is well seen sonographically. Evaluation of the RIGHT axilla demonstrates a lymph node with mildly thickened cortex (currently measuring 0.4 centimeters and previously measuring 0.5 centimeters). Within the cortex of the lymph nodes, a tissue marker clip is visible. IMPRESSION: Subjectively and objectively smaller mass in the 9:30 o'clock location of the RIGHT breast. Thickened cortex of the RIGHT axilla appears slightly improved. Tissue marker clips are well seen sonographically. RECOMMENDATION: Treatment plan for known RIGHT breast malignancy. I have discussed the findings and recommendations with the patient. Results were also provided in writing at the conclusion of the visit. If applicable, a reminder letter will be sent to the patient regarding the next appointment. BI-RADS CATEGORY  6: Known biopsy-proven malignancy. Electronically Signed   By: Nolon Nations M.D.   On: 10/05/2017 11:50   Mm Diag Breast Tomo Uni Right  Result Date: 10/05/2017 CLINICAL DATA:  In August 2018, the patient was diagnosed with invasive mammary carcinoma in the 930 o'clock location of the RIGHT breast and metastatic disease in a RIGHT axillary lymph node. The patient has undergone neoadjuvant treatment. Preoperative evaluation. EXAM: DIGITAL DIAGNOSTIC RIGHT MAMMOGRAM WITH CAD AND TOMO ULTRASOUND RIGHT BREAST COMPARISON:  03/19/2017 and earlier ACR Breast Density Category b: There are scattered areas of fibroglandular density. FINDINGS: There is persistent distortion and mass in the LATERAL portion of the RIGHT breast, marked with a coil shaped tissue marker clip. Mass appears slightly less dense compared to previous exam. No new  findings in the RIGHT breast. Mammographic images were processed with CAD. On physical exam, I palpate soft focal thickening in the 9-10 o'clock location of the RIGHT breast. Targeted ultrasound is performed, showing an irregular hypoechoic mass in the 9:30 o'clock location of the RIGHT breast 6 centimeters from nipple. Mass is associated with acoustic shadowing and distortion at real-time imaging, and measures 0.7 x 0.6 x 0.4 centimeters. (Previously 1.4 x 1.1 x 0.7 centimeters). Tissue marker clip within the mass is well seen sonographically. Evaluation of the RIGHT axilla demonstrates a lymph node with mildly thickened cortex (currently measuring 0.4 centimeters and previously measuring 0.5 centimeters). Within the cortex of the lymph nodes,  a tissue marker clip is visible. IMPRESSION: Subjectively and objectively smaller mass in the 9:30 o'clock location of the RIGHT breast. Thickened cortex of the RIGHT axilla appears slightly improved. Tissue marker clips are well seen sonographically. RECOMMENDATION: Treatment plan for known RIGHT breast malignancy. I have discussed the findings and recommendations with the patient. Results were also provided in writing at the conclusion of the visit. If applicable, a reminder letter will be sent to the patient regarding the next appointment. BI-RADS CATEGORY  6: Known biopsy-proven malignancy. Electronically Signed   By: Nolon Nations M.D.   On: 10/05/2017 11:50   [place] Rt Plc Breast Loc Dev   1st Lesion  Inc US Guide  Result Date: 10/14/2017 CLINICAL DATA:  The patient presents for wire localization of lesion in the 9:30 o'clock location of the RIGHT breast and RIGHT axillary lymph node. EXAM: NEEDLE LOCALIZATION OF THE RIGHT BREAST WITH ULTRASOUND GUIDANCE x2 COMPARISON:  Previous exams. FINDINGS: Patient presents for needle localization prior to lumpectomy and targeted axillary dissection. I met with the patient and we discussed the procedure of needle localization  including benefits and alternatives. We discussed the high likelihood of a successful procedure. We discussed the risks of the procedure, including infection, bleeding, tissue injury, and further surgery. Informed, written consent was given. The usual time-out protocol was performed immediately prior to the procedure. Using ultrasound guidance, sterile technique, 1% lidocaine and a 7 centimeter modified Kopans needle, lesion in the 9:30 o'clock location of the RIGHT breast was localized using LATERAL approach. Using the same technique, 7 centimeter modified Kopan's needle was used to localize the RIGHT axillary lymph node marked with a tissue marker clip using a LATERAL approach. The images were marked for Dr. Windell Moment. IMPRESSION: Needle localization of lesions in the RIGHT breast and RIGHT axilla. No apparent complications. Electronically Signed   By: Nolon Nations M.D.   On: 10/14/2017 09:09   [place] Rt Plc Breast Loc Dev   Ea Add Lesion  Inc US Guide  Result Date: 10/14/2017 CLINICAL DATA:  The patient presents for wire localization of lesion in the 9:30 o'clock location of the RIGHT breast and RIGHT axillary lymph node. EXAM: NEEDLE LOCALIZATION OF THE RIGHT BREAST WITH ULTRASOUND GUIDANCE x2 COMPARISON:  Previous exams. FINDINGS: Patient presents for needle localization prior to lumpectomy and targeted axillary dissection. I met with the patient and we discussed the procedure of needle localization including benefits and alternatives. We discussed the high likelihood of a successful procedure. We discussed the risks of the procedure, including infection, bleeding, tissue injury, and further surgery. Informed, written consent was given. The usual time-out protocol was performed immediately prior to the procedure. Using ultrasound guidance, sterile technique, 1% lidocaine and a 7 centimeter modified Kopans needle, lesion in the 9:30 o'clock location of the RIGHT breast was localized using LATERAL  approach. Using the same technique, 7 centimeter modified Kopan's needle was used to localize the RIGHT axillary lymph node marked with a tissue marker clip using a LATERAL approach. The images were marked for Dr. Windell Moment. IMPRESSION: Needle localization of lesions in the RIGHT breast and RIGHT axilla. No apparent complications. Electronically Signed   By: Nolon Nations M.D.   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  PLAN:    1. 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  She then underwent partial mastectomy on October 14, 2017.  Patient noted to have residual disease, therefore will require adjuvant XRT.  She will also require an aromatase inhibitor for a total of 5 years when she completes her XRT.  Patient was given a referral to radiation oncology today.  Return to clinic in 6 weeks for routine evaluation.   2. Right hand fracture: Continue monitoring treatment per orthopedics.  Proceed with injections for pain as needed.  Case previously discussed with Dr. Grandville Silos.   3. Neutropenia: Resolved. Secondary to chemotherapy. 4.  Pruritic skin: Patient does not complain of this today.  Recommended OTC topical ointments as needed. 5.  Anemia: Mild, monitor. 6.  Insomnia/anxiety: Continue Xanax as needed. 7.  Neuropathy: Patient does not complain of this today.  Taxol was previously dose reduced.  Approximately 30 minutes was spent in discussion of which greater than 50% was consultation.  Patient expressed understanding and was in agreement with this plan. She also understands that She can call clinic at any time with any questions, concerns, or complaints.   Cancer Staging Primary cancer of upper  outer quadrant of right female breast [facility]) Staging form: Breast, AJCC 8th Edition - Clinical stage from 03/27/2017: Stage IB (cT1c, cN1, cM0, G2, ER: Positive, PR: Positive, HER2: Negative) - Signed by Lloyd Huger, MD on 03/27/2017 - Pathologic stage from 10/22/2017: No Stage Recommended (ypT1c, pN1a, cM0, G2, ER+, PR+, HER2-) - Signed by Lloyd Huger, MD on 10/22/2017   Lloyd Huger, MD   10/22/2017 11:04 AM

## 2017-10-22 ENCOUNTER — Other Ambulatory Visit: Payer: Self-pay | Admitting: *Deleted

## 2017-10-22 ENCOUNTER — Inpatient Hospital Stay: Payer: BLUE CROSS/BLUE SHIELD | Attending: Oncology

## 2017-10-22 ENCOUNTER — Inpatient Hospital Stay (HOSPITAL_BASED_OUTPATIENT_CLINIC_OR_DEPARTMENT_OTHER): Payer: BLUE CROSS/BLUE SHIELD | Admitting: Oncology

## 2017-10-22 DIAGNOSIS — D649 Anemia, unspecified: Secondary | ICD-10-CM

## 2017-10-22 DIAGNOSIS — Z452 Encounter for adjustment and management of vascular access device: Secondary | ICD-10-CM | POA: Diagnosis not present

## 2017-10-22 DIAGNOSIS — Z170 Estrogen receptor positive status [ER+]: Secondary | ICD-10-CM | POA: Insufficient documentation

## 2017-10-22 DIAGNOSIS — G4700 Insomnia, unspecified: Secondary | ICD-10-CM | POA: Diagnosis not present

## 2017-10-22 DIAGNOSIS — C50411 Malignant neoplasm of upper-outer quadrant of right female breast: Secondary | ICD-10-CM

## 2017-10-22 DIAGNOSIS — F419 Anxiety disorder, unspecified: Secondary | ICD-10-CM

## 2017-10-22 DIAGNOSIS — Z95828 Presence of other vascular implants and grafts: Secondary | ICD-10-CM

## 2017-10-22 LAB — CBC WITH DIFFERENTIAL/PLATELET
BASOS ABS: 0.1 10*3/uL (ref 0–0.1)
Basophils Relative: 1 %
EOS PCT: 4 %
Eosinophils Absolute: 0.3 10*3/uL (ref 0–0.7)
HCT: 34.4 % — ABNORMAL LOW (ref 35.0–47.0)
Hemoglobin: 11.1 g/dL — ABNORMAL LOW (ref 12.0–16.0)
LYMPHS PCT: 19 %
Lymphs Abs: 1.8 10*3/uL (ref 1.0–3.6)
MCH: 24.1 pg — ABNORMAL LOW (ref 26.0–34.0)
MCHC: 32.4 g/dL (ref 32.0–36.0)
MCV: 74.5 fL — AB (ref 80.0–100.0)
Monocytes Absolute: 0.9 10*3/uL (ref 0.2–0.9)
Monocytes Relative: 10 %
Neutro Abs: 6.1 10*3/uL (ref 1.4–6.5)
Neutrophils Relative %: 66 %
PLATELETS: 255 10*3/uL (ref 150–440)
RBC: 4.62 MIL/uL (ref 3.80–5.20)
RDW: 15.5 % — ABNORMAL HIGH (ref 11.5–14.5)
WBC: 9.2 10*3/uL (ref 3.6–11.0)

## 2017-10-22 LAB — COMPREHENSIVE METABOLIC PANEL
ALT: 14 U/L (ref 14–54)
ANION GAP: 8 (ref 5–15)
AST: 20 U/L (ref 15–41)
Albumin: 3.6 g/dL (ref 3.5–5.0)
Alkaline Phosphatase: 86 U/L (ref 38–126)
BILIRUBIN TOTAL: 0.4 mg/dL (ref 0.3–1.2)
BUN: 10 mg/dL (ref 6–20)
CO2: 25 mmol/L (ref 22–32)
Calcium: 9 mg/dL (ref 8.9–10.3)
Chloride: 107 mmol/L (ref 101–111)
Creatinine, Ser: 0.6 mg/dL (ref 0.44–1.00)
GFR calc Af Amer: >60 mL/min (ref >60)
Glucose, Bld: 93 mg/dL (ref 65–99)
POTASSIUM: 3.7 mmol/L (ref 3.5–5.1)
Sodium: 140 mmol/L (ref 135–145)
TOTAL PROTEIN: 6.8 g/dL (ref 6.5–8.1)

## 2017-10-22 MED ORDER — ACETAMINOPHEN 500 MG PO TABS: 1000.0000 mg | ORAL_TABLET | Freq: Once | ORAL | Status: DC

## 2017-10-22 MED ORDER — HEPARIN SOD (PORK) LOCK FLUSH 100 UNIT/ML IV SOLN
500.0000 [IU] | INTRAVENOUS | Status: AC | PRN
  Administered 2017-10-22: 500 [IU]

## 2017-10-22 MED ORDER — SODIUM CHLORIDE 0.9% FLUSH
10.0000 mL | INTRAVENOUS | Status: AC | PRN
  Administered 2017-10-22: 10 mL
  Filled 2017-10-22: qty 10

## 2017-11-05 ENCOUNTER — Ambulatory Visit
Admission: RE | Admit: 2017-11-05 | Discharge: 2017-11-05 | Disposition: A | Discharge status: Home / Self Care | Payer: BLUE CROSS/BLUE SHIELD | Source: Ambulatory Visit | Attending: Radiation Oncology | Admitting: Radiation Oncology

## 2017-11-05 ENCOUNTER — Encounter: Payer: Self-pay | Admitting: Radiation Oncology

## 2017-11-05 ENCOUNTER — Other Ambulatory Visit: Payer: Self-pay

## 2017-11-05 VITALS — BP 144/86 | HR 73 | Temp 98.4°F | Resp 18 | Wt 203.0 lb

## 2017-11-05 DIAGNOSIS — Z9221 Personal history of antineoplastic chemotherapy: Secondary | ICD-10-CM | POA: Insufficient documentation

## 2017-11-05 DIAGNOSIS — Z8042 Family history of malignant neoplasm of prostate: Secondary | ICD-10-CM | POA: Insufficient documentation

## 2017-11-05 DIAGNOSIS — Z79899 Other long term (current) drug therapy: Secondary | ICD-10-CM | POA: Diagnosis not present

## 2017-11-05 DIAGNOSIS — Z8781 Personal history of (healed) traumatic fracture: Secondary | ICD-10-CM | POA: Insufficient documentation

## 2017-11-05 DIAGNOSIS — C50411 Malignant neoplasm of upper-outer quadrant of right female breast: Secondary | ICD-10-CM | POA: Insufficient documentation

## 2017-11-05 DIAGNOSIS — Z803 Family history of malignant neoplasm of breast: Secondary | ICD-10-CM | POA: Insufficient documentation

## 2017-11-05 DIAGNOSIS — K219 Gastro-esophageal reflux disease without esophagitis: Secondary | ICD-10-CM | POA: Insufficient documentation

## 2017-11-05 DIAGNOSIS — Z87891 Personal history of nicotine dependence: Secondary | ICD-10-CM | POA: Diagnosis not present

## 2017-11-05 DIAGNOSIS — R011 Cardiac murmur, unspecified: Secondary | ICD-10-CM | POA: Insufficient documentation

## 2017-11-05 DIAGNOSIS — D649 Anemia, unspecified: Secondary | ICD-10-CM | POA: Diagnosis not present

## 2017-11-05 DIAGNOSIS — Z170 Estrogen receptor positive status [ER+]: Secondary | ICD-10-CM | POA: Diagnosis not present

## 2017-11-05 DIAGNOSIS — Z9011 Acquired absence of right breast and nipple: Secondary | ICD-10-CM | POA: Insufficient documentation

## 2017-11-05 DIAGNOSIS — M129 Arthropathy, unspecified: Secondary | ICD-10-CM | POA: Diagnosis not present

## 2017-11-05 DIAGNOSIS — I10 Essential (primary) hypertension: Secondary | ICD-10-CM | POA: Insufficient documentation

## 2017-11-05 DIAGNOSIS — Z801 Family history of malignant neoplasm of trachea, bronchus and lung: Secondary | ICD-10-CM | POA: Insufficient documentation

## 2017-11-05 NOTE — Consult Note (Signed)
NEW PATIENT EVALUATION  Name: Destiny Rogers  MRN: [id number]  Date:   11/05/2017     DOB: 06/29/1961   This 56 y.o. 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 Breast Cancer    Initial Evaluation    DIAGNOSIS: The encounter diagnosis was Malignant neoplasm of upper-outer quadrant of right breast in female, estrogen receptor positive (HCC).   PREVIOUS INVESTIGATIONS:  ammogram and ultrasound reviewed Pathology reports reviewed Clinical notes reviewed  HPI: patient is a 56-year-old female who originally presented with self discovered right breast mass. This prompted ultrasound and mammography demonstrating a 1.4 x 1.1 cm lesion in the right breast 9:30 position 6 cm from the nipple suspicious for malignancy. There was also seen in 2 lymph nodes in the right axilla with mild increased cortical thickening. She underwent biopsy of both the lesion and axillary lymph nodes both showing invasive mammary carcinomaER/PR positive strongly HER-2/neu not overexpressed. Patient then underwent neoadjuvant chemotherapy using dose dense Cytoxan and Adriamycinas well as Taxol. She did develop some peripheral neuropathy and stated that chemotherapy was difficult.patient then underwent wide local excision and sentinel node biopsy. There was residual invasive mammary carcinoma present measuring 1.2 cm overall grade 2.patient did have one of 3 sentinel lymph nodes positive for 4 mm focus of metastatic mammary carcinoma.patient has tolerated her surgery well.margins were clear at 0.5 mm. She is now seen for ration oncology opinion. She specifically denies breast tenderness cough or bone pain. She is now close to 3 weeks out from  her surgery.  PLANNED TREATMENT REGIMEN: right whole breast and peripheral lymphatic radiation  PAST MEDICAL HISTORY:  has a past medical history of Anemia, Arthritis, Cancer (HCC), Dyspnea, GERD (gastroesophageal reflux disease), Heart murmur, Hypertension, Migraines, Personal history of chemotherapy (2018), and Thumb fracture.    PAST SURGICAL HISTORY:  Past Surgical History:  Procedure Laterality Date  . BREAST BIOPSY Right 03/19/2017   US guided biopsy of right breast and right lymph node/ positive  . BREAST LUMPECTOMY Right 10/14/2017  . CESAREAN SECTION    . FRACTURE SURGERY     left hand  . PARTIAL MASTECTOMY WITH NEEDLE LOCALIZATION Right 10/14/2017   Procedure: PARTIAL MASTECTOMY WITH NEEDLE LOCALIZATION;  Surgeon: Cintron-Diaz, Edgardo, MD;  Location: [facility];  Service: General;  Laterality: Right;  . PILONIDAL CYST EXCISION  1980'S  . PORT-A-CATH REMOVAL N/A 04/27/2017   Procedure: REMOVAL OF PORT A CATH & insertion PORT A CATH LEFT INTERNAL JUGULAR;  Surgeon: Cintron-Diaz, Edgardo, MD;  Location: [facility];  Service: General;  Laterality: N/A;  . PORTACATH PLACEMENT N/A 04/15/2017   Procedure: INSERTION PORT-A-CATH;  Surgeon: Cintron-Diaz, Edgardo, MD;  Location: [facility];  Service: General;  Laterality: N/A;  . SENTINEL NODE BIOPSY Right 10/14/2017   Procedure: SENTINEL NODE BIOPSY;  Surgeon: Cintron-Diaz, Edgardo, MD;  Location: [facility];  Service: General;  Laterality: Right;    FAMILY HISTORY: family history includes Angina in her mother; Breast cancer in her maternal grandmother; Congestive Heart Failure in her maternal grandfather; Dementia in her maternal grandfather; Diabetes in her father; Heart disease in her paternal grandmother; Hypertension in her maternal grandmother; Lung cancer in her father; Prostate cancer in her maternal uncle.  SOCIAL HISTORY:  reports that she quit smoking about   6 months ago. Her smoking use included cigarettes. She has a 36.00 pack-year  smoking history. She has never used smokeless tobacco. She reports that she does not drink alcohol or use drugs.  ALLERGIES: Hydrocodone-acetaminophen and Penicillins  MEDICATIONS:  Current Outpatient Medications  Medication Sig Dispense Refill  . acetaminophen (TYLENOL) 500 MG tablet Take 1,000 mg by mouth every 8 (eight) hours as needed for mild pain.    Marland Kitchen ALPRAZolam (XANAX) 0.25 MG tablet Take 1 tablet (0.25 mg total) by mouth at bedtime as needed for anxiety. 30 tablet 1  . bisoprolol-hydrochlorothiazide (ZIAC) 5-6.25 MG tablet Take 1 tablet by mouth every morning.     Marland Kitchen buPROPion (WELLBUTRIN SR) 200 MG 12 hr tablet Take 200 mg by mouth 2 (two) times daily.    . Calcium-Magnesium-Zinc (CAL-MAG-ZINC PO) Take 1 tablet by mouth daily.    . diclofenac sodium (VOLTAREN) 1 % GEL Apply 2 g topically 4 (four) times daily as needed (for pain in right thumb).    . ferrous sulfate 325 (65 FE) MG tablet Take 325 mg by mouth daily with breakfast.    . lidocaine-prilocaine (EMLA) cream Apply to affected area once (Patient taking differently: Apply 1 application topically daily as needed (prior to port being accessed.). Apply to affected area once) 30 g 3  . loratadine (CLARITIN) 10 MG tablet Take 10 mg by mouth daily as needed for allergies.    . Multiple Vitamins-Minerals (ADULT GUMMY PO) Take 2 tablets by mouth daily. One-A-Day Women's Multivitamin Gummies    . ondansetron (ZOFRAN) 8 MG tablet Take 1 tablet (8 mg total) by mouth 2 (two) times daily as needed. (Patient taking differently: Take 8 mg by mouth 2 (two) times daily as needed (for nausea/vomiting.). ) 60 tablet 2  . prochlorperazine (COMPAZINE) 10 MG tablet Take 1 tablet (10 mg total) by mouth every 6 (six) hours as needed (Nausea or vomiting). 60 tablet 2  . promethazine (PHENERGAN) 25 MG suppository Place 1 suppository (25 mg total) rectally every 6 (six) hours as needed for nausea or vomiting. (Patient not taking: Reported on 10/14/2017) 30  each 0  . traMADol (ULTRAM) 50 MG tablet Take 50 mg by mouth daily as needed (for pain.).     Marland Kitchen vitamin C (ASCORBIC ACID) 500 MG tablet Take 500 mg by mouth daily.     No current facility-administered medications for this encounter.    Facility-Administered Medications Ordered in Other Encounters  Medication Dose Route Frequency Provider Last Rate Last Dose  . sodium chloride flush (NS) 0.9 % injection 10 mL  10 mL Intracatheter PRN Lloyd Huger, MD   10 mL at 07/15/17 0855    ECOG PERFORMANCE STATUS:  0 - Asymptomatic  REVIEW OF SYSTEMS:  Patient denies any weight loss, fatigue, weakness, fever, chills or night sweats. Patient denies any loss of vision, blurred vision. Patient denies any ringing  of the ears or hearing loss. No irregular heartbeat. Patient denies heart murmur or history of fainting. Patient denies any chest pain or pain radiating to her upper extremities. Patient denies any shortness of breath, difficulty breathing at night, cough or hemoptysis. Patient denies any swelling in the lower legs. Patient denies any nausea vomiting, vomiting of blood, or coffee ground material in the vomitus. Patient denies any stomach pain. Patient states has had normal bowel movements no significant constipation or diarrhea. Patient denies any dysuria, hematuria or significant nocturia. Patient denies any problems walking, swelling in the joints or loss of balance. Patient  denies any skin changes, loss of hair or loss of weight. Patient denies any excessive worrying or anxiety or significant depression. Patient denies any problems with insomnia. Patient denies excessive thirst, polyuria, polydipsia. Patient denies any swollen glands, patient denies easy bruising or easy bleeding. Patient denies any recent infections, allergies or URI. Patient "s visual fields have not changed significantly in recent time.    PHYSICAL EXAM: BP (!) 144/86   Pulse 73   Temp 98.4 F (36.9 C)   Resp 18   Wt 203  lb 0.7 oz (92.1 kg)   LMP 04/09/2005 Comment: Tubal Ligation  BMI 33.79 kg/m  Right breast is wide local excision scar which is healed well. No dominant mass or nodularity is noted in either breast in 2 positions examined. She is a small cystic type structure present at the 2:30 position around the nipple areolar complex of the left breast not thought to be clinically significant. No axillary or supraclavicular adenopathy is identified. Well-developed well-nourished patient in NAD. HEENT reveals PERLA, EOMI, discs not visualized.  Oral cavity is clear. No oral mucosal lesions are identified. Neck is clear without evidence of cervical or supraclavicular adenopathy. Lungs are clear to A&P. Cardiac examination is essentially unremarkable with regular rate and rhythm without murmur rub or thrill. Abdomen is benign with no organomegaly or masses noted. Motor sensory and DTR levels are equal and symmetric in the upper and lower extremities. Cranial nerves II through XII are grossly intact. Proprioception is intact. No peripheral adenopathy or edema is identified. No motor or sensory levels are noted. Crude visual fields are within normal range.  LABORATORY DATA: pathology reports reviewed    RADIOLOGY RESULTS:mammogram and ultrasound reviewed   IMPRESSION: stage IIa (T1 N1 M0) invasive mammary carcinoma the right breast status post neoadjuvant chemotherapy followed by wide local excision and sentinel node biopsy with residual carcinoma present.in 57 year old female  PLAN: the present time I have recommended right whole breast and peripheral hepatic radiation. Would treat both areas to 5040 cGy in 28 fractions. Would also boost her scar another 1600 cGy taste on the close margin of 0.5 mm. Risks and benefits of treatment including skin reaction fatigue alteration of blood counts possible inclusion of superficial lung possible lymphedema of her right upper extremity all were discussed in detail with the  patient and her family.There will be extra effort by both professional staff as well as technical staff to coordinate and manage concurrent chemoradiation and ensuing side effects during her treatments.I personally set up and ordered CT simulation for first thing next week. Patient family seem to comprehend my treatment plan well. Patient also will be a candidate for antiestrogen therapy after completion of radiation.  I would like to take this opportunity to thank you for allowing me to participate in the care of your patient.Noreene Filbert, MD

## 2017-11-09 ENCOUNTER — Ambulatory Visit
Admission: RE | Admit: 2017-11-09 | Discharge: 2017-11-09 | Disposition: A | Discharge status: Home / Self Care | Payer: BLUE CROSS/BLUE SHIELD | Source: Ambulatory Visit | Attending: Radiation Oncology | Admitting: Radiation Oncology

## 2017-11-09 DIAGNOSIS — Z510 Encounter for antineoplastic radiation therapy: Secondary | ICD-10-CM | POA: Insufficient documentation

## 2017-11-09 DIAGNOSIS — Z170 Estrogen receptor positive status [ER+]: Secondary | ICD-10-CM | POA: Insufficient documentation

## 2017-11-09 DIAGNOSIS — C50411 Malignant neoplasm of upper-outer quadrant of right female breast: Secondary | ICD-10-CM | POA: Insufficient documentation

## 2017-11-12 DIAGNOSIS — C50411 Malignant neoplasm of upper-outer quadrant of right female breast: Secondary | ICD-10-CM | POA: Diagnosis not present

## 2017-11-16 ENCOUNTER — Other Ambulatory Visit: Payer: Self-pay | Admitting: *Deleted

## 2017-11-16 ENCOUNTER — Ambulatory Visit
Admission: RE | Admit: 2017-11-16 | Discharge: 2017-11-16 | Disposition: A | Discharge status: Home / Self Care | Payer: BLUE CROSS/BLUE SHIELD | Source: Ambulatory Visit | Attending: Radiation Oncology | Admitting: Radiation Oncology

## 2017-11-16 DIAGNOSIS — Z170 Estrogen receptor positive status [ER+]: Principal | ICD-10-CM

## 2017-11-16 DIAGNOSIS — C50411 Malignant neoplasm of upper-outer quadrant of right female breast: Secondary | ICD-10-CM | POA: Diagnosis not present

## 2017-11-17 ENCOUNTER — Ambulatory Visit
Admission: RE | Admit: 2017-11-17 | Discharge: 2017-11-17 | Disposition: A | Discharge status: Home / Self Care | Payer: BLUE CROSS/BLUE SHIELD | Source: Ambulatory Visit | Attending: Radiation Oncology | Admitting: Radiation Oncology

## 2017-11-17 DIAGNOSIS — C50411 Malignant neoplasm of upper-outer quadrant of right female breast: Secondary | ICD-10-CM | POA: Diagnosis not present

## 2017-11-18 ENCOUNTER — Ambulatory Visit
Admission: RE | Admit: 2017-11-18 | Discharge: 2017-11-18 | Disposition: A | Discharge status: Home / Self Care | Payer: Medicaid Other | Source: Ambulatory Visit | Attending: Radiation Oncology | Admitting: Radiation Oncology

## 2017-11-18 DIAGNOSIS — Z510 Encounter for antineoplastic radiation therapy: Secondary | ICD-10-CM | POA: Diagnosis not present

## 2017-11-18 DIAGNOSIS — Z170 Estrogen receptor positive status [ER+]: Secondary | ICD-10-CM | POA: Insufficient documentation

## 2017-11-18 DIAGNOSIS — C50411 Malignant neoplasm of upper-outer quadrant of right female breast: Secondary | ICD-10-CM | POA: Diagnosis present

## 2017-11-19 ENCOUNTER — Ambulatory Visit
Admission: RE | Admit: 2017-11-19 | Discharge: 2017-11-19 | Disposition: A | Discharge status: Home / Self Care | Payer: Medicaid Other | Source: Ambulatory Visit | Attending: Radiation Oncology | Admitting: Radiation Oncology

## 2017-11-19 DIAGNOSIS — C50411 Malignant neoplasm of upper-outer quadrant of right female breast: Secondary | ICD-10-CM | POA: Diagnosis not present

## 2017-11-20 ENCOUNTER — Ambulatory Visit
Admission: RE | Admit: 2017-11-20 | Discharge: 2017-11-20 | Disposition: A | Discharge status: Home / Self Care | Payer: Medicaid Other | Source: Ambulatory Visit | Attending: Radiation Oncology | Admitting: Radiation Oncology

## 2017-11-20 DIAGNOSIS — C50411 Malignant neoplasm of upper-outer quadrant of right female breast: Secondary | ICD-10-CM | POA: Diagnosis not present

## 2017-11-23 ENCOUNTER — Ambulatory Visit
Admission: RE | Admit: 2017-11-23 | Discharge: 2017-11-23 | Disposition: A | Discharge status: Home / Self Care | Payer: Medicaid Other | Source: Ambulatory Visit | Attending: Radiation Oncology | Admitting: Radiation Oncology

## 2017-11-23 DIAGNOSIS — C50411 Malignant neoplasm of upper-outer quadrant of right female breast: Secondary | ICD-10-CM | POA: Diagnosis not present

## 2017-11-24 ENCOUNTER — Ambulatory Visit
Admission: RE | Admit: 2017-11-24 | Discharge: 2017-11-24 | Disposition: A | Discharge status: Home / Self Care | Payer: Medicaid Other | Source: Ambulatory Visit | Attending: Radiation Oncology | Admitting: Radiation Oncology

## 2017-11-24 DIAGNOSIS — C50411 Malignant neoplasm of upper-outer quadrant of right female breast: Secondary | ICD-10-CM | POA: Diagnosis not present

## 2017-11-25 ENCOUNTER — Ambulatory Visit
Admission: RE | Admit: 2017-11-25 | Discharge: 2017-11-25 | Disposition: A | Discharge status: Home / Self Care | Payer: Medicaid Other | Source: Ambulatory Visit | Attending: Radiation Oncology | Admitting: Radiation Oncology

## 2017-11-25 DIAGNOSIS — C50411 Malignant neoplasm of upper-outer quadrant of right female breast: Secondary | ICD-10-CM | POA: Diagnosis not present

## 2017-11-26 ENCOUNTER — Ambulatory Visit
Admission: RE | Admit: 2017-11-26 | Discharge: 2017-11-26 | Disposition: A | Discharge status: Home / Self Care | Payer: Medicaid Other | Source: Ambulatory Visit | Attending: Radiation Oncology | Admitting: Radiation Oncology

## 2017-11-26 DIAGNOSIS — C50411 Malignant neoplasm of upper-outer quadrant of right female breast: Secondary | ICD-10-CM | POA: Diagnosis not present

## 2017-11-27 ENCOUNTER — Ambulatory Visit
Admission: RE | Admit: 2017-11-27 | Discharge: 2017-11-27 | Disposition: A | Discharge status: Home / Self Care | Payer: Medicaid Other | Source: Ambulatory Visit | Attending: Radiation Oncology | Admitting: Radiation Oncology

## 2017-11-27 DIAGNOSIS — C50411 Malignant neoplasm of upper-outer quadrant of right female breast: Secondary | ICD-10-CM | POA: Diagnosis not present

## 2017-11-30 ENCOUNTER — Ambulatory Visit
Admission: RE | Admit: 2017-11-30 | Discharge: 2017-11-30 | Disposition: A | Discharge status: Home / Self Care | Payer: Medicaid Other | Source: Ambulatory Visit | Attending: Radiation Oncology | Admitting: Radiation Oncology

## 2017-11-30 DIAGNOSIS — C50411 Malignant neoplasm of upper-outer quadrant of right female breast: Secondary | ICD-10-CM | POA: Diagnosis not present

## 2017-11-30 NOTE — Progress Notes (Signed)
[facility]  [phone] [phone]) [phone]  ID: Andrey Campanile OB: 05-17-1961  MR#: [id number]  QVZ#:[id number]  Patient Care Team: [place] Pink, MD as PCP - General (Family Medicine) Herbert Pun, MD as Consulting Physician (General Surgery)  CHIEF COMPLAINT: Clinical stage IB ER/PR positive, HER-2/neu negative invasive carcinoma of the upper outer quadrant of the right breast.  INTERVAL HISTORY: Patient returns to clinic for further evaluation and to assess her toleration of XRT.  She has had mild skin changes and tenderness, but otherwise is tolerating her treatments well.  She has no neurologic complaints.  She continues to have occasional insomnia.  She continues to have chronic weakness and fatigue, but remains active. She has a good appetite and denies weight loss. She has no chest pain or shortness of breath. She denies any nausea, vomiting, constipation, or diarrhea. She has no urinary complaints.  Patient offers no further specific complaints today.  REVIEW OF SYSTEMS:   Review of Systems  Constitutional: Positive for malaise/fatigue. Negative for fever and weight loss.  Respiratory: Negative.  Negative for cough and shortness of breath.   Cardiovascular: Negative.  Negative for chest pain and leg swelling.  Gastrointestinal: Negative.  Negative for abdominal pain.  Genitourinary: Negative.   Musculoskeletal: Negative for joint pain.  Skin: Negative.  Negative for itching and rash.  Neurological: Positive for weakness. Negative for tingling and sensory change.  Psychiatric/Behavioral: The patient is nervous/anxious and has insomnia.     As per HPI. Otherwise, a complete review of systems is negative.  PAST MEDICAL HISTORY: Past Medical History:  Diagnosis Date  . Anemia   . Arthritis   . Cancer ([place])   . Dyspnea    SINCE STARTING CHEMO WITH EXERTION  . GERD (gastroesophageal reflux disease)    NO MEDS  . Heart murmur    DX  IN TEENS-ASYMPTOMATIC  . Hypertension   . Migraines    H/O MIGRAINES  . Personal history of chemotherapy 2018   finished 09/08/17  . Thumb fracture    LEFT-WEARING BRACE    PAST SURGICAL HISTORY: Past Surgical History:  Procedure Laterality Date  . BREAST BIOPSY Right 03/19/2017   US guided biopsy of right breast and right lymph node/ positive  . BREAST LUMPECTOMY Right 10/14/2017  . CESAREAN SECTION    . FRACTURE SURGERY     left hand  . PARTIAL MASTECTOMY WITH NEEDLE LOCALIZATION Right 10/14/2017   Procedure: PARTIAL MASTECTOMY WITH NEEDLE LOCALIZATION;  Surgeon: Herbert Pun, MD;  Location: [facility];  Service: General;  Laterality: Right;  . PILONIDAL CYST EXCISION  1980'S  . PORT-A-CATH REMOVAL N/A 04/27/2017   Procedure: REMOVAL OF PORT A CATH & insertion PORT A CATH LEFT INTERNAL JUGULAR;  Surgeon: Herbert Pun, MD;  Location: [facility];  Service: General;  Laterality: N/A;  . PORTACATH PLACEMENT N/A 04/15/2017   Procedure: INSERTION PORT-A-CATH;  Surgeon: Herbert Pun, MD;  Location: [facility];  Service: General;  Laterality: N/A;  . SENTINEL NODE BIOPSY Right 10/14/2017   Procedure: SENTINEL NODE BIOPSY;  Surgeon: Herbert Pun, MD;  Location: [facility];  Service: General;  Laterality: Right;    FAMILY HISTORY: Family History  Problem Relation Age of Onset  . Breast cancer Maternal Grandmother   . Hypertension Maternal Grandmother   . Angina Mother   . Lung cancer Father   . Diabetes Father   . Prostate cancer Maternal Uncle   . Dementia Maternal Grandfather   . Congestive Heart Failure Maternal Grandfather   .  Heart disease Paternal Grandmother     ADVANCED DIRECTIVES (Y/N):  N  HEALTH MAINTENANCE: Social History   Tobacco Use  . Smoking status: Former Smoker    Packs/day: 2.00    Years: 18.00    Pack years: 36.00    Types: Cigarettes    Last attempt to quit: 05/01/2017    Years since quitting: 0.5  . Smokeless tobacco:  Never Used  . Tobacco comment: quit 4 months ago  Substance Use Topics  . Alcohol use: No  . Drug use: No     Colonoscopy:  PAP:  Bone density:  Lipid panel:  Allergies  Allergen Reactions  . Hydrocodone-Acetaminophen Itching and Rash  . Penicillins Swelling, Other (See Comments) and Rash    Throat swelling Has patient had a PCN reaction causing immediate rash, facial/tongue/throat swelling, SOB or lightheadedness with hypotension: Yes Has patient had a PCN reaction causing severe rash involving mucus membranes or skin necrosis: No Has patient had a PCN reaction that required hospitalization: No Has patient had a PCN reaction occurring within the last 10 years: No If all of the above answers are "NO", then may proceed with Cephalosporin use.     Current Outpatient Medications  Medication Sig Dispense Refill  . bisoprolol-hydrochlorothiazide (ZIAC) 5-6.25 MG tablet Take 1 tablet by mouth every morning.     Marland Kitchen buPROPion (WELLBUTRIN SR) 200 MG 12 hr tablet Take 200 mg by mouth 2 (two) times daily.    . Calcium-Magnesium-Zinc (CAL-MAG-ZINC PO) Take 1 tablet by mouth daily.    . ferrous sulfate 325 (65 FE) MG tablet Take 325 mg by mouth daily with breakfast.    . lidocaine-prilocaine (EMLA) cream Apply to affected area once (Patient taking differently: Apply 1 application topically daily as needed (prior to port being accessed.). Apply to affected area once) 30 g 3  . loratadine (CLARITIN) 10 MG tablet Take 10 mg by mouth daily as needed for allergies.    . Multiple Vitamins-Minerals (ADULT GUMMY PO) Take 2 tablets by mouth daily. One-A-Day Women's Multivitamin Gummies    . traMADol (ULTRAM) 50 MG tablet Take 50 mg by mouth daily as needed (for pain.).     Marland Kitchen vitamin C (ASCORBIC ACID) 500 MG tablet Take 500 mg by mouth daily.    Marland Kitchen acetaminophen (TYLENOL) 500 MG tablet Take 1,000 mg by mouth every 8 (eight) hours as needed for mild pain.    Marland Kitchen ALPRAZolam (XANAX) 0.25 MG tablet Take 1  tablet (0.25 mg total) by mouth at bedtime as needed for anxiety. (Patient not taking: Reported on 12/02/2017) 30 tablet 1  . diclofenac sodium (VOLTAREN) 1 % GEL Apply 2 g topically 4 (four) times daily as needed (for pain in right thumb).    . ondansetron (ZOFRAN) 8 MG tablet Take 1 tablet (8 mg total) by mouth 2 (two) times daily as needed. (Patient not taking: Reported on 12/02/2017) 60 tablet 2  . prochlorperazine (COMPAZINE) 10 MG tablet Take 1 tablet (10 mg total) by mouth every 6 (six) hours as needed (Nausea or vomiting). (Patient not taking: Reported on 12/02/2017) 60 tablet 2  . promethazine (PHENERGAN) 25 MG suppository Place 1 suppository (25 mg total) rectally every 6 (six) hours as needed for nausea or vomiting. (Patient not taking: Reported on 10/14/2017) 30 each 0   No current facility-administered medications for this visit.    Facility-Administered Medications Ordered in Other Visits  Medication Dose Route Frequency Provider Last Rate Last Dose  . sodium chloride flush (NS)  0.9 % injection 10 mL  10 mL Intracatheter PRN Lloyd Huger, MD   10 mL at 07/15/17 0855    OBJECTIVE: Vitals:   12/02/17 0949  BP: (!) 146/79  Resp: 18  Temp: 97.6 F (36.4 C)  SpO2: 97%     Body mass index is 34.95 kg/m.    ECOG FS:0 - Asymptomatic  General: Well-developed, well-nourished, no acute distress. Eyes: Pink conjunctiva, anicteric sclera. Breast: Right breast with some mild erythema, no ulceration. Lungs: Clear to auscultation bilaterally. Heart: Regular rate and rhythm. No rubs, murmurs, or gallops. Abdomen: Soft, nontender, nondistended. No organomegaly noted, normoactive bowel sounds. Musculoskeletal: No edema, cyanosis, or clubbing. Neuro: Alert, answering all questions appropriately. Cranial nerves grossly intact. Skin: No rashes or petechiae noted. Psych: Normal affect.  LAB RESULTS:  Lab Results  Component Value Date   NA 140 10/22/2017   K 3.7 10/22/2017   CL 107  10/22/2017   CO2 25 10/22/2017   GLUCOSE 93 10/22/2017   BUN 10 10/22/2017   CREATININE 0.60 10/22/2017   CALCIUM 9.0 10/22/2017   PROT 6.8 10/22/2017   ALBUMIN 3.6 10/22/2017   AST 20 10/22/2017   ALT 14 10/22/2017   ALKPHOS 86 10/22/2017   BILITOT 0.4 10/22/2017   GFRNONAA >60 10/22/2017   GFRAA >60 10/22/2017    Lab Results  Component Value Date   WBC 6.8 12/02/2017   NEUTROABS 6.1 10/22/2017   HGB 11.4 (L) 12/02/2017   HCT 35.0 12/02/2017   MCV 72.9 (L) 12/02/2017   PLT 245 12/02/2017     STUDIES: No results found.  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  PLAN:    1. 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  She then underwent partial mastectomy on October 14, 2017.  Patient noted to have residual disease, therefore proceeded with adjuvant XRT which she will complete on January 06, 2018.  She will require an aromatase inhibitor for a total of 5 years when she completes her XRT.  Return to clinic on her final day of XRT for further evaluation and initiation of an aromatase inhibitor.     2. Right hand fracture: Continue monitoring and treatment per orthopedics.  Continue with injections for pain as needed.  Case previously discussed with Dr. Grandville Silos.   3.  Anemia: Improving. 4.  Insomnia/anxiety: Continue Xanax as needed. 5.  Neuropathy: Patient does not complain of this today.   Approximately 30 minutes was spent in discussion of which greater than 50% was consultation.  Patient expressed understanding and was in agreement with this plan. She also understands that She can call clinic at any time with any questions, concerns, or complaints.   Cancer Staging Primary cancer of upper outer quadrant of  right female breast [facility]) Staging form: Breast, AJCC 8th Edition - Clinical stage from 03/27/2017: Stage IB (cT1c, cN1, cM0, G2, ER: Positive, PR: Positive, HER2: Negative) - Signed by Lloyd Huger, MD on 03/27/2017 - Pathologic stage from 10/22/2017: No Stage Recommended (ypT1c, pN1a, cM0, G2, ER+, PR+, HER2-) - Signed by Lloyd Huger, MD on 10/22/2017   Lloyd Huger, MD   12/04/2017 12:59 PM

## 2017-12-01 ENCOUNTER — Ambulatory Visit
Admission: RE | Admit: 2017-12-01 | Discharge: 2017-12-01 | Disposition: A | Discharge status: Home / Self Care | Payer: Medicaid Other | Source: Ambulatory Visit | Attending: Radiation Oncology | Admitting: Radiation Oncology

## 2017-12-01 DIAGNOSIS — C50411 Malignant neoplasm of upper-outer quadrant of right female breast: Secondary | ICD-10-CM | POA: Diagnosis not present

## 2017-12-02 ENCOUNTER — Other Ambulatory Visit: Payer: Self-pay

## 2017-12-02 ENCOUNTER — Ambulatory Visit
Admission: RE | Admit: 2017-12-02 | Discharge: 2017-12-02 | Disposition: A | Discharge status: Home / Self Care | Payer: Medicaid Other | Source: Ambulatory Visit | Attending: Radiation Oncology | Admitting: Radiation Oncology

## 2017-12-02 ENCOUNTER — Inpatient Hospital Stay: Payer: Medicaid Other

## 2017-12-02 ENCOUNTER — Inpatient Hospital Stay (HOSPITAL_BASED_OUTPATIENT_CLINIC_OR_DEPARTMENT_OTHER): Payer: Medicaid Other | Admitting: Oncology

## 2017-12-02 ENCOUNTER — Encounter: Payer: Self-pay | Admitting: Oncology

## 2017-12-02 ENCOUNTER — Inpatient Hospital Stay: Payer: Medicaid Other | Attending: Oncology

## 2017-12-02 VITALS — BP 146/79 | Temp 97.6°F | Resp 18 | Ht 65.0 in | Wt 210.0 lb

## 2017-12-02 DIAGNOSIS — Z95828 Presence of other vascular implants and grafts: Secondary | ICD-10-CM

## 2017-12-02 DIAGNOSIS — G629 Polyneuropathy, unspecified: Secondary | ICD-10-CM | POA: Insufficient documentation

## 2017-12-02 DIAGNOSIS — C50411 Malignant neoplasm of upper-outer quadrant of right female breast: Secondary | ICD-10-CM

## 2017-12-02 DIAGNOSIS — Z170 Estrogen receptor positive status [ER+]: Principal | ICD-10-CM

## 2017-12-02 DIAGNOSIS — D649 Anemia, unspecified: Secondary | ICD-10-CM | POA: Diagnosis not present

## 2017-12-02 DIAGNOSIS — Z452 Encounter for adjustment and management of vascular access device: Secondary | ICD-10-CM | POA: Diagnosis not present

## 2017-12-02 DIAGNOSIS — M7989 Other specified soft tissue disorders: Secondary | ICD-10-CM | POA: Diagnosis present

## 2017-12-02 LAB — CBC
HCT: 35 % (ref 35.0–47.0)
HEMOGLOBIN: 11.4 g/dL — AB (ref 12.0–16.0)
MCH: 23.7 pg — ABNORMAL LOW (ref 26.0–34.0)
MCHC: 32.5 g/dL (ref 32.0–36.0)
MCV: 72.9 fL — ABNORMAL LOW (ref 80.0–100.0)
Platelets: 245 10*3/uL (ref 150–440)
RBC: 4.8 MIL/uL (ref 3.80–5.20)
RDW: 15.7 % — ABNORMAL HIGH (ref 11.5–14.5)
WBC: 6.8 10*3/uL (ref 3.6–11.0)

## 2017-12-02 MED ORDER — SODIUM CHLORIDE 0.9% FLUSH
10.0000 mL | Freq: Once | INTRAVENOUS | Status: AC
  Administered 2017-12-02: 10 mL via INTRAVENOUS
  Filled 2017-12-02: qty 10

## 2017-12-02 MED ORDER — HEPARIN SOD (PORK) LOCK FLUSH 100 UNIT/ML IV SOLN
500.0000 [IU] | Freq: Once | INTRAVENOUS | Status: AC
  Administered 2017-12-02: 500 [IU] via INTRAVENOUS

## 2017-12-02 NOTE — Progress Notes (Signed)
No new changes noted today 

## 2017-12-03 ENCOUNTER — Ambulatory Visit
Admission: RE | Admit: 2017-12-03 | Discharge: 2017-12-03 | Disposition: A | Discharge status: Home / Self Care | Payer: Medicaid Other | Source: Ambulatory Visit | Attending: Radiation Oncology | Admitting: Radiation Oncology

## 2017-12-03 DIAGNOSIS — C50411 Malignant neoplasm of upper-outer quadrant of right female breast: Secondary | ICD-10-CM | POA: Diagnosis not present

## 2017-12-04 ENCOUNTER — Ambulatory Visit: Payer: Medicaid Other

## 2017-12-05 ENCOUNTER — Ambulatory Visit: Payer: Medicaid Other

## 2017-12-05 IMAGING — US US BREAST*R* LIMITED INC AXILLA
1 series · 13 of 22 positions shown · non-contrast
Comparison: Previous exam(s).

CLINICAL DATA: Right breast upper outer quadrant area of palpable
concern felt clinically.

EXAM:
2D DIGITAL DIAGNOSTIC BILATERAL MAMMOGRAM WITH CAD AND ADJUNCT TOMO
ULTRASOUND RIGHT BREAST

[Series 1: us breast*right* limited inc axilla · 0.07mm/px · 13 of 22 slices shown]
[im 1/22]
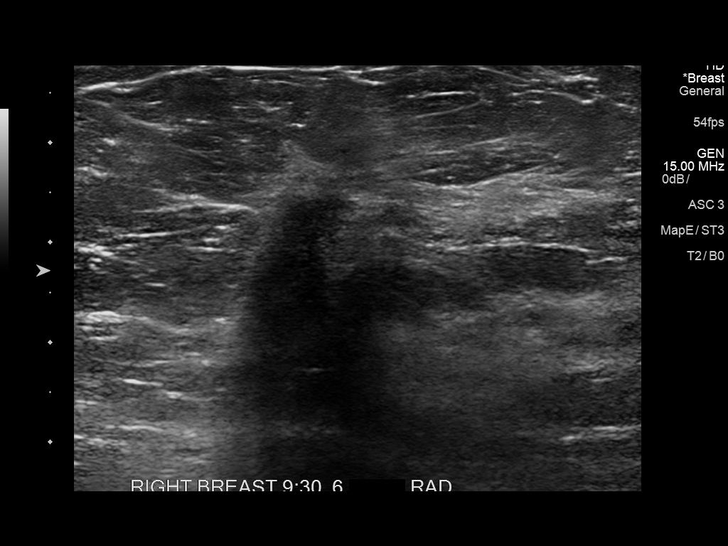
[im 3/22]
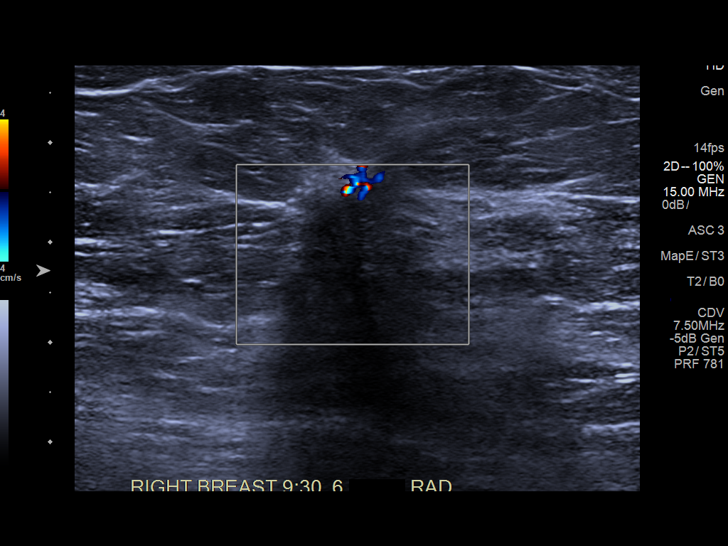
[im 5/22]
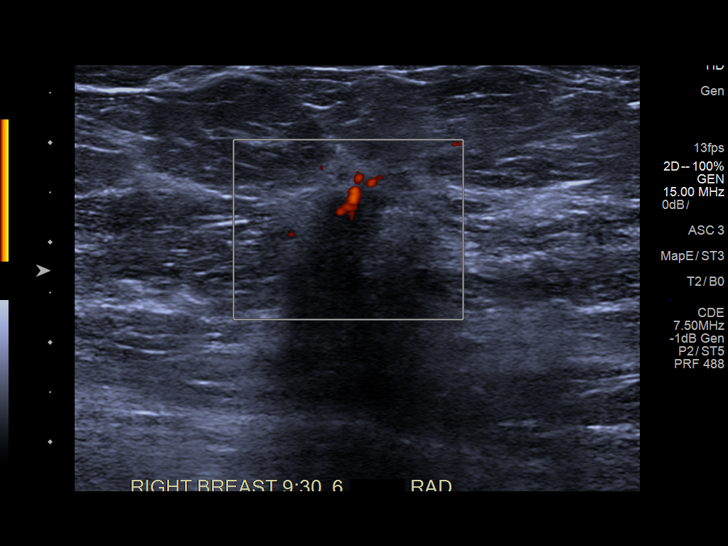
[im 6/22]
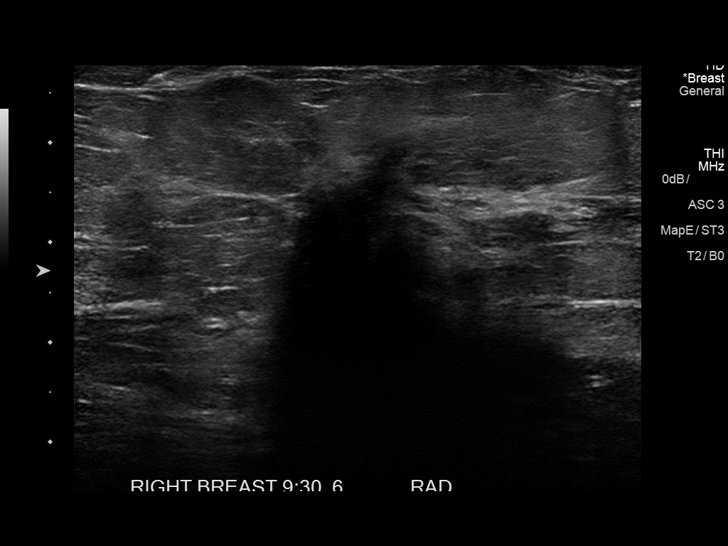
[im 8/22]
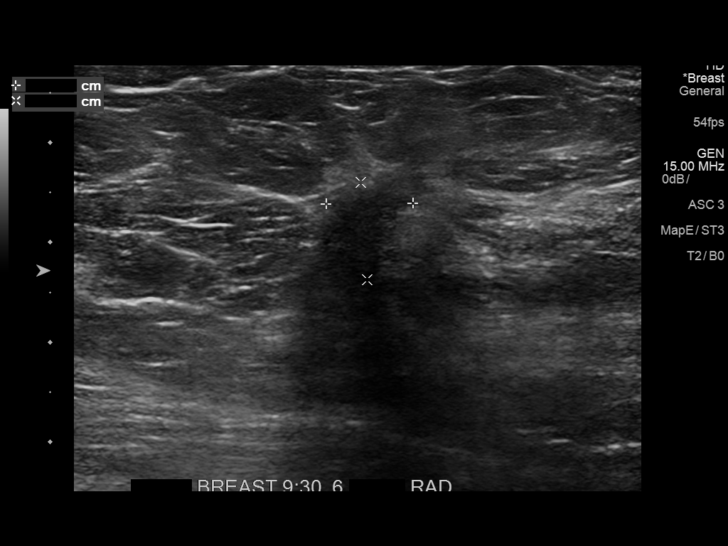
[im 10/22]
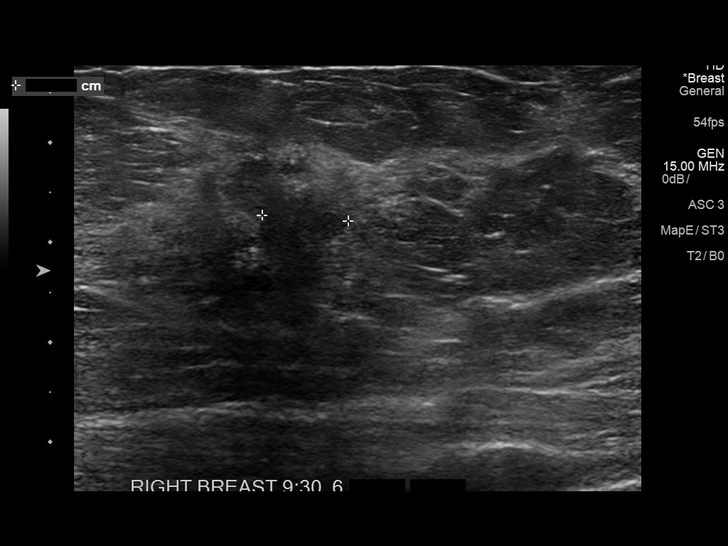
[im 12/22]
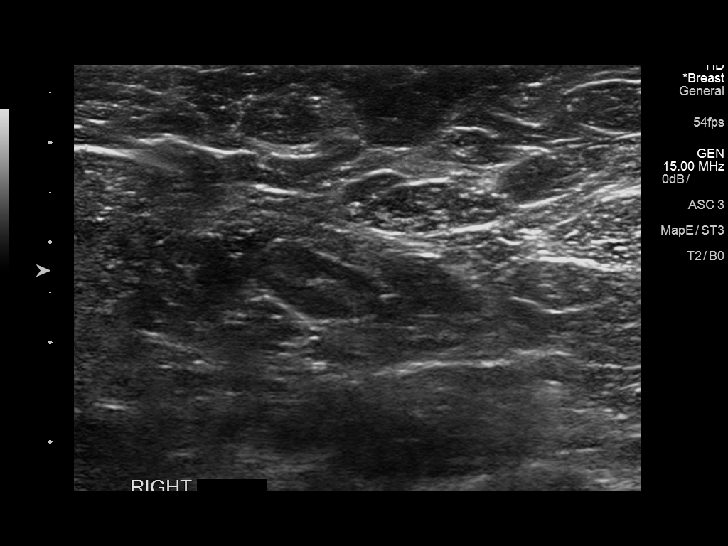
[im 13/22]
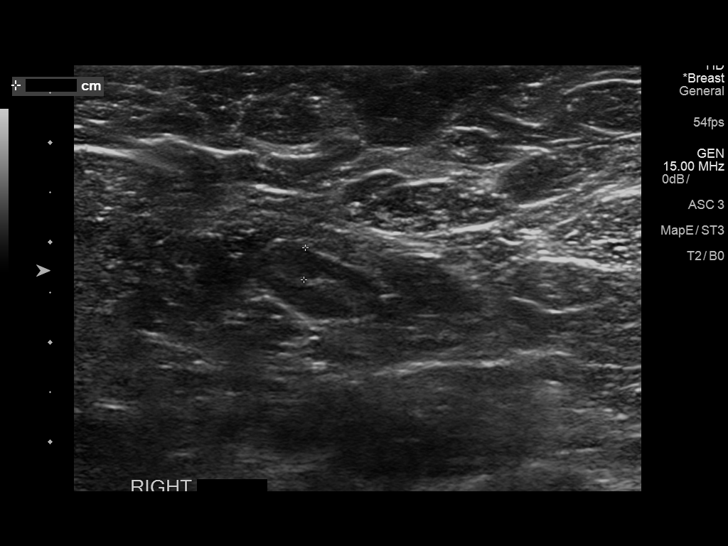
[im 15/22]
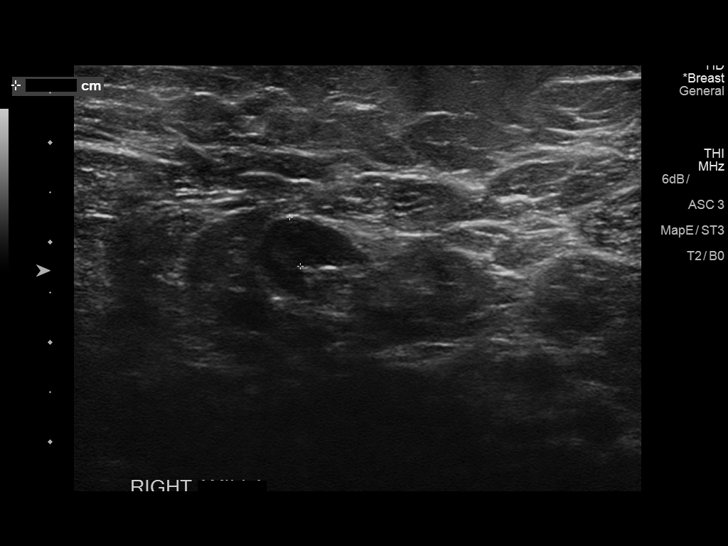
[im 17/22]
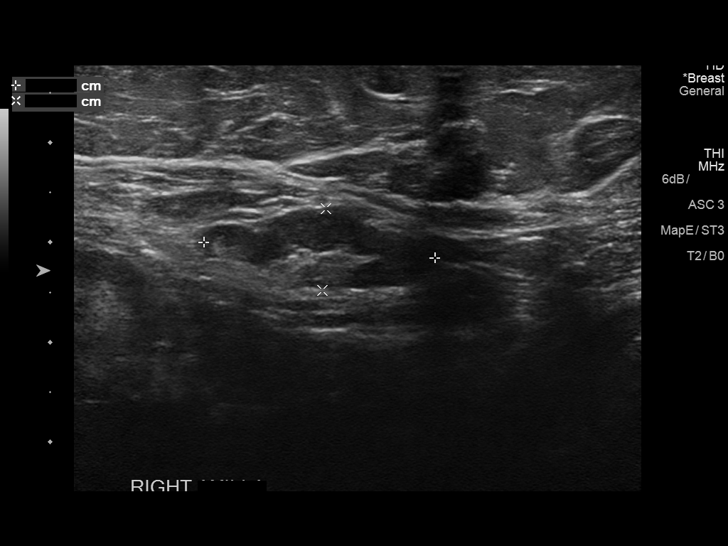
[im 18/22]
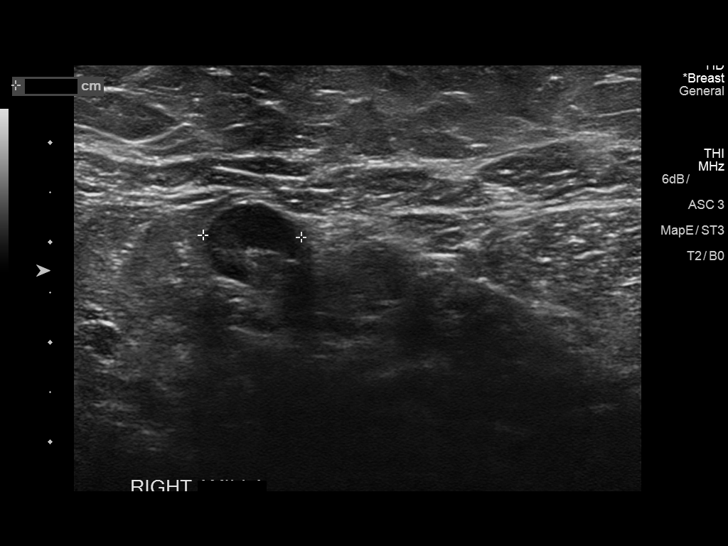
[im 20/22]
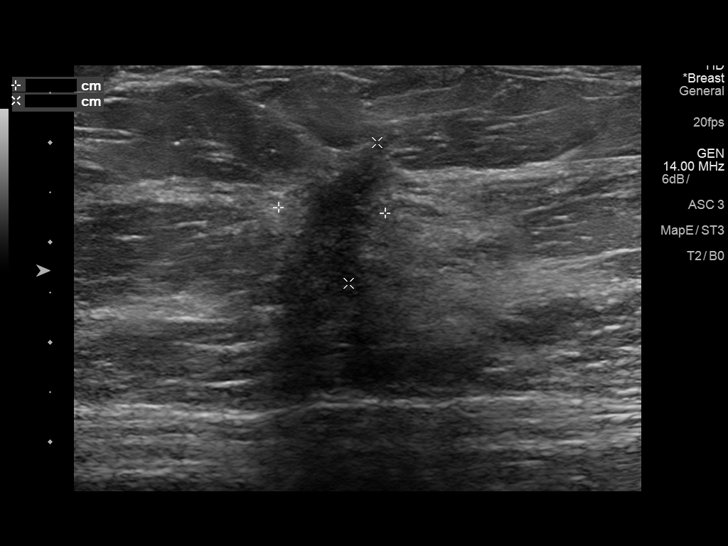
[im 22/22]
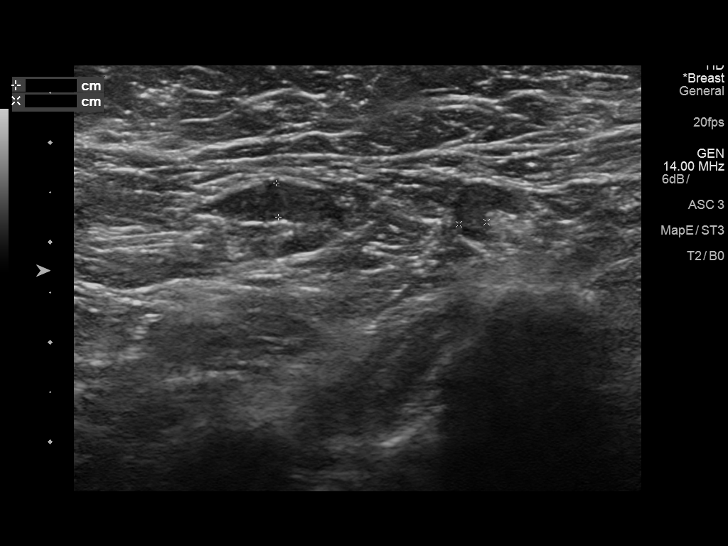

[13 of 22 positions shown; findings below may reference images not displayed]

ACR Breast Density Category c: The breast tissue is heterogeneously
dense, which may obscure small masses.
FINDINGS: Mammographically, there are no suspicious masses, areas of
architectural distortion or microcalcifications in the left breast.
There is an ill-defined spiculated mass in the right breast upper
outer quadrant, posterior depth, which corresponds to the area of
palpable concern.

Mammographic images were processed with CAD.

On physical exam, palpable moderately firm approximately 1.5 cm
nodule in the right breast upper outer quadrant, posterior depth.

Targeted ultrasound is performed, showing right breast 930 o'clock 6
cm from the nipple hypoechoic irregular mass with associated
hypervascularity and posterior acoustic shadowing. It measures 1.4 x
1.1 x 0.7 cm. This finding corresponds to the mammographically seen
mass. There are 2 lymph nodes in the right axilla with mildly
increased cortical thickening measuring 3 mm.
IMPRESSION: Suspicious right breast 930 o'clock mass.

Two indeterminate right axillary lymph nodes.

RECOMMENDATION:
Ultrasound-guided core needle biopsy of right breast 930 o'clock
mass and 1 of the right axillary lymph nodes with thickened cortex.

I have discussed the findings and recommendations with the patient.
Results were also provided in writing at the conclusion of the
visit. If applicable, a reminder letter will be sent to the patient
regarding the next appointment.

BI-RADS CATEGORY  4: Suspicious.

## 2017-12-07 ENCOUNTER — Ambulatory Visit
Admission: RE | Admit: 2017-12-07 | Discharge: 2017-12-07 | Disposition: A | Discharge status: Home / Self Care | Payer: Medicaid Other | Source: Ambulatory Visit | Attending: Radiation Oncology | Admitting: Radiation Oncology

## 2017-12-07 DIAGNOSIS — C50411 Malignant neoplasm of upper-outer quadrant of right female breast: Secondary | ICD-10-CM | POA: Diagnosis not present

## 2017-12-08 ENCOUNTER — Ambulatory Visit
Admission: RE | Admit: 2017-12-08 | Discharge: 2017-12-08 | Disposition: A | Discharge status: Home / Self Care | Payer: Medicaid Other | Source: Ambulatory Visit | Attending: Radiation Oncology | Admitting: Radiation Oncology

## 2017-12-08 ENCOUNTER — Other Ambulatory Visit: Payer: Self-pay | Admitting: *Deleted

## 2017-12-08 DIAGNOSIS — C50411 Malignant neoplasm of upper-outer quadrant of right female breast: Secondary | ICD-10-CM | POA: Diagnosis not present

## 2017-12-08 MED ORDER — GABAPENTIN 300 MG PO CAPS: 300.0000 mg | ORAL_CAPSULE | Freq: Three times a day (TID) | ORAL | 1 refills | Status: DC

## 2017-12-09 ENCOUNTER — Ambulatory Visit
Admission: RE | Admit: 2017-12-09 | Discharge: 2017-12-09 | Disposition: A | Discharge status: Home / Self Care | Payer: Medicaid Other | Source: Ambulatory Visit | Attending: Radiation Oncology | Admitting: Radiation Oncology

## 2017-12-09 DIAGNOSIS — C50411 Malignant neoplasm of upper-outer quadrant of right female breast: Secondary | ICD-10-CM | POA: Diagnosis not present

## 2017-12-10 ENCOUNTER — Inpatient Hospital Stay: Payer: Medicaid Other

## 2017-12-10 ENCOUNTER — Ambulatory Visit
Admission: RE | Admit: 2017-12-10 | Discharge: 2017-12-10 | Disposition: A | Discharge status: Home / Self Care | Payer: Medicaid Other | Source: Ambulatory Visit | Attending: Oncology | Admitting: Oncology

## 2017-12-10 ENCOUNTER — Other Ambulatory Visit: Payer: Self-pay | Admitting: *Deleted

## 2017-12-10 ENCOUNTER — Inpatient Hospital Stay (HOSPITAL_BASED_OUTPATIENT_CLINIC_OR_DEPARTMENT_OTHER): Payer: Medicaid Other | Admitting: Oncology

## 2017-12-10 ENCOUNTER — Ambulatory Visit
Admission: RE | Admit: 2017-12-10 | Discharge: 2017-12-10 | Disposition: A | Discharge status: Home / Self Care | Payer: Medicaid Other | Source: Ambulatory Visit | Attending: Radiation Oncology | Admitting: Radiation Oncology

## 2017-12-10 ENCOUNTER — Encounter: Payer: Self-pay | Admitting: Oncology

## 2017-12-10 VITALS — BP 145/85 | HR 63 | Temp 98.4°F | Resp 20 | Wt 210.0 lb

## 2017-12-10 DIAGNOSIS — C50411 Malignant neoplasm of upper-outer quadrant of right female breast: Secondary | ICD-10-CM

## 2017-12-10 DIAGNOSIS — M7989 Other specified soft tissue disorders: Secondary | ICD-10-CM

## 2017-12-10 LAB — CBC WITH DIFFERENTIAL/PLATELET
BASOS ABS: 0 10*3/uL (ref 0–0.1)
BASOS PCT: 0 %
EOS ABS: 0.2 10*3/uL (ref 0–0.7)
EOS PCT: 3 %
HCT: 35.6 % (ref 35.0–47.0)
Hemoglobin: 11.6 g/dL — ABNORMAL LOW (ref 12.0–16.0)
LYMPHS PCT: 15 %
Lymphs Abs: 1.1 10*3/uL (ref 1.0–3.6)
MCH: 23.6 pg — ABNORMAL LOW (ref 26.0–34.0)
MCHC: 32.6 g/dL (ref 32.0–36.0)
MCV: 72.4 fL — AB (ref 80.0–100.0)
MONO ABS: 0.9 10*3/uL (ref 0.2–0.9)
Monocytes Relative: 14 %
Neutro Abs: 4.6 10*3/uL (ref 1.4–6.5)
Neutrophils Relative %: 68 %
PLATELETS: 217 10*3/uL (ref 150–440)
RBC: 4.92 MIL/uL (ref 3.80–5.20)
RDW: 16.2 % — AB (ref 11.5–14.5)
WBC: 6.9 10*3/uL (ref 3.6–11.0)

## 2017-12-10 LAB — COMPREHENSIVE METABOLIC PANEL
ALT: 14 U/L (ref 14–54)
AST: 21 U/L (ref 15–41)
Albumin: 3.8 g/dL (ref 3.5–5.0)
Alkaline Phosphatase: 88 U/L (ref 38–126)
Anion gap: 8 (ref 5–15)
BUN: 9 mg/dL (ref 6–20)
CHLORIDE: 105 mmol/L (ref 101–111)
CO2: 27 mmol/L (ref 22–32)
Calcium: 9.7 mg/dL (ref 8.9–10.3)
Creatinine, Ser: 0.87 mg/dL (ref 0.44–1.00)
Glucose, Bld: 109 mg/dL — ABNORMAL HIGH (ref 65–99)
POTASSIUM: 4 mmol/L (ref 3.5–5.1)
SODIUM: 140 mmol/L (ref 135–145)
Total Bilirubin: 0.5 mg/dL (ref 0.3–1.2)
Total Protein: 7.1 g/dL (ref 6.5–8.1)

## 2017-12-10 LAB — MAGNESIUM: Magnesium: 1.6 mg/dL — ABNORMAL LOW (ref 1.7–2.4)

## 2017-12-10 NOTE — Progress Notes (Signed)
Symptom Management Consult note [facility]  [phone] [phone]  Patient Care Team: [place] Pink, MD as PCP - General (Family Medicine) Herbert Pun, MD as Consulting Physician (General Surgery)   Name of the patient: Destiny Rogers  [id number]  1961/04/03   Date of visit: 12/11/17  Diagnosis- Breast Cancer   Chief complaint/ Reason for visit-  Left leg swelling  Heme/Onc history: Patient last seen by primary medical oncologist Dr. Grayland Ormond on 12/02/2017 to assess her toleration of radiation.  She noted mild skin changes but otherwise tolerating treatment well.  She maintains a good appetite and denies weight loss.  She has chronic weakness and fatigue but remains active.   Patient completed neoadjuvant chemotherapy with Adriamycin, Cytoxan and Taxol in February 2019.  Had partial mastectomy on 10/14/17.  Noted to have residual disease therefore we proceeded with adjuvant radiation to be completed at the end of June 2019.  She will then be started on an aromatase inhibitor for a total of 5 years after radiation.  Interval history-  Patient complains of edema. The location of the edema is feet left.  The edema has been moderate.  Onset of symptoms was 1 day ago, gradually worsening since that time. The edema is present all day. The patient states never.  The swelling has been aggravated by nothing, relieved by nothing, and been associated with nothing. Cardiac risk factors include obesity (BMI >= 30 kg/m2) and sedentary lifestyle. She also has history of breast cancer currently receiving radiation.  ECOG FS:0 - Asymptomatic  Review of systems- Review of Systems  Constitutional: Negative.  Negative for chills, fever, malaise/fatigue and weight loss.  HENT: Negative for congestion and ear pain.   Eyes: Negative.  Negative for blurred vision and double vision.  Respiratory: Negative.  Negative for cough, sputum production and  shortness of breath.   Cardiovascular: Positive for leg swelling (l leg > R leg). Negative for chest pain and palpitations.  Gastrointestinal: Negative.  Negative for abdominal pain, constipation, diarrhea, nausea and vomiting.  Genitourinary: Negative for dysuria, frequency and urgency.  Musculoskeletal: Negative for back pain and falls.  Skin: Negative.  Negative for rash.  Neurological: Negative.  Negative for weakness and headaches.  Endo/Heme/Allergies: Negative.  Does not bruise/bleed easily.  Psychiatric/Behavioral: Negative.  Negative for depression. The patient is not nervous/anxious and does not have insomnia.      Current treatment- Completed 12 cycles of Taxol in February 2019. Began Radiation therapy to right breast 11/16/17.  Allergies  Allergen Reactions  . Hydrocodone-Acetaminophen Itching and Rash  . Penicillins Swelling, Other (See Comments) and Rash    Throat swelling Has patient had a PCN reaction causing immediate rash, facial/tongue/throat swelling, SOB or lightheadedness with hypotension: Yes Has patient had a PCN reaction causing severe rash involving mucus membranes or skin necrosis: No Has patient had a PCN reaction that required hospitalization: No Has patient had a PCN reaction occurring within the last 10 years: No If all of the above answers are "NO", then may proceed with Cephalosporin use.      Past Medical History:  Diagnosis Date  . Anemia   . Arthritis   . Cancer ([place])   . Dyspnea    SINCE STARTING CHEMO WITH EXERTION  . GERD (gastroesophageal reflux disease)    NO MEDS  . Heart murmur    DX IN TEENS-ASYMPTOMATIC  . Hypertension   . Migraines    H/O MIGRAINES  . Personal history of chemotherapy  2018   finished 09/08/17  . Thumb fracture    LEFT-WEARING BRACE     Past Surgical History:  Procedure Laterality Date  . BREAST BIOPSY Right 03/19/2017   US guided biopsy of right breast and right lymph node/ positive  . BREAST LUMPECTOMY  Right 10/14/2017  . CESAREAN SECTION    . FRACTURE SURGERY     left hand  . PARTIAL MASTECTOMY WITH NEEDLE LOCALIZATION Right 10/14/2017   Procedure: PARTIAL MASTECTOMY WITH NEEDLE LOCALIZATION;  Surgeon: Herbert Pun, MD;  Location: [facility] ORS;  Service: General;  Laterality: Right;  . PILONIDAL CYST EXCISION  1980'S  . PORT-A-CATH REMOVAL N/A 04/27/2017   Procedure: REMOVAL OF PORT A CATH & insertion PORT A CATH LEFT INTERNAL JUGULAR;  Surgeon: Herbert Pun, MD;  Location: [facility] ORS;  Service: General;  Laterality: N/A;  . PORTACATH PLACEMENT N/A 04/15/2017   Procedure: INSERTION PORT-A-CATH;  Surgeon: Herbert Pun, MD;  Location: [facility] ORS;  Service: General;  Laterality: N/A;  . SENTINEL NODE BIOPSY Right 10/14/2017   Procedure: SENTINEL NODE BIOPSY;  Surgeon: Herbert Pun, MD;  Location: [facility] ORS;  Service: General;  Laterality: Right;    Social History   Socioeconomic History  . Marital status: Widowed    Spouse name: Not on file  . Number of children: Not on file  . Years of education: Not on file  . Highest education level: Not on file  Occupational History  . Not on file  Social Needs  . Financial resource strain: Not on file  . Food insecurity:    Worry: Not on file    Inability: Not on file  . Transportation needs:    Medical: Not on file    Non-medical: Not on file  Tobacco Use  . Smoking status: Former Smoker    Packs/day: 2.00    Years: 18.00    Pack years: 36.00    Types: Cigarettes    Last attempt to quit: 05/01/2017    Years since quitting: 0.6  . Smokeless tobacco: Never Used  . Tobacco comment: quit 4 months ago  Substance and Sexual Activity  . Alcohol use: No  . Drug use: No  . Sexual activity: Not on file  Lifestyle  . Physical activity:    Days per week: Not on file    Minutes per session: Not on file  . Stress: Not on file  Relationships  . Social connections:    Talks on phone: Not on file    Gets together:  Not on file    Attends religious service: Not on file    Active member of club or organization: Not on file    Attends meetings of clubs or organizations: Not on file    Relationship status: Not on file  . Intimate partner violence:    Fear of current or ex partner: Not on file    Emotionally abused: Not on file    Physically abused: Not on file    Forced sexual activity: Not on file  Other Topics Concern  . Not on file  Social History Narrative  . Not on file    Family History  Problem Relation Age of Onset  . Breast cancer Maternal Grandmother   . Hypertension Maternal Grandmother   . Angina Mother   . Lung cancer Father   . Diabetes Father   . Prostate cancer Maternal Uncle   . Dementia Maternal Grandfather   . Congestive Heart Failure Maternal Grandfather   . Heart disease Paternal  Grandmother      Current Outpatient Medications:  .  acetaminophen (TYLENOL) 500 MG tablet, Take 1,000 mg by mouth every 8 (eight) hours as needed for mild pain., Disp: , Rfl:  .  bisoprolol-hydrochlorothiazide (ZIAC) 5-6.25 MG tablet, Take 1 tablet by mouth every morning. , Disp: , Rfl:  .  buPROPion (WELLBUTRIN SR) 200 MG 12 hr tablet, Take 200 mg by mouth 2 (two) times daily., Disp: , Rfl:  .  Calcium-Magnesium-Zinc (CAL-MAG-ZINC PO), Take 1 tablet by mouth daily., Disp: , Rfl:  .  diclofenac sodium (VOLTAREN) 1 % GEL, Apply 2 g topically 4 (four) times daily as needed (for pain in right thumb)., Disp: , Rfl:  .  ferrous sulfate 325 (65 FE) MG tablet, Take 325 mg by mouth daily with breakfast., Disp: , Rfl:  .  gabapentin (NEURONTIN) 300 MG capsule, Take 1 capsule (300 mg total) by mouth 3 (three) times daily., Disp: 90 capsule, Rfl: 1 .  lidocaine-prilocaine (EMLA) cream, Apply to affected area once (Patient taking differently: Apply 1 application topically daily as needed (prior to port being accessed.). Apply to affected area once), Disp: 30 g, Rfl: 3 .  loratadine (CLARITIN) 10 MG tablet,  Take 10 mg by mouth daily as needed for allergies., Disp: , Rfl:  .  traMADol (ULTRAM) 50 MG tablet, Take 50 mg by mouth daily as needed (for pain.). , Disp: , Rfl:  .  vitamin C (ASCORBIC ACID) 500 MG tablet, Take 500 mg by mouth daily., Disp: , Rfl:  .  ALPRAZolam (XANAX) 0.25 MG tablet, Take 1 tablet (0.25 mg total) by mouth at bedtime as needed for anxiety. (Patient not taking: Reported on 12/02/2017), Disp: 30 tablet, Rfl: 1 .  Multiple Vitamins-Minerals (ADULT GUMMY PO), Take 2 tablets by mouth daily. One-A-Day Women's Multivitamin Gummies, Disp: , Rfl:  .  ondansetron (ZOFRAN) 8 MG tablet, Take 1 tablet (8 mg total) by mouth 2 (two) times daily as needed. (Patient not taking: Reported on 12/10/2017), Disp: 60 tablet, Rfl: 2 .  prochlorperazine (COMPAZINE) 10 MG tablet, Take 1 tablet (10 mg total) by mouth every 6 (six) hours as needed (Nausea or vomiting). (Patient not taking: Reported on 12/02/2017), Disp: 60 tablet, Rfl: 2 .  promethazine (PHENERGAN) 25 MG suppository, Place 1 suppository (25 mg total) rectally every 6 (six) hours as needed for nausea or vomiting. (Patient not taking: Reported on 10/14/2017), Disp: 30 each, Rfl: 0 No current facility-administered medications for this visit.   Facility-Administered Medications Ordered in Other Visits:  .  sodium chloride flush (NS) 0.9 % injection 10 mL, 10 mL, Intracatheter, PRN, Lloyd Huger, MD, 10 mL at 07/15/17 0855  Physical exam:  Vitals:   12/10/17 1118  BP: (!) 145/85  Pulse: 63  Resp: 20  Temp: 98.4 F (36.9 C)  TempSrc: Tympanic  SpO2: 98%  Weight: 210 lb (95.3 kg)   Physical Exam  Constitutional: She is oriented to person, place, and time. Vital signs are normal.  HENT:  Head: Normocephalic and atraumatic.  Eyes: Pupils are equal, round, and reactive to light.  Neck: Normal range of motion.  Cardiovascular: Normal rate, regular rhythm and normal heart sounds.  No murmur heard. Pulmonary/Chest: Effort normal and  breath sounds normal. She has no wheezes.  Abdominal: Soft. Normal appearance and bowel sounds are normal. She exhibits no distension. There is no tenderness.  Musculoskeletal: Normal range of motion. She exhibits edema (Left leg +1 pitting edema).  Neurological: She is alert and oriented  to person, place, and time.  Skin: Skin is warm and dry. No rash noted.  Psychiatric: Judgment normal.     CMP Latest Ref Rng & Units 12/10/2017  Glucose 65 - 99 mg/dL 109(H)  BUN 6 - 20 mg/dL 9  Creatinine 0.44 - 1.00 mg/dL 0.87  Sodium 135 - 145 mmol/L 140  Potassium 3.5 - 5.1 mmol/L 4.0  Chloride 101 - 111 mmol/L 105  CO2 22 - 32 mmol/L 27  Calcium 8.9 - 10.3 mg/dL 9.7  Total Protein 6.5 - 8.1 g/dL 7.1  Total Bilirubin 0.3 - 1.2 mg/dL 0.5  Alkaline Phos 38 - 126 U/L 88  AST 15 - 41 U/L 21  ALT 14 - 54 U/L 14   CBC Latest Ref Rng & Units 12/10/2017  WBC 3.6 - 11.0 K/uL 6.9  Hemoglobin 12.0 - 16.0 g/dL 11.6(L)  Hematocrit 35.0 - 47.0 % 35.6  Platelets 150 - 440 K/uL 217    No images are attached to the encounter.  US Venous Img Lower Unilateral Left  Result Date: 12/10/2017 CLINICAL DATA:  Left leg pain and swelling EXAM: LEFT LOWER EXTREMITY VENOUS DOPPLER ULTRASOUND TECHNIQUE: Gray-scale sonography with graded compression, as well as color Doppler and duplex ultrasound were performed to evaluate the lower extremity deep venous systems from the level of the common femoral vein and including the common femoral, femoral, profunda femoral, popliteal and calf veins including the posterior tibial, peroneal and gastrocnemius veins when visible. The superficial great saphenous vein was also interrogated. Spectral Doppler was utilized to evaluate flow at rest and with distal augmentation maneuvers in the common femoral, femoral and popliteal veins. COMPARISON:  None. FINDINGS: Contralateral Common Femoral Vein: Respiratory phasicity is normal and symmetric with the symptomatic side. No evidence of  thrombus. Normal compressibility. Common Femoral Vein: No evidence of thrombus. Normal compressibility, respiratory phasicity and response to augmentation. Saphenofemoral Junction: No evidence of thrombus. Normal compressibility and flow on color Doppler imaging. Profunda Femoral Vein: No evidence of thrombus. Normal compressibility and flow on color Doppler imaging. Femoral Vein: No evidence of thrombus. Normal compressibility, respiratory phasicity and response to augmentation. Popliteal Vein: No evidence of thrombus. Normal compressibility, respiratory phasicity and response to augmentation. Calf Veins: No evidence of thrombus. Normal compressibility and flow on color Doppler imaging. Superficial Great Saphenous Vein: No evidence of thrombus. Normal compressibility. Venous Reflux:  None. Other Findings:  None. IMPRESSION: No evidence of deep venous thrombosis. Electronically Signed   By: Inez Catalina M.D.   On: 12/10/2017 13:39     Assessment and plan- Patient is a 57 y.o. female who presents for left lower extremity swelling X 1 day.   1.  Breast cancer: S/p neoadjuvant chemotherapy with Adriamycin, Cytoxan and Taxol in February 2019 and partial mastectomy in March 2019.  Residual disease noted.  Began radiation therapy on 11/16/2017 and is scheduled to finish at the end of June 2019.  Tolerating radiation well.  Has scheduled follow-up with Dr. Grayland Ormond on 01/05/2018.   2.  Left lower extremity swelling: Noticed leg swelling morning.  Not on anticoagulants.  States she is been very inactive.  Denies pain or tenderness to left leg.  Will get stat venous ultrasound of left lower extremity.  Negative for DVT.  Recommend the use of TED hose and elevation.  Patient in agreement with plan.  3.  Hypomagnesia: Magnesium level 1.6 today.  Encouraged a diet rich in magnesium such as nuts, dark chocolate and fruits and vegetables.    Visit Diagnosis  1. Swelling of left lower extremity     Patient expressed  understanding and was in agreement with this plan. She also understands that She can call clinic at any time with any questions, concerns, or complaints.   Greater than 50% was spent in counseling and coordination of care with this patient including but not limited to discussion of the relevant topics above (See A&P) including, but not limited to diagnosis and management of acute and chronic medical conditions.    Faythe Casa, AGNP-C [facility]- [phone] Pager- [phone] 12/11/2017 3:07 PM

## 2017-12-11 ENCOUNTER — Ambulatory Visit: Payer: Medicaid Other

## 2017-12-11 ENCOUNTER — Ambulatory Visit
Admission: RE | Admit: 2017-12-11 | Discharge: 2017-12-11 | Disposition: A | Discharge status: Home / Self Care | Payer: Medicaid Other | Source: Ambulatory Visit | Attending: Radiation Oncology | Admitting: Radiation Oncology

## 2017-12-11 DIAGNOSIS — C50411 Malignant neoplasm of upper-outer quadrant of right female breast: Secondary | ICD-10-CM | POA: Diagnosis not present

## 2017-12-15 ENCOUNTER — Ambulatory Visit
Admission: RE | Admit: 2017-12-15 | Discharge: 2017-12-15 | Disposition: A | Discharge status: Home / Self Care | Payer: Medicaid Other | Source: Ambulatory Visit | Attending: Radiation Oncology | Admitting: Radiation Oncology

## 2017-12-15 DIAGNOSIS — C50411 Malignant neoplasm of upper-outer quadrant of right female breast: Secondary | ICD-10-CM | POA: Diagnosis not present

## 2017-12-16 ENCOUNTER — Ambulatory Visit
Admission: RE | Admit: 2017-12-16 | Discharge: 2017-12-16 | Disposition: A | Discharge status: Home / Self Care | Payer: Medicaid Other | Source: Ambulatory Visit | Attending: Radiation Oncology | Admitting: Radiation Oncology

## 2017-12-16 ENCOUNTER — Inpatient Hospital Stay: Payer: Medicaid Other

## 2017-12-16 DIAGNOSIS — C50411 Malignant neoplasm of upper-outer quadrant of right female breast: Secondary | ICD-10-CM | POA: Diagnosis not present

## 2017-12-16 DIAGNOSIS — Z170 Estrogen receptor positive status [ER+]: Principal | ICD-10-CM

## 2017-12-16 LAB — CBC
HEMATOCRIT: 37.2 % (ref 35.0–47.0)
Hemoglobin: 12.2 g/dL (ref 12.0–16.0)
MCH: 23.4 pg — AB (ref 26.0–34.0)
MCHC: 32.8 g/dL (ref 32.0–36.0)
MCV: 71.4 fL — AB (ref 80.0–100.0)
PLATELETS: 197 10*3/uL (ref 150–440)
RBC: 5.21 MIL/uL — AB (ref 3.80–5.20)
RDW: 15.3 % — ABNORMAL HIGH (ref 11.5–14.5)
WBC: 6.8 10*3/uL (ref 3.6–11.0)

## 2017-12-17 ENCOUNTER — Ambulatory Visit
Admission: RE | Admit: 2017-12-17 | Discharge: 2017-12-17 | Disposition: A | Discharge status: Home / Self Care | Payer: Medicaid Other | Source: Ambulatory Visit | Attending: Radiation Oncology | Admitting: Radiation Oncology

## 2017-12-17 DIAGNOSIS — C50411 Malignant neoplasm of upper-outer quadrant of right female breast: Secondary | ICD-10-CM | POA: Diagnosis not present

## 2017-12-18 ENCOUNTER — Ambulatory Visit
Admission: RE | Admit: 2017-12-18 | Discharge: 2017-12-18 | Disposition: A | Discharge status: Home / Self Care | Payer: Medicaid Other | Source: Ambulatory Visit | Attending: Radiation Oncology | Admitting: Radiation Oncology

## 2017-12-18 DIAGNOSIS — C50411 Malignant neoplasm of upper-outer quadrant of right female breast: Secondary | ICD-10-CM | POA: Diagnosis not present

## 2017-12-21 ENCOUNTER — Ambulatory Visit
Admission: RE | Admit: 2017-12-21 | Discharge: 2017-12-21 | Disposition: A | Discharge status: Home / Self Care | Payer: Medicaid Other | Source: Ambulatory Visit | Attending: Radiation Oncology | Admitting: Radiation Oncology

## 2017-12-21 DIAGNOSIS — Z170 Estrogen receptor positive status [ER+]: Secondary | ICD-10-CM | POA: Insufficient documentation

## 2017-12-21 DIAGNOSIS — Z510 Encounter for antineoplastic radiation therapy: Secondary | ICD-10-CM | POA: Diagnosis present

## 2017-12-21 DIAGNOSIS — C50411 Malignant neoplasm of upper-outer quadrant of right female breast: Secondary | ICD-10-CM | POA: Insufficient documentation

## 2017-12-21 DIAGNOSIS — Z9221 Personal history of antineoplastic chemotherapy: Secondary | ICD-10-CM | POA: Diagnosis not present

## 2017-12-21 DIAGNOSIS — K219 Gastro-esophageal reflux disease without esophagitis: Secondary | ICD-10-CM | POA: Insufficient documentation

## 2017-12-21 DIAGNOSIS — I10 Essential (primary) hypertension: Secondary | ICD-10-CM | POA: Diagnosis not present

## 2017-12-21 DIAGNOSIS — D649 Anemia, unspecified: Secondary | ICD-10-CM | POA: Insufficient documentation

## 2017-12-22 ENCOUNTER — Ambulatory Visit
Admission: RE | Admit: 2017-12-22 | Discharge: 2017-12-22 | Disposition: A | Discharge status: Home / Self Care | Payer: Medicaid Other | Source: Ambulatory Visit | Attending: Radiation Oncology | Admitting: Radiation Oncology

## 2017-12-22 DIAGNOSIS — Z510 Encounter for antineoplastic radiation therapy: Secondary | ICD-10-CM | POA: Diagnosis not present

## 2017-12-23 ENCOUNTER — Ambulatory Visit
Admission: RE | Admit: 2017-12-23 | Discharge: 2017-12-23 | Disposition: A | Discharge status: Home / Self Care | Payer: Medicaid Other | Source: Ambulatory Visit | Attending: Radiation Oncology | Admitting: Radiation Oncology

## 2017-12-23 DIAGNOSIS — Z510 Encounter for antineoplastic radiation therapy: Secondary | ICD-10-CM | POA: Diagnosis not present

## 2017-12-24 ENCOUNTER — Ambulatory Visit
Admission: RE | Admit: 2017-12-24 | Discharge: 2017-12-24 | Disposition: A | Discharge status: Home / Self Care | Payer: Medicaid Other | Source: Ambulatory Visit | Attending: Radiation Oncology | Admitting: Radiation Oncology

## 2017-12-24 DIAGNOSIS — Z510 Encounter for antineoplastic radiation therapy: Secondary | ICD-10-CM | POA: Diagnosis not present

## 2017-12-25 ENCOUNTER — Ambulatory Visit
Admission: RE | Admit: 2017-12-25 | Discharge: 2017-12-25 | Disposition: A | Discharge status: Home / Self Care | Payer: Medicaid Other | Source: Ambulatory Visit | Attending: Radiation Oncology | Admitting: Radiation Oncology

## 2017-12-25 DIAGNOSIS — Z510 Encounter for antineoplastic radiation therapy: Secondary | ICD-10-CM | POA: Diagnosis not present

## 2017-12-28 ENCOUNTER — Ambulatory Visit
Admission: RE | Admit: 2017-12-28 | Discharge: 2017-12-28 | Disposition: A | Discharge status: Home / Self Care | Payer: Medicaid Other | Source: Ambulatory Visit | Attending: Radiation Oncology | Admitting: Radiation Oncology

## 2017-12-28 DIAGNOSIS — Z510 Encounter for antineoplastic radiation therapy: Secondary | ICD-10-CM | POA: Diagnosis not present

## 2017-12-29 ENCOUNTER — Ambulatory Visit
Admission: RE | Admit: 2017-12-29 | Discharge: 2017-12-29 | Disposition: A | Discharge status: Home / Self Care | Payer: Medicaid Other | Source: Ambulatory Visit | Attending: Radiation Oncology | Admitting: Radiation Oncology

## 2017-12-29 DIAGNOSIS — Z510 Encounter for antineoplastic radiation therapy: Secondary | ICD-10-CM | POA: Diagnosis not present

## 2017-12-30 ENCOUNTER — Encounter: Payer: Self-pay | Admitting: *Deleted

## 2017-12-30 ENCOUNTER — Inpatient Hospital Stay: Payer: Medicaid Other | Attending: Oncology

## 2017-12-30 ENCOUNTER — Ambulatory Visit
Admission: RE | Admit: 2017-12-30 | Discharge: 2017-12-30 | Disposition: A | Discharge status: Home / Self Care | Payer: Medicaid Other | Source: Ambulatory Visit | Attending: Radiation Oncology | Admitting: Radiation Oncology

## 2017-12-30 ENCOUNTER — Other Ambulatory Visit: Payer: Self-pay | Admitting: Oncology

## 2017-12-30 DIAGNOSIS — Z510 Encounter for antineoplastic radiation therapy: Secondary | ICD-10-CM | POA: Diagnosis not present

## 2017-12-30 DIAGNOSIS — R531 Weakness: Secondary | ICD-10-CM | POA: Insufficient documentation

## 2017-12-30 DIAGNOSIS — C50411 Malignant neoplasm of upper-outer quadrant of right female breast: Secondary | ICD-10-CM

## 2017-12-30 DIAGNOSIS — D649 Anemia, unspecified: Secondary | ICD-10-CM | POA: Diagnosis not present

## 2017-12-30 DIAGNOSIS — G4700 Insomnia, unspecified: Secondary | ICD-10-CM | POA: Insufficient documentation

## 2017-12-30 DIAGNOSIS — Z170 Estrogen receptor positive status [ER+]: Principal | ICD-10-CM

## 2017-12-30 DIAGNOSIS — G629 Polyneuropathy, unspecified: Secondary | ICD-10-CM | POA: Insufficient documentation

## 2017-12-30 DIAGNOSIS — R5383 Other fatigue: Secondary | ICD-10-CM | POA: Insufficient documentation

## 2017-12-30 LAB — COMPREHENSIVE METABOLIC PANEL
ALBUMIN: 3.7 g/dL (ref 3.5–5.0)
ALT: 13 U/L — ABNORMAL LOW (ref 14–54)
AST: 18 U/L (ref 15–41)
Alkaline Phosphatase: 106 U/L (ref 38–126)
Anion gap: 7 (ref 5–15)
BUN: 11 mg/dL (ref 6–20)
CHLORIDE: 106 mmol/L (ref 101–111)
CO2: 26 mmol/L (ref 22–32)
Calcium: 9.1 mg/dL (ref 8.9–10.3)
Creatinine, Ser: 0.68 mg/dL (ref 0.44–1.00)
GFR calc Af Amer: >60 mL/min (ref >60)
Glucose, Bld: 106 mg/dL — ABNORMAL HIGH (ref 65–99)
POTASSIUM: 3.7 mmol/L (ref 3.5–5.1)
Sodium: 139 mmol/L (ref 135–145)
Total Bilirubin: 0.3 mg/dL (ref 0.3–1.2)
Total Protein: 6.7 g/dL (ref 6.5–8.1)

## 2017-12-30 LAB — CBC
HEMATOCRIT: 35.2 % (ref 35.0–47.0)
HEMOGLOBIN: 11.8 g/dL — AB (ref 12.0–16.0)
MCH: 23.6 pg — ABNORMAL LOW (ref 26.0–34.0)
MCHC: 33.6 g/dL (ref 32.0–36.0)
MCV: 70.3 fL — AB (ref 80.0–100.0)
Platelets: 235 10*3/uL (ref 150–440)
RBC: 5.01 MIL/uL (ref 3.80–5.20)
RDW: 15.8 % — AB (ref 11.5–14.5)
WBC: 6.9 10*3/uL (ref 3.6–11.0)

## 2017-12-30 MED ORDER — SODIUM CHLORIDE 0.9% FLUSH
10.0000 mL | Freq: Once | INTRAVENOUS | Status: AC
  Administered 2017-12-30: 10 mL via INTRAVENOUS
  Filled 2017-12-30: qty 10

## 2017-12-30 MED ORDER — HEPARIN SOD (PORK) LOCK FLUSH 100 UNIT/ML IV SOLN
500.0000 [IU] | Freq: Once | INTRAVENOUS | Status: AC
  Administered 2017-12-30: 500 [IU] via INTRAVENOUS

## 2017-12-31 ENCOUNTER — Ambulatory Visit
Admission: RE | Admit: 2017-12-31 | Discharge: 2017-12-31 | Disposition: A | Discharge status: Home / Self Care | Payer: Medicaid Other | Source: Ambulatory Visit | Attending: Radiation Oncology | Admitting: Radiation Oncology

## 2017-12-31 ENCOUNTER — Other Ambulatory Visit: Payer: Self-pay | Admitting: *Deleted

## 2017-12-31 DIAGNOSIS — Z510 Encounter for antineoplastic radiation therapy: Secondary | ICD-10-CM | POA: Diagnosis not present

## 2017-12-31 MED ORDER — SILVER SULFADIAZINE 1 % EX CREA: 1.0000 "application " | TOPICAL_CREAM | Freq: Two times a day (BID) | CUTANEOUS | 3 refills | Status: DC

## 2017-12-31 MED ORDER — TRAMADOL HCL 50 MG PO TABS: 50.0000 mg | ORAL_TABLET | Freq: Four times a day (QID) | ORAL | 0 refills | Status: DC | PRN

## 2018-01-01 ENCOUNTER — Ambulatory Visit
Admission: RE | Admit: 2018-01-01 | Discharge: 2018-01-01 | Disposition: A | Discharge status: Home / Self Care | Payer: Medicaid Other | Source: Ambulatory Visit | Attending: Radiation Oncology | Admitting: Radiation Oncology

## 2018-01-01 DIAGNOSIS — Z510 Encounter for antineoplastic radiation therapy: Secondary | ICD-10-CM | POA: Diagnosis not present

## 2018-01-03 NOTE — Progress Notes (Signed)
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  INTERVAL HISTORY: Patient returns to clinic today for repeat laboratory work and to initiate an aromatase inhibitor.  She has some skin damage and tenderness from her XRT, but otherwise is tolerating it well.  She continues to be anxious.  She has no neurologic complaints. She continues to have chronic weakness and fatigue, but remains active. She has a good appetite and denies weight loss. She has no chest pain or shortness of breath. She denies any nausea, vomiting, constipation, or diarrhea. She has no urinary complaints.  Patient offers no further specific complaints today.  REVIEW OF SYSTEMS:   Review of Systems  Constitutional: Positive for malaise/fatigue. Negative for fever and weight loss.  Respiratory: Negative.  Negative for cough and shortness of breath.   Cardiovascular: Negative.  Negative for chest pain and leg swelling.  Gastrointestinal: Negative.  Negative for abdominal pain.  Genitourinary: Negative.   Musculoskeletal: Negative.  Negative for joint pain.  Skin: Negative.  Negative for itching and rash.  Neurological: Negative.  Negative for tingling, sensory change, focal weakness and weakness.  Psychiatric/Behavioral: The patient is nervous/anxious and has insomnia.     As per HPI. Otherwise, a complete review of systems is negative.  PAST MEDICAL HISTORY: Past Medical History:  Diagnosis Date  . Anemia   . Arthritis   . Cancer ([place])   . Dyspnea    SINCE STARTING CHEMO WITH EXERTION  . GERD (gastroesophageal reflux disease)    NO MEDS  .  Heart murmur    DX IN TEENS-ASYMPTOMATIC  . Hypertension   . Migraines    H/O MIGRAINES  . Personal history of chemotherapy 2018   finished 09/08/17  . Thumb fracture    LEFT-WEARING BRACE    PAST SURGICAL HISTORY: Past Surgical History:  Procedure Laterality Date  . BREAST BIOPSY Right 03/19/2017   US guided biopsy of right breast and right lymph node/ positive  . BREAST LUMPECTOMY Right 10/14/2017  . CESAREAN SECTION    . FRACTURE SURGERY     left hand  . PARTIAL MASTECTOMY WITH NEEDLE LOCALIZATION Right 10/14/2017   Procedure: PARTIAL MASTECTOMY WITH NEEDLE LOCALIZATION;  Surgeon: Herbert Pun, MD;  Location: [facility] ORS;  Service: General;  Laterality: Right;  . PILONIDAL CYST EXCISION  1980'S  . PORT-A-CATH REMOVAL N/A 04/27/2017   Procedure: REMOVAL OF PORT A CATH & insertion PORT A CATH LEFT INTERNAL JUGULAR;  Surgeon: Herbert Pun, MD;  Location: [facility] ORS;  Service: General;  Laterality: N/A;  . PORTACATH PLACEMENT N/A 04/15/2017   Procedure: INSERTION PORT-A-CATH;  Surgeon: Herbert Pun, MD;  Location: [facility] ORS;  Service: General;  Laterality: N/A;  . SENTINEL NODE BIOPSY Right 10/14/2017   Procedure: SENTINEL NODE BIOPSY;  Surgeon: Herbert Pun, MD;  Location: [facility] ORS;  Service: General;  Laterality: Right;    FAMILY HISTORY: Family History  Problem Relation Age of Onset  . Breast cancer Maternal Grandmother   . Hypertension Maternal Grandmother   . Angina Mother   . Lung cancer Father   . Diabetes Father   . Prostate cancer Maternal Uncle   . Dementia Maternal Grandfather   . Congestive Heart  Failure Maternal Grandfather   . Heart disease Paternal Grandmother     ADVANCED DIRECTIVES (Y/N):  N  HEALTH MAINTENANCE: Social History   Tobacco Use  . Smoking status: Former Smoker    Packs/day: 2.00    Years: 18.00    Pack years: 36.00    Types: Cigarettes    Last attempt to quit: 05/01/2017    Years since quitting: 0.6  .  Smokeless tobacco: Never Used  . Tobacco comment: quit 4 months ago  Substance Use Topics  . Alcohol use: No  . Drug use: No     Colonoscopy:  PAP:  Bone density:  Lipid panel:  Allergies  Allergen Reactions  . Hydrocodone-Acetaminophen Itching and Rash  . Penicillins Swelling, Other (See Comments) and Rash    Throat swelling Has patient had a PCN reaction causing immediate rash, facial/tongue/throat swelling, SOB or lightheadedness with hypotension: Yes Has patient had a PCN reaction causing severe rash involving mucus membranes or skin necrosis: No Has patient had a PCN reaction that required hospitalization: No Has patient had a PCN reaction occurring within the last 10 years: No If all of the above answers are "NO", then may proceed with Cephalosporin use.     Current Outpatient Medications  Medication Sig Dispense Refill  . ALPRAZolam (XANAX) 0.25 MG tablet Take 1 tablet (0.25 mg total) by mouth at bedtime as needed for anxiety. 30 tablet 1  . bisoprolol-hydrochlorothiazide (ZIAC) 5-6.25 MG tablet Take 1 tablet by mouth every morning.     Marland Kitchen buPROPion (WELLBUTRIN SR) 200 MG 12 hr tablet Take 200 mg by mouth 2 (two) times daily.    . Calcium-Magnesium-Zinc (CAL-MAG-ZINC PO) Take 1 tablet by mouth daily.    . diclofenac sodium (VOLTAREN) 1 % GEL Apply 2 g topically 4 (four) times daily as needed (for pain in right thumb).    . ferrous sulfate 325 (65 FE) MG tablet Take 325 mg by mouth daily with breakfast.    . gabapentin (NEURONTIN) 300 MG capsule TAKE 1 CAPSULE BY MOUTH THREE TIMES A DAY 270 capsule 1  . lidocaine-prilocaine (EMLA) cream Apply to affected area once (Patient taking differently: Apply 1 application topically daily as needed (prior to port being accessed.). Apply to affected area once) 30 g 3  . Multiple Vitamins-Minerals (ADULT GUMMY PO) Take 2 tablets by mouth daily. One-A-Day Women's Multivitamin Gummies    . silver sulfADIAZINE (SILVADENE) 1 % cream Apply 1  application topically 2 (two) times daily. 50 g 3  . traMADol (ULTRAM) 50 MG tablet Take 1 tablet (50 mg total) by mouth every 6 (six) hours as needed (for pain.). 30 tablet 0  . vitamin C (ASCORBIC ACID) 500 MG tablet Take 500 mg by mouth daily.    Marland Kitchen acetaminophen (TYLENOL) 500 MG tablet Take 1,000 mg by mouth every 8 (eight) hours as needed for mild pain.    Marland Kitchen letrozole (FEMARA) 2.5 MG tablet Take 1 tablet (2.5 mg total) by mouth daily. 30 tablet 3  . loratadine (CLARITIN) 10 MG tablet Take 10 mg by mouth daily as needed for allergies.    Marland Kitchen ondansetron (ZOFRAN) 8 MG tablet Take 1 tablet (8 mg total) by mouth 2 (two) times daily as needed. (Patient not taking: Reported on 01/05/2018) 60 tablet 2  . prochlorperazine (COMPAZINE) 10 MG tablet Take 1 tablet (10 mg total) by mouth every 6 (six) hours as needed (Nausea or vomiting). (Patient not taking: Reported on 01/05/2018) 60 tablet 2  . promethazine (PHENERGAN)  25 MG suppository Place 1 suppository (25 mg total) rectally every 6 (six) hours as needed for nausea or vomiting. (Patient not taking: Reported on 01/05/2018) 30 each 0   No current facility-administered medications for this visit.    Facility-Administered Medications Ordered in Other Visits  Medication Dose Route Frequency Provider Last Rate Last Dose  . sodium chloride flush (NS) 0.9 % injection 10 mL  10 mL Intracatheter PRN Lloyd Huger, MD   10 mL at 07/15/17 0855    OBJECTIVE: Vitals:   01/05/18 1134  BP: (!) 141/70  Pulse: (!) 59  Resp: 18  Temp: (!) 95.8 F (35.4 C)     Body mass index is 34.76 kg/m.    ECOG FS:0 - Asymptomatic  General: Well-developed, well-nourished, no acute distress. Eyes: Pink conjunctiva, anicteric sclera. Breast: Right breast and axilla with skin erythema and tenderness, but no ulceration. Lungs: Clear to auscultation bilaterally. Heart: Regular rate and rhythm. No rubs, murmurs, or gallops. Abdomen: Soft, nontender, nondistended. No  organomegaly noted, normoactive bowel sounds. Musculoskeletal: No edema, cyanosis, or clubbing. Neuro: Alert, answering all questions appropriately. Cranial nerves grossly intact. Skin: No rashes or petechiae noted. Psych: Normal affect.  LAB RESULTS:  Lab Results  Component Value Date   NA 139 12/30/2017   K 3.7 12/30/2017   CL 106 12/30/2017   CO2 26 12/30/2017   GLUCOSE 106 (H) 12/30/2017   BUN 11 12/30/2017   CREATININE 0.68 12/30/2017   CALCIUM 9.1 12/30/2017   PROT 6.7 12/30/2017   ALBUMIN 3.7 12/30/2017   AST 18 12/30/2017   ALT 13 (L) 12/30/2017   ALKPHOS 106 12/30/2017   BILITOT 0.3 12/30/2017   GFRNONAA >60 12/30/2017   GFRAA >60 12/30/2017    Lab Results  Component Value Date   WBC 6.9 12/30/2017   NEUTROABS 4.6 12/10/2017   HGB 11.8 (L) 12/30/2017   HCT 35.2 12/30/2017   MCV 70.3 (L) 12/30/2017   PLT 235 12/30/2017     STUDIES: No results found.  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  PLAN:    1. 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  She then underwent partial mastectomy on October 14, 2017.  Patient noted to have residual disease, therefore proceeded with adjuvant XRT which she completed on January 06, 2018.  Patient was given a prescription for letrozole today which she will take for a total of 5 years completing in June 2024.  We will get a baseline bone marrow density in the next 1 to 2 weeks.  Return to clinic in 3 months for routine evaluation.   2. Right hand fracture: Continue monitoring and treatment per orthopedics.  Continue with injections for pain as needed.  Case previously discussed with Dr. Grandville Silos.   3.  Anemia: Hemoglobin is decreased, but stable.  Monitor. 4.   Insomnia/anxiety: Continue Xanax as needed. 5.  Neuropathy: Patient does not complain of this today. 6.  Skin damage: Secondary to XRT.  Continue topical treatments per radiation oncology.  I spent a total of 30 minutes face-to-face with the patient of which greater than 50% of the visit was spent in counseling and coordination of care as summarized above.  Patient expressed understanding and was in agreement with this plan. She also understands that She can call  clinic at any time with any questions, concerns, or complaints.   Cancer Staging Primary cancer of upper outer quadrant of right female breast [facility]) Staging form: Breast, AJCC 8th Edition - Clinical stage from 03/27/2017: Stage IB (cT1c, cN1, cM0, G2, ER: Positive, PR: Positive, HER2: Negative) - Signed by Lloyd Huger, MD on 03/27/2017 - Pathologic stage from 10/22/2017: No Stage Recommended (ypT1c, pN1a, cM0, G2, ER+, PR+, HER2-) - Signed by Lloyd Huger, MD on 10/22/2017   Lloyd Huger, MD   01/10/2018 7:44 AM

## 2018-01-04 ENCOUNTER — Ambulatory Visit
Admission: RE | Admit: 2018-01-04 | Discharge: 2018-01-04 | Disposition: A | Discharge status: Home / Self Care | Payer: Medicaid Other | Source: Ambulatory Visit | Attending: Radiation Oncology | Admitting: Radiation Oncology

## 2018-01-04 DIAGNOSIS — Z510 Encounter for antineoplastic radiation therapy: Secondary | ICD-10-CM | POA: Diagnosis not present

## 2018-01-05 ENCOUNTER — Other Ambulatory Visit: Payer: Self-pay

## 2018-01-05 ENCOUNTER — Inpatient Hospital Stay (HOSPITAL_BASED_OUTPATIENT_CLINIC_OR_DEPARTMENT_OTHER): Payer: Medicaid Other | Admitting: Oncology

## 2018-01-05 ENCOUNTER — Ambulatory Visit
Admission: RE | Admit: 2018-01-05 | Discharge: 2018-01-05 | Disposition: A | Discharge status: Home / Self Care | Payer: Medicaid Other | Source: Ambulatory Visit | Attending: Radiation Oncology | Admitting: Radiation Oncology

## 2018-01-05 VITALS — BP 141/70 | HR 59 | Temp 95.8°F | Resp 18 | Wt 208.9 lb

## 2018-01-05 DIAGNOSIS — D649 Anemia, unspecified: Secondary | ICD-10-CM

## 2018-01-05 DIAGNOSIS — G4700 Insomnia, unspecified: Secondary | ICD-10-CM | POA: Diagnosis not present

## 2018-01-05 DIAGNOSIS — R531 Weakness: Secondary | ICD-10-CM

## 2018-01-05 DIAGNOSIS — Z510 Encounter for antineoplastic radiation therapy: Secondary | ICD-10-CM | POA: Diagnosis not present

## 2018-01-05 DIAGNOSIS — G629 Polyneuropathy, unspecified: Secondary | ICD-10-CM | POA: Diagnosis not present

## 2018-01-05 DIAGNOSIS — Z170 Estrogen receptor positive status [ER+]: Secondary | ICD-10-CM | POA: Diagnosis not present

## 2018-01-05 DIAGNOSIS — R5383 Other fatigue: Secondary | ICD-10-CM | POA: Diagnosis not present

## 2018-01-05 DIAGNOSIS — C50411 Malignant neoplasm of upper-outer quadrant of right female breast: Secondary | ICD-10-CM

## 2018-01-05 MED ORDER — LETROZOLE 2.5 MG PO TABS: 2.5000 mg | ORAL_TABLET | Freq: Every day | ORAL | 3 refills | Status: DC

## 2018-01-05 NOTE — Progress Notes (Signed)
Here after radiation to R breast underarm area. Stated feeling tired and burning pain. Overall attitude is good she voiced.

## 2018-01-06 ENCOUNTER — Ambulatory Visit: Payer: Medicaid Other

## 2018-01-06 ENCOUNTER — Ambulatory Visit
Admission: RE | Admit: 2018-01-06 | Discharge: 2018-01-06 | Disposition: A | Discharge status: Home / Self Care | Payer: Medicaid Other | Source: Ambulatory Visit | Attending: Radiation Oncology | Admitting: Radiation Oncology

## 2018-01-06 DIAGNOSIS — Z510 Encounter for antineoplastic radiation therapy: Secondary | ICD-10-CM | POA: Diagnosis not present

## 2018-01-07 ENCOUNTER — Ambulatory Visit
Admission: RE | Admit: 2018-01-07 | Discharge: 2018-01-07 | Disposition: A | Discharge status: Home / Self Care | Payer: Medicaid Other | Source: Ambulatory Visit | Attending: Radiation Oncology | Admitting: Radiation Oncology

## 2018-01-07 DIAGNOSIS — Z510 Encounter for antineoplastic radiation therapy: Secondary | ICD-10-CM | POA: Diagnosis not present

## 2018-01-11 ENCOUNTER — Telehealth: Payer: Self-pay | Admitting: *Deleted

## 2018-01-11 NOTE — Telephone Encounter (Signed)
Now is fine!  Thanks!

## 2018-01-11 NOTE — Telephone Encounter (Signed)
Patient asking when she is to start AI therapy, she has now completed radiation therapy dan cannot remember when she was told to start Letrozole. Please advise

## 2018-01-12 NOTE — Telephone Encounter (Signed)
Patient instructed per Dr Grayland Ormond to start Letrozole now. She repeated back to me

## 2018-02-04 ENCOUNTER — Ambulatory Visit
Admission: RE | Admit: 2018-02-04 | Discharge: 2018-02-04 | Disposition: A | Discharge status: Home / Self Care | Payer: Medicaid Other | Source: Ambulatory Visit | Attending: Oncology | Admitting: Oncology

## 2018-02-04 DIAGNOSIS — C50411 Malignant neoplasm of upper-outer quadrant of right female breast: Secondary | ICD-10-CM | POA: Diagnosis present

## 2018-02-18 ENCOUNTER — Inpatient Hospital Stay: Payer: Medicaid Other | Attending: Oncology

## 2018-02-18 ENCOUNTER — Ambulatory Visit
Admission: RE | Admit: 2018-02-18 | Discharge: 2018-02-18 | Disposition: A | Discharge status: Home / Self Care | Payer: Medicaid Other | Source: Ambulatory Visit | Attending: Radiation Oncology | Admitting: Radiation Oncology

## 2018-02-18 ENCOUNTER — Other Ambulatory Visit: Payer: Self-pay

## 2018-02-18 ENCOUNTER — Encounter: Payer: Self-pay | Admitting: Radiation Oncology

## 2018-02-18 ENCOUNTER — Encounter: Payer: Self-pay | Admitting: *Deleted

## 2018-02-18 VITALS — BP 151/91 | HR 75 | Temp 97.2°F | Wt 221.6 lb

## 2018-02-18 DIAGNOSIS — M8580 Other specified disorders of bone density and structure, unspecified site: Secondary | ICD-10-CM | POA: Insufficient documentation

## 2018-02-18 DIAGNOSIS — Z923 Personal history of irradiation: Secondary | ICD-10-CM | POA: Insufficient documentation

## 2018-02-18 DIAGNOSIS — Z79811 Long term (current) use of aromatase inhibitors: Secondary | ICD-10-CM | POA: Diagnosis not present

## 2018-02-18 DIAGNOSIS — Z9221 Personal history of antineoplastic chemotherapy: Secondary | ICD-10-CM | POA: Insufficient documentation

## 2018-02-18 DIAGNOSIS — Z006 Encounter for examination for normal comparison and control in clinical research program: Secondary | ICD-10-CM | POA: Diagnosis present

## 2018-02-18 DIAGNOSIS — C50411 Malignant neoplasm of upper-outer quadrant of right female breast: Secondary | ICD-10-CM

## 2018-02-18 DIAGNOSIS — G629 Polyneuropathy, unspecified: Secondary | ICD-10-CM | POA: Insufficient documentation

## 2018-02-18 DIAGNOSIS — F419 Anxiety disorder, unspecified: Secondary | ICD-10-CM | POA: Diagnosis not present

## 2018-02-18 DIAGNOSIS — Z170 Estrogen receptor positive status [ER+]: Secondary | ICD-10-CM | POA: Diagnosis not present

## 2018-02-18 DIAGNOSIS — C50911 Malignant neoplasm of unspecified site of right female breast: Secondary | ICD-10-CM | POA: Insufficient documentation

## 2018-02-18 DIAGNOSIS — Z452 Encounter for adjustment and management of vascular access device: Secondary | ICD-10-CM | POA: Diagnosis present

## 2018-02-18 DIAGNOSIS — N951 Menopausal and female climacteric states: Secondary | ICD-10-CM | POA: Insufficient documentation

## 2018-02-18 DIAGNOSIS — F329 Major depressive disorder, single episode, unspecified: Secondary | ICD-10-CM | POA: Diagnosis not present

## 2018-02-18 DIAGNOSIS — L819 Disorder of pigmentation, unspecified: Secondary | ICD-10-CM | POA: Diagnosis not present

## 2018-02-18 DIAGNOSIS — Z79899 Other long term (current) drug therapy: Secondary | ICD-10-CM | POA: Diagnosis not present

## 2018-02-18 DIAGNOSIS — Z08 Encounter for follow-up examination after completed treatment for malignant neoplasm: Secondary | ICD-10-CM | POA: Diagnosis present

## 2018-02-18 DIAGNOSIS — G4700 Insomnia, unspecified: Secondary | ICD-10-CM | POA: Insufficient documentation

## 2018-02-18 MED ORDER — HEPARIN SOD (PORK) LOCK FLUSH 100 UNIT/ML IV SOLN
500.0000 [IU] | Freq: Once | INTRAVENOUS | Status: AC
  Administered 2018-02-18: 500 [IU] via INTRAVENOUS

## 2018-02-18 MED ORDER — SODIUM CHLORIDE 0.9% FLUSH
10.0000 mL | Freq: Once | INTRAVENOUS | Status: AC
  Administered 2018-02-18: 10 mL via INTRAVENOUS
  Filled 2018-02-18: qty 10

## 2018-02-18 NOTE — Progress Notes (Signed)
Radiation Oncology Follow up Note  Name: Destiny Rogers   Date:   02/18/2018 MRN:  [id number] DOB: 12/31/1960    This 57 y.o. female presents to the clinic today for one-month follow-up status post.whole breast radiation to her right breast for stage IIa invasive mammary carcinoma ER/PR positive  REFERRING PROVIDER: Maryland Pink, MD  HPI: patient is a 57 year old female now seen out 1 month having completed whole breast radiation to her right breast for stage IIa invasive mammary carcinoma ER/PR positive HER-2/neu not overexpressed. She had neoadjuvant chemotherapy followed by whole breast radiation. She is seen today in routine follow-up is doing well. She still has a slight scab in the central portion of her scar. She specifically denies breast tenderness cough or bone pain. She has started.Femara and is tolerating that well without side effect.  COMPLICATIONS OF TREATMENT: none  FOLLOW UP COMPLIANCE: keeps appointments   PHYSICAL EXAM:  BP (!) 151/91   Pulse 75   Temp (!) 97.2 F (36.2 C)   Wt 221 lb 9 oz (100.5 kg)   LMP 04/09/2005 Comment: Tubal Ligation  BMI 36.87 kg/m  She has a small scabin the central portion of herright breast scar. Otherwise no dominant mass or nodularity is noted in either breast in 2 positions examined. No axillary or supraclavicular adenopathy is appreciated.Well-developed well-nourished patient in NAD. HEENT reveals PERLA, EOMI, discs not visualized.  Oral cavity is clear. No oral mucosal lesions are identified. Neck is clear without evidence of cervical or supraclavicular adenopathy. Lungs are clear to A&P. Cardiac examination is essentially unremarkable with regular rate and rhythm without murmur rub or thrill. Abdomen is benign with no organomegaly or masses noted. Motor sensory and DTR levels are equal and symmetric in the upper and lower extremities. Cranial nerves II through XII are grossly intact. Proprioception is intact. No peripheral adenopathy or  edema is identified. No motor or sensory levels are noted. Crude visual fields are within normal range.  RADIOLOGY RESULTS: no current films to review  PLAN: present time she is doing well. I'm please were overall progress I've assured her the hyperpigmentation skin will continue to resolve. She continues on Femara without side effect. She knows to call with any concerns at any time. I've asked to see her back in 4-5 months for follow-up.  I would like to take this opportunity to thank you for allowing me to participate in the care of your patient.Noreene Filbert, MD

## 2018-02-28 NOTE — Progress Notes (Addendum)
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  INTERVAL HISTORY: Patient returns to clinic today for further evaluation and enrollment on clinical trial.  She is noted increasing hot flashes since starting letrozole, but otherwise feels well.  She continues to be anxious. She has no neurologic complaints.  She does not complain of weakness or fatigue today.  She has a good appetite and denies weight loss. She has no chest pain or shortness of breath. She denies any nausea, vomiting, constipation, or diarrhea. She has no urinary complaints.  Patient offers no further specific complaints today.  REVIEW OF SYSTEMS:   Review of Systems  Constitutional: Negative.  Negative for fever, malaise/fatigue and weight loss.  Respiratory: Negative.  Negative for cough and shortness of breath.   Cardiovascular: Negative.  Negative for chest pain and leg swelling.  Gastrointestinal: Negative.  Negative for abdominal pain.  Genitourinary: Negative.  Negative for dysuria.  Musculoskeletal: Negative.  Negative for joint pain.  Skin: Negative.  Negative for itching and rash.  Neurological: Positive for sensory change. Negative for tingling, focal weakness and weakness.  Psychiatric/Behavioral: The patient is nervous/anxious. The patient does not have insomnia.     As per HPI. Otherwise, a complete review of systems is negative.  PAST MEDICAL HISTORY: Past Medical History:  Diagnosis Date  . Anemia   . Arthritis   . Cancer ([place])   . Dyspnea    SINCE STARTING CHEMO WITH EXERTION  . GERD (gastroesophageal reflux disease)    NO  MEDS  . Heart murmur    DX IN TEENS-ASYMPTOMATIC  . Hypertension   . Migraines    H/O MIGRAINES  . Personal history of chemotherapy 2018   finished 09/08/17  . Thumb fracture    LEFT-WEARING BRACE    PAST SURGICAL HISTORY: Past Surgical History:  Procedure Laterality Date  . BREAST BIOPSY Right 03/19/2017   US guided biopsy of right breast and right lymph node/ positive  . BREAST LUMPECTOMY Right 10/14/2017  . CESAREAN SECTION    . FRACTURE SURGERY     left hand  . PARTIAL MASTECTOMY WITH NEEDLE LOCALIZATION Right 10/14/2017   Procedure: PARTIAL MASTECTOMY WITH NEEDLE LOCALIZATION;  Surgeon: Herbert Pun, MD;  Location: [facility] ORS;  Service: General;  Laterality: Right;  . PILONIDAL CYST EXCISION  1980'S  . PORT-A-CATH REMOVAL N/A 04/27/2017   Procedure: REMOVAL OF PORT A CATH & insertion PORT A CATH LEFT INTERNAL JUGULAR;  Surgeon: Herbert Pun, MD;  Location: [facility] ORS;  Service: General;  Laterality: N/A;  . PORTACATH PLACEMENT N/A 04/15/2017   Procedure: INSERTION PORT-A-CATH;  Surgeon: Herbert Pun, MD;  Location: [facility] ORS;  Service: General;  Laterality: N/A;  . SENTINEL NODE BIOPSY Right 10/14/2017   Procedure: SENTINEL NODE BIOPSY;  Surgeon: Herbert Pun, MD;  Location: [facility] ORS;  Service: General;  Laterality: Right;    FAMILY HISTORY: Family History  Problem Relation Age of Onset  . Breast cancer Maternal Grandmother   . Hypertension Maternal Grandmother   . Angina Mother   . Lung cancer Father   . Diabetes Father   . Prostate cancer Maternal Uncle   . Dementia Maternal Grandfather   . Congestive Heart  Failure Maternal Grandfather   . Heart disease Paternal Grandmother     ADVANCED DIRECTIVES (Y/N):  N  HEALTH MAINTENANCE: Social History   Tobacco Use  . Smoking status: Former Smoker    Packs/day: 2.00    Years: 18.00    Pack years: 36.00    Types: Cigarettes    Last attempt to quit: 05/01/2017    Years since quitting:  0.8  . Smokeless tobacco: Never Used  . Tobacco comment: quit 4 months ago  Substance Use Topics  . Alcohol use: No  . Drug use: No     Colonoscopy:  PAP:  Bone density:  Lipid panel:  Allergies  Allergen Reactions  . Hydrocodone-Acetaminophen Itching and Rash  . Penicillins Swelling, Other (See Comments) and Rash    Throat swelling Has patient had a PCN reaction causing immediate rash, facial/tongue/throat swelling, SOB or lightheadedness with hypotension: Yes Has patient had a PCN reaction causing severe rash involving mucus membranes or skin necrosis: No Has patient had a PCN reaction that required hospitalization: No Has patient had a PCN reaction occurring within the last 10 years: No If all of the above answers are "NO", then may proceed with Cephalosporin use.     Current Outpatient Medications  Medication Sig Dispense Refill  . acetaminophen (TYLENOL) 500 MG tablet Take 1,000 mg by mouth every 8 (eight) hours as needed for mild pain.    Marland Kitchen ALPRAZolam (XANAX) 0.25 MG tablet Take 1 tablet (0.25 mg total) by mouth at bedtime as needed for anxiety. 30 tablet 1  . buPROPion (WELLBUTRIN SR) 200 MG 12 hr tablet Take 200 mg by mouth 2 (two) times daily as needed.     . Calcium-Magnesium-Zinc (CAL-MAG-ZINC PO) Take 1 tablet by mouth daily.    Marland Kitchen gabapentin (NEURONTIN) 300 MG capsule TAKE 1 CAPSULE BY MOUTH THREE TIMES A DAY 270 capsule 1  . letrozole (FEMARA) 2.5 MG tablet Take 1 tablet (2.5 mg total) by mouth daily. 30 tablet 3  . lidocaine-prilocaine (EMLA) cream Apply to affected area once (Patient taking differently: Apply 1 application topically daily as needed (prior to port being accessed.). Apply to affected area once) 30 g 3  . Multiple Vitamins-Minerals (ADULT GUMMY PO) Take 2 tablets by mouth daily. One-A-Day Women's Multivitamin Gummies    . traMADol (ULTRAM) 50 MG tablet Take 1 tablet (50 mg total) by mouth every 6 (six) hours as needed (for pain.). 30 tablet 0  .  bisoprolol-hydrochlorothiazide (ZIAC) 5-6.25 MG tablet Take 1 tablet by mouth daily.     . diclofenac sodium (VOLTAREN) 1 % GEL Apply 2 g topically 4 (four) times daily as needed (for pain in right thumb).    . ferrous sulfate 325 (65 FE) MG tablet Take 325 mg by mouth daily with breakfast.    . ibuprofen (ADVIL,MOTRIN) 600 MG tablet TAKE 1 TABLET BY MOUTH EVERY 6HRS AS NEEDED FOR PAIN DISCONTINUE ORDER WHEN COMPLETED  0  . loratadine (CLARITIN) 10 MG tablet Take 10 mg by mouth daily as needed for allergies.    Marland Kitchen ondansetron (ZOFRAN) 8 MG tablet Take 1 tablet (8 mg total) by mouth 2 (two) times daily as needed. (Patient not taking: Reported on 03/02/2018) 60 tablet 2  . prochlorperazine (COMPAZINE) 10 MG tablet Take 1 tablet (10 mg total) by mouth every 6 (six) hours as needed (Nausea or vomiting). (Patient not taking: Reported on 03/02/2018) 60 tablet 2  . promethazine (PHENERGAN) 25 MG suppository Place 1 suppository (25 mg total) rectally  every 6 (six) hours as needed for nausea or vomiting. (Patient not taking: Reported on 03/02/2018) 30 each 0  . silver sulfADIAZINE (SILVADENE) 1 % cream Apply 1 application topically 2 (two) times daily. (Patient not taking: Reported on 03/02/2018) 50 g 3  . vitamin C (ASCORBIC ACID) 500 MG tablet Take 500 mg by mouth daily.     No current facility-administered medications for this visit.    Facility-Administered Medications Ordered in Other Visits  Medication Dose Route Frequency Provider Last Rate Last Dose  . sodium chloride flush (NS) 0.9 % injection 10 mL  10 mL Intracatheter PRN Lloyd Huger, MD   10 mL at 07/15/17 0855    OBJECTIVE: Vitals:   03/02/18 0843  BP: (!) 146/91  Pulse: 75  Resp: 18  Temp: (!) 97.1 F (36.2 C)     Body mass index is 38.52 kg/m.    ECOG FS:0 - Asymptomatic  General: Well-developed, well-nourished, no acute distress. Eyes: Pink conjunctiva, anicteric sclera. HEENT: Normocephalic, moist mucous membranes. Breast:  Exam deferred today. Lungs: Clear to auscultation bilaterally. Heart: Regular rate and rhythm. No rubs, murmurs, or gallops. Abdomen: Soft, nontender, nondistended. No organomegaly noted, normoactive bowel sounds. Musculoskeletal: No edema, cyanosis, or clubbing. Neuro: Alert, answering all questions appropriately. Cranial nerves grossly intact. Skin: No rashes or petechiae noted. Psych: Normal affect.  LAB RESULTS:  Lab Results  Component Value Date   NA 139 12/30/2017   K 3.7 12/30/2017   CL 106 12/30/2017   CO2 26 12/30/2017   GLUCOSE 106 (H) 12/30/2017   BUN 11 12/30/2017   CREATININE 0.68 12/30/2017   CALCIUM 9.1 12/30/2017   PROT 6.7 12/30/2017   ALBUMIN 3.7 12/30/2017   AST 18 12/30/2017   ALT 13 (L) 12/30/2017   ALKPHOS 106 12/30/2017   BILITOT 0.3 12/30/2017   GFRNONAA >60 12/30/2017   GFRAA >60 12/30/2017    Lab Results  Component Value Date   WBC 6.9 12/30/2017   NEUTROABS 4.6 12/10/2017   HGB 11.8 (L) 12/30/2017   HCT 35.2 12/30/2017   MCV 70.3 (L) 12/30/2017   PLT 235 12/30/2017     STUDIES: Dg Bone Density  Result Date: 02/04/2018 EXAM: DUAL X-RAY ABSORPTIOMETRY (DXA) FOR BONE MINERAL DENSITY IMPRESSION: Dear Dr. Grayland Ormond, Your patient Porfiria Heinrich completed a BMD test on 02/04/2018 using the [place] (analysis version: 14.10) manufactured by [employer]. The following summarizes the results of our evaluation. PATIENT BIOGRAPHICAL: Name: Darius, Fillingim I Patient ID: [id number] Birth Date: [birth year] Height: 64.5 in. Gender: Female Exam Date: 02/04/2018 Weight: 219.0 lbs. Indications: History of Breast Cancer, History of Chemo, History of Fracture (Adult), History of Radiation, Postmenopausal Fractures: fingers Treatments: Letrozole, Multi-Vitamin ASSESSMENT: The BMD measured at Femur Neck Right is 0.851 g/cm2 with a T-score of -1.3. This patient is considered osteopenic according to [facility]) criteria. L-3 was excluded due  to (degenerative changes/compression fracture/surgical hardware, etc) Site Region Measured Measured WHO Young Adult BMD Date       Age      Classification T-score AP Spine L1-L3 02/04/2018 57.7 Normal 0.7 1.263 g/cm2 DualFemur Neck Right 02/04/2018 57.7 Osteopenia -1.3 0.851 g/cm2 World Health Organization [facility]) criteria for post-menopausal, Caucasian Women: Normal:       T-score at or above -1 SD Osteopenia:   T-score between -1 and -2.5 SD Osteoporosis: T-score at or below -2.5 SD RECOMMENDATIONS: 1. All patients should optimize calcium and vitamin D intake. 2. Consider FDA-approved medical therapies in postmenopausal  women and men aged 47 years and older, based on the following: a. A hip or vertebral(clinical or morphometric) fracture b. T-score < -2.5 at the femoral neck or spine after appropriate evaluation to exclude secondary causes c. Low bone mass (T-score between -1.0 and -2.5 at the femoral neck or spine) and a 10-year probability of a hip fracture > 3% or a 10-year probability of a major osteoporosis-related fracture > 20% based on the [place]-adapted WHO algorithm d. Clinician judgment and/or patient preferences may indicate treatment for people with 10-year fracture probabilities above or below these levels FOLLOW-UP: People with diagnosed cases of osteoporosis or at high risk for fracture should have regular bone mineral density tests. For patients eligible for Medicare, routine testing is allowed once every 2 years. The testing frequency can be increased to one year for patients who have rapidly progressing disease, those who are receiving or discontinuing medical therapy to restore bone mass, or have additional risk factors. I have reviewed this report, and agree with the above findings. [facility] Radiology Dear Dr. Grayland Ormond, Your patient HARUKO MERSCH completed a FRAX assessment on 02/04/2018 using the [place] (analysis version: 14.10) manufactured by [employer]. The following  summarizes the results of our evaluation. PATIENT BIOGRAPHICAL: Name: Charlayne, Vultaggio I Patient ID: [id number] Birth Date: 1961/02/19 Height:    64.5 in. Gender:     [age]    [age]       Weight:    219.0 lbs. Ethnicity:  Black                            Exam Date: 02/04/2018 FRAX* RESULTS:  (version: 3.5) 10-year Probability of Fracture1 Major Osteoporotic Fracture2 Hip Fracture 5.1% 0.4% Population: [place] (Black) Risk Factors: History of Fracture (Adult) Based on Femur (Right) Neck BMD 1 -The 10-year probability of fracture may be lower than reported if the patient has received treatment. 2 -Major Osteoporotic Fracture: Clinical Spine, Forearm, Hip or Shoulder *FRAX is a Materials engineer of the [employer] for Metabolic Bone Disease, a [place] (WHO) [facility]. ASSESSMENT: The probability of a major osteoporotic fracture is 5.1% within the next ten years. The probability of a hip fracture is 0.4% within the next ten years. . Electronically Signed   By: Lowella Grip III M.D.   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  PLAN:    1. 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  Patient completed neoadjuvant chemotherapy with Adriamycin, Cytoxan, and Taxol on September 08, 2017.  She then underwent partial mastectomy on October 14, 2017.  Patient noted to have residual disease, therefore proceeded with adjuvant XRT which she completed on January 06, 2018.  Because patient is postmenopausal, she was placed on letrozole which she will continue to take for a total of 5 years completing in June 2024.  Return to clinic in 3 months for further evaluation. 2.  Hot flashes: Likely secondary to  letrozole.  We discussed the possibility of switching to anastrozole, but patient declined at this time. 3.  Osteopenia: Patient had bone mineral density on February 04, 2018 which revealed a T score of -1.3.  I have recommended calcium and vitamin D supplementation.  Repeat in July 2020. 4.  Insomnia/anxiety: Chronic and unchanged.  Continue Xanax as needed. 5.  Neuropathy: Patient does not complain of this today. 6.  Skin damage: Resolved. 7.  History of depression: Patient currently on Wellbutrin.  She was switched to this medication for smoking cessation, but admits she has occasional crying episodes.  She plans to discuss with her primary care physician the possibility of switching back to Paxil. 8.  Clinical trials enrolled:  [id number] - BWEL: Randomized Phase III Trial Evaluating the Role of Weight Loss in Adjuvant Treatment of Overweight and Obese Women with Early Breast Cancer  [id number]: A randomized phase III double blinded placebo-controlled trial of aspirin as adjuvant therapy for HER2 negative breast cancer: the ABC trial    Patient expressed understanding and was in agreement with this plan. She also understands that She can call clinic at any time with any questions, concerns, or complaints.   Cancer Staging Primary cancer of upper outer quadrant of right female breast [facility]) Staging form: Breast, AJCC 8th Edition - Clinical stage from 03/27/2017: Stage IB (cT1c, cN1, cM0, G2, ER: Positive, PR: Positive, HER2: Negative) - Signed by Lloyd Huger, MD on 03/27/2017 - Pathologic stage from 10/22/2017: No Stage Recommended (ypT1c, pN1a, cM0, G2, ER+, PR+, HER2-) - Signed by Lloyd Huger, MD on 10/22/2017   Lloyd Huger, MD   03/05/2018 2:45 PM

## 2018-03-02 ENCOUNTER — Encounter: Payer: Self-pay | Admitting: *Deleted

## 2018-03-02 ENCOUNTER — Inpatient Hospital Stay: Payer: Medicaid Other

## 2018-03-02 ENCOUNTER — Encounter: Payer: Self-pay | Admitting: Oncology

## 2018-03-02 ENCOUNTER — Other Ambulatory Visit: Payer: Self-pay

## 2018-03-02 ENCOUNTER — Inpatient Hospital Stay (HOSPITAL_BASED_OUTPATIENT_CLINIC_OR_DEPARTMENT_OTHER): Payer: Medicaid Other | Admitting: Oncology

## 2018-03-02 VITALS — BP 146/91 | HR 75 | Temp 97.1°F | Resp 18 | Ht 63.5 in | Wt 220.9 lb

## 2018-03-02 DIAGNOSIS — Z79811 Long term (current) use of aromatase inhibitors: Secondary | ICD-10-CM

## 2018-03-02 DIAGNOSIS — G629 Polyneuropathy, unspecified: Secondary | ICD-10-CM

## 2018-03-02 DIAGNOSIS — Z006 Encounter for examination for normal comparison and control in clinical research program: Secondary | ICD-10-CM

## 2018-03-02 DIAGNOSIS — Z170 Estrogen receptor positive status [ER+]: Secondary | ICD-10-CM

## 2018-03-02 DIAGNOSIS — Z923 Personal history of irradiation: Secondary | ICD-10-CM

## 2018-03-02 DIAGNOSIS — F419 Anxiety disorder, unspecified: Secondary | ICD-10-CM

## 2018-03-02 DIAGNOSIS — G4700 Insomnia, unspecified: Secondary | ICD-10-CM

## 2018-03-02 DIAGNOSIS — F329 Major depressive disorder, single episode, unspecified: Secondary | ICD-10-CM

## 2018-03-02 DIAGNOSIS — C50411 Malignant neoplasm of upper-outer quadrant of right female breast: Secondary | ICD-10-CM | POA: Diagnosis not present

## 2018-03-02 DIAGNOSIS — N951 Menopausal and female climacteric states: Secondary | ICD-10-CM | POA: Diagnosis not present

## 2018-03-02 DIAGNOSIS — Z9221 Personal history of antineoplastic chemotherapy: Secondary | ICD-10-CM

## 2018-03-02 DIAGNOSIS — Z79899 Other long term (current) drug therapy: Secondary | ICD-10-CM

## 2018-03-02 DIAGNOSIS — M8580 Other specified disorders of bone density and structure, unspecified site: Secondary | ICD-10-CM

## 2018-03-02 DIAGNOSIS — C801 Malignant (primary) neoplasm, unspecified: Secondary | ICD-10-CM | POA: Insufficient documentation

## 2018-03-02 LAB — GLUCOSE, FASTING: Glucose, Fasting: 111 mg/dL — ABNORMAL HIGH (ref 70–99)

## 2018-03-02 NOTE — Progress Notes (Signed)
Patient here for follow up. Pt states hot flashes are getting worse.

## 2018-03-05 ENCOUNTER — Other Ambulatory Visit: Payer: Self-pay | Admitting: *Deleted

## 2018-03-08 ENCOUNTER — Encounter: Payer: Self-pay | Admitting: Oncology

## 2018-03-08 ENCOUNTER — Encounter: Payer: Self-pay | Admitting: *Deleted

## 2018-03-08 DIAGNOSIS — C50411 Malignant neoplasm of upper-outer quadrant of right female breast: Secondary | ICD-10-CM

## 2018-03-08 NOTE — Progress Notes (Signed)
Destiny Rogers presented to clinic this morning as instructed to pick up her study drug and get started on treatment. Patient states she is doing well. Reviewed baseline solicited AEs as noted below in order to document no bleeding events prior to initiating aspirin therapy on-study. Study drug dispensed by pharmacy after validating patient specific IP: aspirin/placebo was labeled with correct patient study ID number. Patient was inadvertently dispensed only 90 tablets this visit; however she has a return appointment for port-a-cath flush on 04/01/18 and the remainder of the study drug will be given to her at that time to last the first 6 months. Patient was given study-specific medication calendars and instructed to document the time and date that she takes her study drug each day. She was also instructed to take one caplet at approx. the same time each day with food and not to crush the pill. Patient able to repeat back instructions correctly.   Adverse Event Log  Study/Protocol: [id number] - ABC  Cycle: Baseline  Event Grade Onset Date Resolved Date Drug Name Aspirin/placebo Attribution Treatment Comments  Gastrointestinal Bleeding 0        Intracranial Hemorrhage 0        Epistaxis 0        Hematuria 0        Dyspepsia 1 04/21/2017     unrelated  none Pt declined tx  Gastritis 0        Bruising [address], BSN, [place], OCN 03/08/2018  10:07 AM

## 2018-04-01 ENCOUNTER — Inpatient Hospital Stay: Payer: Medicaid Other

## 2018-04-14 ENCOUNTER — Inpatient Hospital Stay (HOSPITAL_BASED_OUTPATIENT_CLINIC_OR_DEPARTMENT_OTHER): Payer: Medicaid Other | Admitting: Oncology

## 2018-04-14 ENCOUNTER — Ambulatory Visit: Payer: Medicaid Other | Admitting: Oncology

## 2018-04-14 ENCOUNTER — Inpatient Hospital Stay: Payer: Medicaid Other

## 2018-04-14 ENCOUNTER — Inpatient Hospital Stay: Payer: Medicaid Other | Attending: Oncology

## 2018-04-14 VITALS — BP 147/84 | HR 55 | Temp 97.6°F | Wt 231.2 lb

## 2018-04-14 DIAGNOSIS — T451X5A Adverse effect of antineoplastic and immunosuppressive drugs, initial encounter: Secondary | ICD-10-CM

## 2018-04-14 DIAGNOSIS — Z170 Estrogen receptor positive status [ER+]: Secondary | ICD-10-CM | POA: Insufficient documentation

## 2018-04-14 DIAGNOSIS — Z79811 Long term (current) use of aromatase inhibitors: Secondary | ICD-10-CM | POA: Insufficient documentation

## 2018-04-14 DIAGNOSIS — C50411 Malignant neoplasm of upper-outer quadrant of right female breast: Secondary | ICD-10-CM | POA: Diagnosis present

## 2018-04-14 DIAGNOSIS — B372 Candidiasis of skin and nail: Secondary | ICD-10-CM

## 2018-04-14 DIAGNOSIS — Z95828 Presence of other vascular implants and grafts: Secondary | ICD-10-CM

## 2018-04-14 DIAGNOSIS — Z452 Encounter for adjustment and management of vascular access device: Secondary | ICD-10-CM | POA: Diagnosis not present

## 2018-04-14 DIAGNOSIS — R232 Flushing: Secondary | ICD-10-CM

## 2018-04-14 DIAGNOSIS — S21009A Unspecified open wound of unspecified breast, initial encounter: Secondary | ICD-10-CM

## 2018-04-14 MED ORDER — NYSTATIN 100000 UNIT/GM EX POWD: Freq: Three times a day (TID) | CUTANEOUS | 0 refills | Status: DC

## 2018-04-14 MED ORDER — SODIUM CHLORIDE 0.9% FLUSH
10.0000 mL | Freq: Once | INTRAVENOUS | Status: AC
  Administered 2018-04-14: 10 mL via INTRAVENOUS
  Filled 2018-04-14: qty 10

## 2018-04-14 MED ORDER — TRIAMCINOLONE ACETONIDE 0.5 % EX OINT: 1.0000 "application " | TOPICAL_OINTMENT | Freq: Two times a day (BID) | CUTANEOUS | 0 refills | Status: DC

## 2018-04-14 MED ORDER — HEPARIN SOD (PORK) LOCK FLUSH 100 UNIT/ML IV SOLN
500.0000 [IU] | Freq: Once | INTRAVENOUS | Status: AC
  Administered 2018-04-14: 500 [IU] via INTRAVENOUS

## 2018-04-14 NOTE — Progress Notes (Signed)
CLINIC:  Survivorship   REASON FOR VISIT:  Routine follow-up for history of breast cancer.   BRIEF ONCOLOGIC HISTORY:     Primary cancer of upper outer quadrant of right female breast ([place])   03/27/2017 Initial Diagnosis    Primary cancer of upper outer quadrant of right female breast ([place])    10/22/2017 Cancer Staging    Staging form: Breast, AJCC 8th Edition - Pathologic stage from 10/22/2017: No Stage Recommended (ypT1c, pN1a, cM0, G2, ER+, PR+, HER2-) - Signed by Lloyd Huger, MD on 10/22/2017     INTERVAL HISTORY:  Destiny Rogers presents to the [facility] today for routine follow-up for her history of breast cancer.  Overall, she reports feeling quite well.  Tolerating letrozole well with mild hot flashes.  States they do not interfere with daily activities or quality of life.  Admits to a "rash" under both breasts that is becoming increasingly more painful.  Admits to using creams and lotions without relief.  She was recently treated for a sinus infection with doxycycline.  Complains of right breast pain started about a week ago.   REVIEW OF SYSTEMS:  Review of Systems  Constitutional: Negative for appetite change, fatigue, fever and unexpected weight change.  HENT:   Negative for nosebleeds, sore throat and trouble swallowing.   Eyes: Negative.   Respiratory: Negative.  Negative for cough, shortness of breath and wheezing.   Cardiovascular: Negative.  Negative for chest pain and leg swelling.  Gastrointestinal: Negative for abdominal pain, blood in stool, constipation, diarrhea, nausea and vomiting.  Endocrine: Positive for hot flashes (tolerable).  Genitourinary: Negative.  Negative for bladder incontinence, hematuria and nocturia.   Musculoskeletal: Negative.  Negative for back pain and flank pain.  Skin: Positive for rash and wound.  Neurological: Negative.  Negative for dizziness, headaches, light-headedness and numbness.  Hematological: Negative.     Psychiatric/Behavioral: Negative.  Negative for confusion. The patient is not nervous/anxious.    PHYSICAL EXAMINATION:  Today's Vitals   04/14/18 1125  BP: (!) 147/84  Pulse: (!) 55  Temp: 97.6 F (36.4 C)  TempSrc: Tympanic  Weight: 231 lb 3.2 oz (104.9 kg)  PainSc: 0-No pain   Body mass index is 40.31 kg/m. Physical Exam  Constitutional: She is oriented to person, place, and time. Vital signs are normal. She appears well-developed and well-nourished.  HENT:  Head: Normocephalic and atraumatic.  Eyes: Pupils are equal, round, and reactive to light.  Neck: Normal range of motion.  Cardiovascular: Normal rate, regular rhythm and normal heart sounds.  No murmur heard. Pulmonary/Chest: Effort normal and breath sounds normal. She has no wheezes. Right breast exhibits skin change (warm to touch; not fluctuant). There is breast tenderness.  Abdominal: Soft. Normal appearance and bowel sounds are normal. She exhibits no distension. There is no tenderness.  Musculoskeletal: Normal range of motion. She exhibits no edema.  Neurological: She is alert and oriented to person, place, and time.  Skin: Skin is warm and dry. Rash (Yeast under breast) noted. There is erythema.  Right breast- lateral breast wound at lumpectomy site  Psychiatric: Judgment normal.      PAST MEDICAL/SURGICAL HISTORY:  Past Medical History:  Diagnosis Date  . Anemia   . Arthritis   . Cancer ([place])   . Dyspnea    SINCE STARTING CHEMO WITH EXERTION  . GERD (gastroesophageal reflux disease)    NO MEDS  . Heart murmur    DX IN TEENS-ASYMPTOMATIC  . Hypertension   .  Migraines    H/O MIGRAINES  . Personal history of chemotherapy 2018   finished 09/08/17  . Thumb fracture    LEFT-WEARING BRACE   Past Surgical History:  Procedure Laterality Date  . BREAST BIOPSY Right 03/19/2017   US guided biopsy of right breast and right lymph node/ positive  . BREAST LUMPECTOMY Right 10/14/2017  . CESAREAN SECTION     . FRACTURE SURGERY     left hand  . PARTIAL MASTECTOMY WITH NEEDLE LOCALIZATION Right 10/14/2017   Procedure: PARTIAL MASTECTOMY WITH NEEDLE LOCALIZATION;  Surgeon: Herbert Pun, MD;  Location: [facility] ORS;  Service: General;  Laterality: Right;  . PILONIDAL CYST EXCISION  1980'S  . PORT-A-CATH REMOVAL N/A 04/27/2017   Procedure: REMOVAL OF PORT A CATH & insertion PORT A CATH LEFT INTERNAL JUGULAR;  Surgeon: Herbert Pun, MD;  Location: [facility] ORS;  Service: General;  Laterality: N/A;  . PORTACATH PLACEMENT N/A 04/15/2017   Procedure: INSERTION PORT-A-CATH;  Surgeon: Herbert Pun, MD;  Location: [facility] ORS;  Service: General;  Laterality: N/A;  . SENTINEL NODE BIOPSY Right 10/14/2017   Procedure: SENTINEL NODE BIOPSY;  Surgeon: Herbert Pun, MD;  Location: [facility] ORS;  Service: General;  Laterality: Right;     ALLERGIES:  Allergies  Allergen Reactions  . Hydrocodone-Acetaminophen Itching and Rash  . Penicillins Swelling, Other (See Comments) and Rash    Throat swelling Has patient had a PCN reaction causing immediate rash, facial/tongue/throat swelling, SOB or lightheadedness with hypotension: Yes Has patient had a PCN reaction causing severe rash involving mucus membranes or skin necrosis: No Has patient had a PCN reaction that required hospitalization: No Has patient had a PCN reaction occurring within the last 10 years: No If all of the above answers are "NO", then may proceed with Cephalosporin use.      CURRENT MEDICATIONS:  Outpatient Encounter Medications as of 04/14/2018  Medication Sig Note  . acetaminophen (TYLENOL) 500 MG tablet Take 1,000 mg by mouth every 8 (eight) hours as needed for mild pain.   Marland Kitchen ALPRAZolam (XANAX) 0.25 MG tablet Take 1 tablet (0.25 mg total) by mouth at bedtime as needed for anxiety.   . bisoprolol-hydrochlorothiazide (ZIAC) 5-6.25 MG tablet Take 1 tablet by mouth daily.    . Calcium-Magnesium-Zinc (CAL-MAG-ZINC PO) Take 1  tablet by mouth daily.   . diclofenac sodium (VOLTAREN) 1 % GEL Apply 2 g topically 4 (four) times daily as needed (for pain in right thumb).   Marland Kitchen letrozole (FEMARA) 2.5 MG tablet Take 1 tablet (2.5 mg total) by mouth daily.   Marland Kitchen lidocaine-prilocaine (EMLA) cream Apply to affected area once (Patient taking differently: Apply 1 application topically daily as needed (prior to port being accessed.). Apply to affected area once)   . Multiple Vitamins-Minerals (ADULT GUMMY PO) Take 2 tablets by mouth daily. One-A-Day Women's Multivitamin Gummies   . traMADol (ULTRAM) 50 MG tablet Take 1 tablet (50 mg total) by mouth every 6 (six) hours as needed (for pain.). 03/02/2018: Pt takes 1/2 tab  . vitamin C (ASCORBIC ACID) 500 MG tablet Take 500 mg by mouth daily.   Marland Kitchen buPROPion (WELLBUTRIN SR) 200 MG 12 hr tablet Take 200 mg by mouth 2 (two) times daily as needed.    . ferrous sulfate 325 (65 FE) MG tablet Take 325 mg by mouth daily with breakfast.   . gabapentin (NEURONTIN) 300 MG capsule TAKE 1 CAPSULE BY MOUTH THREE TIMES A DAY (Patient not taking: Reported on 04/14/2018)   . ibuprofen (ADVIL,MOTRIN) 600  MG tablet TAKE 1 TABLET BY MOUTH EVERY 6HRS AS NEEDED FOR PAIN DISCONTINUE ORDER WHEN COMPLETED   . loratadine (CLARITIN) 10 MG tablet Take 10 mg by mouth daily as needed for allergies.   Marland Kitchen ondansetron (ZOFRAN) 8 MG tablet Take 1 tablet (8 mg total) by mouth 2 (two) times daily as needed. (Patient not taking: Reported on 03/02/2018)   . prochlorperazine (COMPAZINE) 10 MG tablet Take 1 tablet (10 mg total) by mouth every 6 (six) hours as needed (Nausea or vomiting). (Patient not taking: Reported on 03/02/2018)   . promethazine (PHENERGAN) 25 MG suppository Place 1 suppository (25 mg total) rectally every 6 (six) hours as needed for nausea or vomiting. (Patient not taking: Reported on 03/02/2018)   . silver sulfADIAZINE (SILVADENE) 1 % cream Apply 1 application topically 2 (two) times daily. (Patient not taking:  Reported on 03/02/2018)    Facility-Administered Encounter Medications as of 04/14/2018  Medication  . [COMPLETED] heparin lock flush 100 unit/mL  . sodium chloride flush (NS) 0.9 % injection 10 mL  . [COMPLETED] sodium chloride flush (NS) 0.9 % injection 10 mL     ONCOLOGIC FAMILY HISTORY:  Family History  Problem Relation Age of Onset  . Breast cancer Maternal Grandmother   . Hypertension Maternal Grandmother   . Angina Mother   . Lung cancer Father   . Diabetes Father   . Prostate cancer Maternal Uncle   . Dementia Maternal Grandfather   . Congestive Heart Failure Maternal Grandfather   . Heart disease Paternal Grandmother     GENETIC COUNSELING/TESTING: None   SOCIAL HISTORY:  Destiny Rogers is single and lives alone in [place], [place].  She denies any current or history of tobacco, alcohol, or illicit drug use.    LABORATORY DATA:  None for this visit.  CBC Latest Ref Rng & Units 12/30/2017 12/16/2017 12/10/2017  WBC 3.6 - 11.0 K/uL 6.9 6.8 6.9  Hemoglobin 12.0 - 16.0 g/dL 11.8(L) 12.2 11.6(L)  Hematocrit 35.0 - 47.0 % 35.2 37.2 35.6  Platelets 150 - 440 K/uL 235 197 217   DIAGNOSTIC IMAGING:  Most recent mammogram: 10/05/17- Revealed now known right breast and axillary mass; biopsy was recommended.  Biopsy revealed invasive mammary carcinoma and ductal carcinoma in situ.  Margins were negative. Needs mammogram   ASSESSMENT AND PLAN:  Ms.. Delmont is a pleasant 57 y.o. 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  Lumpectomy, adjuvant radiation therapy, and anti-estrogen therapy with Letrozole beginning in (June 2019).  She presents to the [facility]orship [facility] for surveillance and routine follow-up.   1. History of breast cancer:  Ms. Cadenhead is currently clinically and radiographically without evidence of disease or  recurrence of breast cancer. She will be due for mammogram in March 2020; orders placed today.  She will continue her anti-estrogen therapy with Letrozole, with plans to continue for 5 years.  She will return to the cancer center to see her medical oncologist, Dr. Grayland Ormond, in November/2019.  I encouraged her to call me with any questions or concerns before her next visit at the cancer center, and I would be happy to see her sooner, if needed.    #. Problem(s) at Visit:  Yeast under bilateral breasts: RX nystatin powder.  Provided interdry.   Lumpectomy site tenderness: Hard area located site.  Tender and warm to touch.  Recently started on doxycycline for sinus infection.  Patient did not improve in the next couple days, she would need to see her surgeon for further follow-up.   Hot flashes: Patient states they are tolerable.  If they get worse, she is instructed to call clinic.  #. Bone health:  Given Ms. Telleria's age, history of breast cancer, and her current anti-estrogen therapy with Letrozole, she is at risk for bone demineralization. Her last DEXA scan was on 02/04/2018.  In the meantime, she was encouraged to increase her consumption of foods rich in calcium, as well as increase her weight-bearing activities.  She was given education on specific food and activities to promote bone health.  #. Cancer screening:  Due to Ms. Doetsch's history and her age, she should receive screening for skin cancers, colon cancer, and gynecologic cancers. She was encouraged to follow-up with her PCP for appropriate cancer screenings.   #. Health maintenance and wellness promotion: Ms. Widener was encouraged to consume 5-7 servings of fruits and vegetables per day. She was also encouraged to engage in moderate to vigorous exercise for 30 minutes per day most days of the week. She was instructed to limit her alcohol consumption and continue to abstain from tobacco use.     Dispo:  -Return to cancer center as  scheduled for assessment with Dr. Grayland Ormond (05/31/18).  A total of (30) minutes of face-to-face time was spent with this patient with greater than 50% of that time in counseling and care-coordination.  Faythe Casa, [place] [phone]  Note: PRIMARY CARE PROVIDER [place], [place] [phone]

## 2018-04-14 NOTE — Progress Notes (Signed)
Survivorship Care Plan visit completed.  Treatment summary reviewed and given to patient.  ASCO answers booklet reviewed and given to patient.  CARE program and Cancer Transitions discussed with patient along with other resources cancer center offers to patients and caregivers.  Patient verbalized understanding.    

## 2018-04-22 ENCOUNTER — Telehealth: Payer: Self-pay | Admitting: *Deleted

## 2018-04-22 NOTE — Telephone Encounter (Signed)
Patient called to that her incision is leaking and states she was recently seen by Lorretta Harp, NP (Survivorship Visit). Should she call surgeon or should she come see [facility]?

## 2018-04-22 NOTE — Telephone Encounter (Signed)
Per VO Dr Grayland Ormond, patient to come in and see Lorretta Harp, NP tomorrow.

## 2018-04-22 NOTE — Telephone Encounter (Signed)
See the surgeon first.

## 2018-04-22 NOTE — Telephone Encounter (Signed)
I discussed with patient who reports that she has 2 quarter sized opened areas on her scar site which Dr Windell Moment did surgery in March 2019. She is going to call his office sand if she cannot see him this week, she will call me back to see NP here

## 2018-04-22 NOTE — Telephone Encounter (Signed)
Patient is currently at [facility] Surgery Department to see Dr Windell Moment

## 2018-04-26 ENCOUNTER — Other Ambulatory Visit: Payer: Self-pay

## 2018-04-26 ENCOUNTER — Encounter: Payer: Self-pay | Admitting: *Deleted

## 2018-04-26 ENCOUNTER — Ambulatory Visit: Payer: Self-pay | Admitting: General Surgery

## 2018-04-26 ENCOUNTER — Ambulatory Visit: Payer: Medicaid Other | Admitting: Certified Registered Nurse Anesthetist

## 2018-04-26 ENCOUNTER — Ambulatory Visit
Admission: RE | Admit: 2018-04-26 | Discharge: 2018-04-26 | Disposition: A | Discharge status: Home / Self Care | Payer: Medicaid Other | Source: Ambulatory Visit | Attending: General Surgery | Admitting: General Surgery

## 2018-04-26 ENCOUNTER — Encounter
Admission: RE | Disposition: A | Discharge status: Home / Self Care | Payer: Self-pay | Source: Ambulatory Visit | Attending: General Surgery

## 2018-04-26 DIAGNOSIS — Z87891 Personal history of nicotine dependence: Secondary | ICD-10-CM | POA: Insufficient documentation

## 2018-04-26 DIAGNOSIS — N611 Abscess of the breast and nipple: Secondary | ICD-10-CM | POA: Diagnosis not present

## 2018-04-26 DIAGNOSIS — I10 Essential (primary) hypertension: Secondary | ICD-10-CM | POA: Diagnosis not present

## 2018-04-26 DIAGNOSIS — Z880 Allergy status to penicillin: Secondary | ICD-10-CM | POA: Insufficient documentation

## 2018-04-26 DIAGNOSIS — E785 Hyperlipidemia, unspecified: Secondary | ICD-10-CM | POA: Insufficient documentation

## 2018-04-26 DIAGNOSIS — Z79899 Other long term (current) drug therapy: Secondary | ICD-10-CM | POA: Diagnosis not present

## 2018-04-26 DIAGNOSIS — F329 Major depressive disorder, single episode, unspecified: Secondary | ICD-10-CM | POA: Diagnosis not present

## 2018-04-26 DIAGNOSIS — Z885 Allergy status to narcotic agent status: Secondary | ICD-10-CM | POA: Insufficient documentation

## 2018-04-26 DIAGNOSIS — N6489 Other specified disorders of breast: Secondary | ICD-10-CM | POA: Insufficient documentation

## 2018-04-26 HISTORY — PX: INCISION AND DRAINAGE ABSCESS: SHX5864

## 2018-04-26 SURGERY — INCISION AND DRAINAGE, ABSCESS
Anesthesia: General | Site: Breast | Laterality: Right

## 2018-04-26 MED ORDER — MIDAZOLAM HCL 2 MG/2ML IJ SOLN
INTRAMUSCULAR | Status: AC
  Filled 2018-04-26: qty 2

## 2018-04-26 MED ORDER — FENTANYL CITRATE (PF) 100 MCG/2ML IJ SOLN
INTRAMUSCULAR | Status: AC
  Administered 2018-04-26: 25 ug via INTRAVENOUS
  Filled 2018-04-26: qty 2

## 2018-04-26 MED ORDER — PROPOFOL 10 MG/ML IV BOLUS
INTRAVENOUS | Status: AC
  Filled 2018-04-26: qty 20

## 2018-04-26 MED ORDER — FENTANYL CITRATE (PF) 100 MCG/2ML IJ SOLN
INTRAMUSCULAR | Status: DC | PRN
  Administered 2018-04-26 (×2): 50 ug via INTRAVENOUS

## 2018-04-26 MED ORDER — ONDANSETRON HCL 4 MG/2ML IJ SOLN: 4.0000 mg | Freq: Once | INTRAMUSCULAR | Status: DC | PRN

## 2018-04-26 MED ORDER — MIDAZOLAM HCL 2 MG/2ML IJ SOLN
INTRAMUSCULAR | Status: DC | PRN
  Administered 2018-04-26: 2 mg via INTRAVENOUS

## 2018-04-26 MED ORDER — DEXAMETHASONE SODIUM PHOSPHATE 10 MG/ML IJ SOLN
INTRAMUSCULAR | Status: DC | PRN
  Administered 2018-04-26: 10 mg via INTRAVENOUS

## 2018-04-26 MED ORDER — BISOPROLOL FUMARATE 5 MG PO TABS
5.0000 mg | ORAL_TABLET | Freq: Once | ORAL | Status: AC
  Administered 2018-04-26: 5 mg via ORAL
  Filled 2018-04-26: qty 1

## 2018-04-26 MED ORDER — LACTATED RINGERS IV SOLN
INTRAVENOUS | Status: DC
  Administered 2018-04-26: 11:00:00 via INTRAVENOUS

## 2018-04-26 MED ORDER — PROPOFOL 10 MG/ML IV BOLUS
INTRAVENOUS | Status: DC | PRN
  Administered 2018-04-26: 200 mg via INTRAVENOUS

## 2018-04-26 MED ORDER — PHENYLEPHRINE HCL 10 MG/ML IJ SOLN
INTRAMUSCULAR | Status: DC | PRN
  Administered 2018-04-26: 100 ug via INTRAVENOUS

## 2018-04-26 MED ORDER — FENTANYL CITRATE (PF) 100 MCG/2ML IJ SOLN
INTRAMUSCULAR | Status: AC
  Filled 2018-04-26: qty 2

## 2018-04-26 MED ORDER — ACETAMINOPHEN 10 MG/ML IV SOLN
INTRAVENOUS | Status: DC | PRN
  Administered 2018-04-26: 1000 mg via INTRAVENOUS

## 2018-04-26 MED ORDER — VANCOMYCIN HCL IN DEXTROSE 1-5 GM/200ML-% IV SOLN
INTRAVENOUS | Status: AC
  Administered 2018-04-26: 1000 mg via INTRAVENOUS
  Filled 2018-04-26: qty 200

## 2018-04-26 MED ORDER — TRAMADOL HCL 50 MG PO TABS: 50.0000 mg | ORAL_TABLET | Freq: Four times a day (QID) | ORAL | Status: DC

## 2018-04-26 MED ORDER — ONDANSETRON HCL 4 MG/2ML IJ SOLN
INTRAMUSCULAR | Status: DC | PRN
  Administered 2018-04-26: 4 mg via INTRAVENOUS

## 2018-04-26 MED ORDER — TRAMADOL HCL 50 MG PO TABS
ORAL_TABLET | ORAL | Status: AC
  Administered 2018-04-26: 50 mg via ORAL
  Filled 2018-04-26: qty 1

## 2018-04-26 MED ORDER — ONDANSETRON HCL 4 MG/2ML IJ SOLN
INTRAMUSCULAR | Status: AC
  Filled 2018-04-26: qty 2

## 2018-04-26 MED ORDER — FAMOTIDINE 20 MG PO TABS: 20.0000 mg | ORAL_TABLET | Freq: Once | ORAL | Status: DC

## 2018-04-26 MED ORDER — LIDOCAINE HCL (PF) 2 % IJ SOLN
INTRAMUSCULAR | Status: AC
  Filled 2018-04-26: qty 10

## 2018-04-26 MED ORDER — FENTANYL CITRATE (PF) 100 MCG/2ML IJ SOLN
25.0000 ug | INTRAMUSCULAR | Status: DC | PRN
  Administered 2018-04-26 (×5): 25 ug via INTRAVENOUS

## 2018-04-26 MED ORDER — DEXAMETHASONE SODIUM PHOSPHATE 10 MG/ML IJ SOLN
INTRAMUSCULAR | Status: AC
  Filled 2018-04-26: qty 1

## 2018-04-26 MED ORDER — VANCOMYCIN HCL IN DEXTROSE 1-5 GM/200ML-% IV SOLN
1000.0000 mg | INTRAVENOUS | Status: AC
  Administered 2018-04-26: 1000 mg via INTRAVENOUS

## 2018-04-26 MED ORDER — FAMOTIDINE 20 MG PO TABS
ORAL_TABLET | ORAL | Status: AC
  Filled 2018-04-26: qty 1

## 2018-04-26 MED ORDER — TRAMADOL HCL 50 MG PO TABS: 50.0000 mg | ORAL_TABLET | Freq: Four times a day (QID) | ORAL | 0 refills | Status: AC | PRN

## 2018-04-26 MED ORDER — EPHEDRINE SULFATE 50 MG/ML IJ SOLN
INTRAMUSCULAR | Status: AC
  Filled 2018-04-26: qty 1

## 2018-04-26 MED ORDER — ACETAMINOPHEN 10 MG/ML IV SOLN
INTRAVENOUS | Status: AC
  Filled 2018-04-26: qty 100

## 2018-04-26 MED ORDER — LIDOCAINE HCL (CARDIAC) PF 100 MG/5ML IV SOSY
PREFILLED_SYRINGE | INTRAVENOUS | Status: DC | PRN
  Administered 2018-04-26: 100 mg via INTRAVENOUS

## 2018-04-26 SURGICAL SUPPLY — 35 items
BASIN GRAD PLASTIC 32OZ STRL (MISCELLANEOUS) ×2 IMPLANT
BLADE CLIPPER SURG (BLADE) ×3 IMPLANT
BLADE SURG 15 STRL LF DISP TIS (BLADE) ×1 IMPLANT
BLADE SURG 15 STRL SS (BLADE) ×2
BRUSH SCRUB EZ  4% CHG (MISCELLANEOUS)
BRUSH SCRUB EZ 4% CHG (MISCELLANEOUS) ×1 IMPLANT
CANISTER SUCT 3000ML PPV (MISCELLANEOUS) ×3 IMPLANT
CHLORAPREP W/TINT 26ML (MISCELLANEOUS) ×2 IMPLANT
DRAIN PENROSE 1/4X12 LTX (DRAIN) ×2 IMPLANT
DRAPE CHEST BREAST 77X106 FENE (MISCELLANEOUS) IMPLANT
DRAPE LAPAROTOMY 77X122 PED (DRAPES) ×3 IMPLANT
ELECT REM PT RETURN 9FT ADLT (ELECTROSURGICAL) ×3
ELECTRODE REM PT RTRN 9FT ADLT (ELECTROSURGICAL) ×1 IMPLANT
GLOVE BIO SURGEON STRL SZ 6.5 (GLOVE) ×5 IMPLANT
GLOVE BIO SURGEONS STRL SZ 6.5 (GLOVE) ×4
GLOVE BIOGEL PI IND STRL 6.5 (GLOVE) ×1 IMPLANT
GLOVE BIOGEL PI IND STRL 7.5 (GLOVE) IMPLANT
GLOVE BIOGEL PI INDICATOR 6.5 (GLOVE) ×2
GLOVE BIOGEL PI INDICATOR 7.5 (GLOVE) ×8
GOWN STRL REUS W/ TWL LRG LVL3 (GOWN DISPOSABLE) ×2 IMPLANT
GOWN STRL REUS W/TWL LRG LVL3 (GOWN DISPOSABLE) ×6
NEEDLE HYPO 22GX1.5 SAFETY (NEEDLE) ×3 IMPLANT
NS IRRIG 1000ML POUR BTL (IV SOLUTION) ×3 IMPLANT
PACK BASIN MINOR ARMC (MISCELLANEOUS) ×3 IMPLANT
SCRUB POVIDONE IODINE 4 OZ (MISCELLANEOUS) ×1 IMPLANT
SOL PREP PVP 2OZ (MISCELLANEOUS) ×3
SOLUTION PREP PVP 2OZ (MISCELLANEOUS) ×1 IMPLANT
SPONGE LAP 18X18 RF (DISPOSABLE) ×3 IMPLANT
SUT ETHILON 3-0 FS-10 30 BLK (SUTURE) ×3
SUT VIC AB 3-0 SH 27 (SUTURE) ×2
SUT VIC AB 3-0 SH 27X BRD (SUTURE) IMPLANT
SUTURE EHLN 3-0 FS-10 30 BLK (SUTURE) IMPLANT
SWAB CULTURE AMIES ANAERIB BLU (MISCELLANEOUS) ×2 IMPLANT
SYR BULB 3OZ (MISCELLANEOUS) ×2 IMPLANT
WATER STERILE IRR 1000ML POUR (IV SOLUTION) ×2 IMPLANT

## 2018-04-26 NOTE — Anesthesia Post-op Follow-up Note (Signed)
Anesthesia QCDR form completed.        

## 2018-04-26 NOTE — Anesthesia Procedure Notes (Addendum)
Procedure Name: LMA Insertion Date/Time: 04/26/2018 1:34 PM Performed by: Johnna Acosta, CRNA Pre-anesthesia Checklist: Patient identified, Emergency Drugs available, Suction available, Patient being monitored and Timeout performed Patient Re-evaluated:Patient Re-evaluated prior to induction Oxygen Delivery Method: Circle system utilized Preoxygenation: Pre-oxygenation with 100% oxygen Induction Type: IV induction LMA: LMA inserted LMA Size: 5.0 Tube type: Oral Number of attempts: 1 Placement Confirmation: positive ETCO2 and breath sounds checked- equal and bilateral Tube secured with: Tape Dental Injury: Teeth and Oropharynx as per pre-operative assessment

## 2018-04-26 NOTE — Progress Notes (Signed)
ICE PACK TO INCISIONAL AREA

## 2018-04-26 NOTE — Interval H&P Note (Deleted)
History and Physical Interval Note:  04/26/2018 12:30 PM  Destiny Rogers  has presented today for surgery, with the diagnosis of [place]  The various methods of treatment have been discussed with the patient and family. After consideration of risks, benefits and other options for treatment, the patient has consented to  Procedure(s): INCISION AND DRAINAGE ABSCESS (Right) as a surgical intervention .  The patient's history has been reviewed, patient examined, no change in status, stable for surgery.  I have reviewed the patient's chart and labs.  Right breast marked in the pre procedure room. Questions were answered to the patient's satisfaction.     Herbert Pun

## 2018-04-26 NOTE — H&P (Signed)
HISTORY OF PRESENT ILLNESS:    Destiny Rogers is a 57 y.o.female patient who comes for evaluation of right breast wound pain.  Patient refers is having right breast wound pain and drainage since two week ago. The patient refers that is getting more and more painful. Refers drainage is mostly clear. Denies fever or chills. Denies trauma to the breast. Patient had a recent cold and flu and is taking doxycycline.   Patient came to my office last week and incision and drainage vs aspiration was recommended. Patient did not consent and preferred to try antibiotic therapy. Today patient comes with significant pain on area and still without drainage. Today patient consent for incision and drainage.       PAST MEDICAL HISTORY:      Past Medical History:  Diagnosis Date  . Depression   . Hyperlipidemia   . Hypertension   . Migraine         PAST SURGICAL HISTORY:        Past Surgical History:  Procedure Laterality Date  . CESAREAN SECTION    . DILATION AND CURETTAGE, DIAGNOSTIC / THERAPEUTIC  2008   and uterine ablation - Dr. Laurey Morale  . MASTECTOMY, PARTIAL Right 10/14/2017   Dr Lesli Albee -- w/ sentinel node  . TUBAL LIGATION           MEDICATIONS:  EncounterMedications        Outpatient Encounter Medications as of 04/22/2018  Medication Sig Dispense Refill  . acetaminophen (TYLENOL) 500 MG tablet Take 1,000 mg by mouth every 8 (eight) hours as needed    . ALPRAZolam (XANAX) 0.25 MG tablet Take 0.25 mg by mouth nightly as needed  1  . bisoprolol-hydrochlorothiazide (ZIAC) 5-6.25 mg tablet TAKE 1 TABLET BY MOUTH ONCE DAILY. 30 tablet 11  . diclofenac (VOLTAREN) 1 % topical gel APPLY 2 GRAMS TO AFFECTED AREA UP TO 4 TIMES A DAY AS NEEDED    . doxycycline (VIBRA-TABS) 100 MG tablet Take 1 tablet (100 mg total) by mouth 2 (two) times daily 20 tablet 0  . fluticasone propionate (FLONASE) 50 mcg/actuation nasal spray Place 2 sprays into both nostrils once daily 16 g 2  .  letrozole (FEMARA) 2.5 mg tablet Take 2.5 mg by mouth once daily    . lidocaine-prilocaine (EMLA) cream APPLY TO AFFECTED AREA ONCE  3  . traMADol (ULTRAM) 50 mg tablet PLEASE SEE ATTACHED FOR DETAILED DIRECTIONS 10 tablet 0  . sulfamethoxazole-trimethoprim (BACTRIM DS) 800-160 mg tablet Take 1 tablet (160 mg of trimethoprim total) by mouth every 12 (twelve) hours 14 tablet 0  . traMADol (ULTRAM) 50 mg tablet Take 1 tablet (50 mg total) by mouth every 6 (six) hours as needed for Pain 15 tablet 0   No facility-administered encounter medications on file as of 04/22/2018.        ALLERGIES:   Hydrocodone-acetaminophen and Penicillins   SOCIAL HISTORY:  Social History          Socioeconomic History  . Marital status: Single    Spouse name: Not on file  . Number of children: Not on file  . Years of education: Not on file  . Highest education level: Not on file  Occupational History  . Not on file  Social Needs  . Financial resource strain: Not on file  . Food insecurity:    Worry: Not on file    Inability: Not on file  . Transportation needs:    Medical: Not on file    Non-medical:  Not on file  Tobacco Use  . Smoking status: Former Research scientist (life sciences)  . Smokeless tobacco: Never Used  Substance and Sexual Activity  . Alcohol use: No    Alcohol/week: 0.0 oz  . Drug use: Never  . Sexual activity: Not on file  Other Topics Concern  . Not on file  Social History Narrative  . Not on file      FAMILY HISTORY:  Family History  Problem Relation Age of Onset  . High blood pressure (Hypertension) Mother   . Diabetes type II Father   . Lung cancer Father   . Diabetes type II Sister   . Alzheimer's disease Maternal Grandfather      PHYSICAL EXAM:  Vitals:   04/22/18 1419  BP: 141/78  Pulse: 76  Temp: 36.6 C (97.9 F)  .  Ht:154.9 cm (5\' 1" ) Wt:(!) 105.2 kg (231 lb 14.8 oz) UOH:FGBM surface area is 2.13 meters squared. Body mass index is 43.82 kg/m.Marland Kitchen    GENERAL: Alert, active, oriented x3  BREAST: right breast wound with tender fluctuant collection, no erythema. No active drainage. Bedside ultrasound shows a fluid collection.       IMPRESSION:     Right breast wound fluid collection, most likely abscess.   - Patient with trial of antibiotic therapy that did not improved pain or drainage.  - Patient agreed to proceed with incision and drainage.          PLAN:  1. Incision and drainage of right breast abscess (19020) 2. Use warm blanket on area 3. Do not drive if taking pain medication  Patient or representative verbalized understanding, all questions were answered, and were agreeable with the plan outlined above.   Herbert Pun, MD  Electronically signed by Herbert Pun, MD

## 2018-04-26 NOTE — Anesthesia Preprocedure Evaluation (Signed)
Anesthesia Evaluation  Patient identified by MRN, date of birth, ID band Patient awake    Reviewed: Allergy & Precautions, H&P , NPO status , Patient's Chart, lab work & pertinent test results, reviewed documented beta blocker date and time   Airway Mallampati: II  TM Distance: >3 FB Neck ROM: full    Dental  (+) Teeth Intact   Pulmonary shortness of breath and with exertion, former smoker,    Pulmonary exam normal        Cardiovascular Exercise Tolerance: Good hypertension, On Medications Normal cardiovascular exam+ Valvular Problems/Murmurs  Rate:Normal     Neuro/Psych  Headaches, PSYCHIATRIC DISORDERS Depression    GI/Hepatic Neg liver ROS, GERD  Medicated,  Endo/Other  Morbid obesity  Renal/GU negative Renal ROS  negative genitourinary   Musculoskeletal   Abdominal   Peds  Hematology  (+) Blood dyscrasia, anemia ,   Anesthesia Other Findings   Reproductive/Obstetrics negative OB ROS                             Anesthesia Physical Anesthesia Plan  ASA: III  Anesthesia Plan: General LMA   Post-op Pain Management:    Induction:   PONV Risk Score and Plan:   Airway Management Planned:   Additional Equipment:   Intra-op Plan:   Post-operative Plan:   Informed Consent: I have reviewed the patients History and Physical, chart, labs and discussed the procedure including the risks, benefits and alternatives for the proposed anesthesia with the patient or authorized representative who has indicated his/her understanding and acceptance.     Plan Discussed with: CRNA  Anesthesia Plan Comments:         Anesthesia Quick Evaluation

## 2018-04-26 NOTE — Transfer of Care (Signed)
Immediate Anesthesia Transfer of Care Note  Patient: Destiny Rogers  Procedure(s) Performed: INCISION AND DRAINAGE ABSCESS (Right Breast)  Patient Location: PACU  Anesthesia Type:General  Level of Consciousness: awake, alert  and oriented  Airway & Oxygen Therapy: Patient Spontanous Breathing and Patient connected to face mask oxygen  Post-op Assessment: Report given to RN and Post -op Vital signs reviewed and stable  Post vital signs: Reviewed and stable  Last Vitals:  Vitals Value Taken Time  BP 153/96 04/26/2018  2:33 PM  Temp 36.2 C 04/26/2018  2:33 PM  Pulse 80 04/26/2018  2:35 PM  Resp 14 04/26/2018  2:35 PM  SpO2 100 % 04/26/2018  2:35 PM  Vitals shown include unvalidated device data.  Last Pain:  Vitals:   04/26/18 1116  TempSrc: Tympanic  PainSc:       Patients Stated Pain Goal: 1 (09/12/34 1224)  Complications: No apparent anesthesia complications

## 2018-04-26 NOTE — Brief Op Note (Signed)
04/26/2018  2:36 PM  PATIENT:  Destiny Rogers  57 y.o. female  PRE-OPERATIVE DIAGNOSIS:  Right Breast Abscess  POST-OPERATIVE DIAGNOSIS:  Right Breast Abscess  PROCEDURE:  Procedure(s): INCISION AND DRAINAGE ABSCESS (Right)  SURGEON:  Surgeon(s) and Role:    Herbert Pun, MD - Primary   ANESTHESIA:   General  EBL:  25 mL

## 2018-04-26 NOTE — Op Note (Signed)
Preoperative diagnosis: Right breast abscess   Postoperative diagnosis: Right breast abscess  Procedure: Mastotomy with Incision, drainage and debridement of right breast abscess and cavity. .  Anesthesia: General  Surgeon: Dr. Windell Moment, MD  Wound Classification: Contaminated  Indications: Patient is a 57 y.o. female  presented with an abscess on the right breast in the lumpectomy site of surgery done 5 months ago. Patient did not responded to conservative management drainage with debridement was indicated.   Findings:  1. Abscess on the right breast 2. Purulent secretions drained and cultured 3. Chronic hard cavity that was not healing since 5 months ago, debrided 4. Adequate hemostasis.   Description of procedure: The patient was placed in the supine position and anesthesia was induced. The right breast was prepped and draped in the usual sterile fashion. A timeout was completed verifying correct patient, procedure, site, positioning, and implant(s) and/or special equipment prior to beginning this procedure.  A skin elliptical incision was made around the previous lumpectomy wound. The wound was opened and purulent secretions was drained. With a hemostat blunt dissection of septas was done to be able to drain the abscess completely. The scarred cavity was debrided with a curet. The the cavity was decorticated until healthy breast tissue was left. The cavity site was then irrigated with bethadine solution and peroxide mixtures. The cavity flushed with water until clear water was aspirated. The wound was approximated but left partially open with a Penrse. Sterile dressing left in place.   The patient tolerated the procedure well and was taken to the postanesthesia care unit in satisfactory condition.   Specimen: Right breast wound and abscess cavity  Complications: None  Estimated Blood Loss: 25 mL

## 2018-04-26 NOTE — Discharge Instructions (Signed)
°  Diet: Resume home heart healthy regular diet.   Activity: No heavy lifting >20 pounds (children, pets, laundry, garbage) or strenuous activity until follow-up, but light activity and walking are encouraged. Do not drive or drink alcohol if taking narcotic pain medications.  Wound care: Remove dressing before next shower. Once dressing removed, may shower with soapy water and pat dry (do not rub incisions), but no baths or submerging incision underwater until follow-up. (no swimming). Place new gauzes after shower to keep area dry. Does not need to place with tape, gauzes may be hold in place with a sport/supportive bra. However is more comfortable.   Medications: Resume all home medications. For mild to moderate pain: acetaminophen (Tylenol) or ibuprofen (if no kidney disease). Combining Tylenol with alcohol can substantially increase your risk of causing liver disease. Narcotic pain medications, if prescribed, can be used for severe pain, though may cause nausea, constipation, and drowsiness.  If you do not need the narcotic pain medication, you do not need to fill the prescription.  Call office [phone]) at any time if any questions, worsening pain, fevers/chills, bleeding, drainage from incision site, or other concerns.

## 2018-04-28 ENCOUNTER — Encounter: Payer: Self-pay | Admitting: General Surgery

## 2018-04-28 LAB — SURGICAL PATHOLOGY

## 2018-04-28 NOTE — H&P (Signed)
History and Physical Interval Note:  04/26/2018 12:30 PM  Destiny Rogers  has presented today for surgery, with the diagnosis of RIGHT BREAST ABSCESS.  The various methods of treatment have been discussed with the patient and family. After consideration of risks, benefits and other options for treatment, the patient has consented to  Procedure(s): INCISION AND DRAINAGE ABSCESS (Right) as a surgical intervention .  The patient's history has been reviewed, patient examined, no change in status, stable for surgery.  I have reviewed the patient's chart and labs.  Right breast marked in the pre procedure room. Questions were answered to the patient's satisfaction.     Herbert Pun

## 2018-04-28 NOTE — Anesthesia Postprocedure Evaluation (Signed)
Anesthesia Post Note  Patient: Destiny Rogers  Procedure(s) Performed: INCISION AND DRAINAGE ABSCESS (Right Breast)  Patient location during evaluation: PACU Anesthesia Type: General Level of consciousness: awake and alert Pain management: pain level controlled Vital Signs Assessment: post-procedure vital signs reviewed and stable Respiratory status: spontaneous breathing, nonlabored ventilation, respiratory function stable and patient connected to nasal cannula oxygen Cardiovascular status: blood pressure returned to baseline and stable Postop Assessment: no apparent nausea or vomiting Anesthetic complications: no     Last Vitals:  Vitals:   04/26/18 1535 04/26/18 1612  BP: (!) 144/84 137/72  Pulse: 64 62  Resp: 16   Temp: (!) 35.8 C   SpO2: 98% 100%    Last Pain:  Vitals:   04/26/18 1612  TempSrc:   PainSc: 4                  Molli Barrows

## 2018-05-01 LAB — AEROBIC/ANAEROBIC CULTURE (SURGICAL/DEEP WOUND)

## 2018-05-01 LAB — AEROBIC/ANAEROBIC CULTURE W GRAM STAIN (SURGICAL/DEEP WOUND): Culture: NORMAL

## 2018-05-09 ENCOUNTER — Other Ambulatory Visit: Payer: Self-pay | Admitting: Oncology

## 2018-05-30 NOTE — Progress Notes (Signed)
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  INTERVAL HISTORY: Patient returns to clinic today for routine 38-monthevaluation.  She continues to have hot flashes secondary to letrozole, but states these are mildly improved.  She recently had surgery to treat a breast abscess, but this is nearly resolved.  She otherwise feels well. She continues to be anxious. She has no neurologic complaints.  She does not complain of weakness or fatigue today.  She has a good appetite and denies weight loss. She has no chest pain or shortness of breath. She denies any nausea, vomiting, constipation, or diarrhea. She has no urinary complaints.  Patient offers no further specific complaints today.  REVIEW OF SYSTEMS:   Review of Systems  Constitutional: Negative.  Negative for fever, malaise/fatigue and weight loss.  Respiratory: Negative.  Negative for cough and shortness of breath.   Cardiovascular: Negative.  Negative for chest pain and leg swelling.  Gastrointestinal: Negative.  Negative for abdominal pain.  Genitourinary: Negative.  Negative for dysuria.  Musculoskeletal: Negative.  Negative for joint pain.  Skin: Negative.  Negative for itching and rash.  Neurological: Positive for sensory change. Negative for tingling, focal weakness and weakness.  Psychiatric/Behavioral: The patient is nervous/anxious. The  patient does not have insomnia.     As per HPI. Otherwise, a complete review of systems is negative.  PAST MEDICAL HISTORY: Past Medical History:  Diagnosis Date  . Anemia   . Arthritis   . Cancer (HAlfalfa   . Dyspnea    SINCE STARTING CHEMO WITH EXERTION  . GERD (gastroesophageal reflux disease)    NO MEDS  . Heart murmur    DX IN TEENS-ASYMPTOMATIC  . Hypertension   . Migraines    H/O MIGRAINES  . Personal history of chemotherapy 2018   finished 09/08/17  . Thumb fracture    LEFT-WEARING BRACE    PAST SURGICAL HISTORY: Past Surgical History:  Procedure Laterality Date  . ABLATION  2006  . BREAST BIOPSY Right 03/19/2017   [place] of right breast and right lymph node/ positive  . BREAST LUMPECTOMY Right 10/14/2017  . CESAREAN SECTION    . FRACTURE SURGERY Left 2013   left hand, thumb (screws and titanium)  . INCISION AND DRAINAGE ABSCESS Right 04/26/2018   Procedure: INCISION AND DRAINAGE ABSCESS;  Surgeon: CHerbert Pun MD;  Location: [facility] ORS;  Service: General;  Laterality: Right;  . PARTIAL MASTECTOMY WITH NEEDLE LOCALIZATION Right 10/14/2017   Procedure: PARTIAL MASTECTOMY WITH NEEDLE LOCALIZATION;  Surgeon: CHerbert Pun MD;  Location: [facility] ORS;  Service: General;  Laterality: Right;  . PILONIDAL CYST EXCISION  1980'S  . PORT-A-CATH REMOVAL N/A 04/27/2017   Procedure: REMOVAL OF PORT A CATH & insertion PORT A CATH LEFT INTERNAL JUGULAR;  Surgeon: CHerbert Pun MD;  Location: [facility] ORS;  Service: General;  Laterality: N/A;  . PORTACATH PLACEMENT N/A 04/15/2017   Procedure: INSERTION PORT-A-CATH;  Surgeon: CHerbert Pun MD;  Location: [facility] ORS;  Service: General;  Laterality: N/A;  .  SENTINEL NODE BIOPSY Right 10/14/2017   Procedure: SENTINEL NODE BIOPSY;  Surgeon: Herbert Pun, MD;  Location: [facility] ORS;  Service: General;  Laterality: Right;    FAMILY HISTORY: Family History  Problem Relation Age of Onset  . Breast  cancer Maternal Grandmother   . Hypertension Maternal Grandmother   . Angina Mother   . Lung cancer Father   . Diabetes Father   . Prostate cancer Maternal Uncle   . Dementia Maternal Grandfather   . Congestive Heart Failure Maternal Grandfather   . Heart disease Paternal Grandmother     ADVANCED DIRECTIVES (Y/N):  N  HEALTH MAINTENANCE: Social History   Tobacco Use  . Smoking status: Former Smoker    Packs/day: 2.00    Years: 18.00    Pack years: 36.00    Types: Cigarettes    Last attempt to quit: 05/01/2017    Years since quitting: 1.0  . Smokeless tobacco: Never Used  . Tobacco comment: quit 4 months ago  Substance Use Topics  . Alcohol use: No  . Drug use: No     Colonoscopy:  PAP:  Bone density:  Lipid panel:  Allergies  Allergen Reactions  . Hydrocodone-Acetaminophen Itching and Rash  . Penicillins Swelling, Other (See Comments) and Rash    Throat swelling Has patient had a PCN reaction causing immediate rash, facial/tongue/throat swelling, SOB or lightheadedness with hypotension: Yes Has patient had a PCN reaction causing severe rash involving mucus membranes or skin necrosis: No Has patient had a PCN reaction that required hospitalization: No Has patient had a PCN reaction occurring within the last 10 years: No If all of the above answers are "NO", then may proceed with Cephalosporin use.     Current Outpatient Medications  Medication Sig Dispense Refill  . acetaminophen (TYLENOL) 500 MG tablet Take 1,000 mg by mouth every 8 (eight) hours as needed for mild pain.    Marland Kitchen ALPRAZolam (XANAX) 0.25 MG tablet Take 1 tablet (0.25 mg total) by mouth at bedtime as needed for anxiety. 30 tablet 1  . bisoprolol-hydrochlorothiazide (ZIAC) 5-6.25 MG tablet Take 1 tablet by mouth daily.     . diclofenac sodium (VOLTAREN) 1 % GEL Apply 2 g topically 4 (four) times daily as needed (for pain in right thumb).    . fluticasone (FLONASE) 50 MCG/ACT nasal spray SPRAY 2 SPRAYS  INTO EACH NOSTRIL EVERY DAY  2  . letrozole (FEMARA) 2.5 MG tablet TAKE 1 TABLET BY MOUTH EVERY DAY 30 tablet 3  . lidocaine-prilocaine (EMLA) cream Apply to affected area once (Patient taking differently: Apply 1 application topically daily as needed (prior to port being accessed.). Apply to affected area once) 30 g 3  . Multiple Vitamins-Minerals (ADULT GUMMY PO) Take 2 tablets by mouth daily. One-A-Day Women's Multivitamin Gummies    . traMADol (ULTRAM) 50 MG tablet Take 1 tablet (50 mg total) by mouth every 6 (six) hours as needed (for pain.). 30 tablet 0  . vitamin C (ASCORBIC ACID) 500 MG tablet Take 500 mg by mouth daily.    Marland Kitchen buPROPion (WELLBUTRIN SR) 200 MG 12 hr tablet Take 200 mg by mouth 2 (two) times daily as needed.     . Calcium-Magnesium-Zinc (CAL-MAG-ZINC PO) Take 1 tablet by mouth daily.    . ferrous sulfate 325 (65 FE) MG tablet Take 325 mg by mouth daily with breakfast.    . gabapentin (NEURONTIN) 300 MG capsule TAKE 1 CAPSULE BY MOUTH THREE TIMES A DAY (Patient not taking: Reported on  04/14/2018) 270 capsule 1  . ibuprofen (ADVIL,MOTRIN) 600 MG tablet   0  . loratadine (CLARITIN) 10 MG tablet Take 10 mg by mouth daily as needed for allergies.    Marland Kitchen nystatin (MYCOSTATIN/NYSTOP) powder Apply topically 3 (three) times daily. (Patient not taking: Reported on 05/31/2018) 15 g 0  . ondansetron (ZOFRAN) 8 MG tablet Take 1 tablet (8 mg total) by mouth 2 (two) times daily as needed. (Patient not taking: Reported on 05/31/2018) 60 tablet 2  . prochlorperazine (COMPAZINE) 10 MG tablet Take 1 tablet (10 mg total) by mouth every 6 (six) hours as needed (Nausea or vomiting). (Patient not taking: Reported on 03/02/2018) 60 tablet 2  . promethazine (PHENERGAN) 25 MG suppository Place 1 suppository (25 mg total) rectally every 6 (six) hours as needed for nausea or vomiting. (Patient not taking: Reported on 03/02/2018) 30 each 0  . silver sulfADIAZINE (SILVADENE) 1 % cream Apply 1 application  topically 2 (two) times daily. (Patient not taking: Reported on 03/02/2018) 50 g 3  . triamcinolone ointment (KENALOG) 0.5 % Apply 1 application topically 2 (two) times daily. (Patient not taking: Reported on 05/31/2018) 30 g 0   No current facility-administered medications for this visit.    Facility-Administered Medications Ordered in Other Visits  Medication Dose Route Frequency Provider Last Rate Last Dose  . Influenza vac split quadrivalent PF (FLUARIX) injection 0.5 mL  0.5 mL Intramuscular Once Lloyd Huger, MD      . sodium chloride flush (NS) 0.9 % injection 10 mL  10 mL Intracatheter PRN Lloyd Huger, MD   10 mL at 07/15/17 0855    OBJECTIVE: Vitals:   05/31/18 1131  BP: (!) 145/90  Pulse: 75  Resp: 18  Temp: 97.9 F (36.6 C)     Body mass index is 39.14 kg/m.    ECOG FS:0 - Asymptomatic  General: Well-developed, well-nourished, no acute distress. Eyes: Pink conjunctiva, anicteric sclera. HEENT: Normocephalic, moist mucous membranes. Breast: Right breast with healing surgical scar.  No lumps or masses palpated. Lungs: Clear to auscultation bilaterally. Heart: Regular rate and rhythm. No rubs, murmurs, or gallops. Abdomen: Soft, nontender, nondistended. No organomegaly noted, normoactive bowel sounds. Musculoskeletal: No edema, cyanosis, or clubbing. Neuro: Alert, answering all questions appropriately. Cranial nerves grossly intact. Skin: No rashes or petechiae noted. Psych: Normal affect.  LAB RESULTS:  Lab Results  Component Value Date   NA 139 12/30/2017   K 3.7 12/30/2017   CL 106 12/30/2017   CO2 26 12/30/2017   GLUCOSE 106 (H) 12/30/2017   BUN 11 12/30/2017   CREATININE 0.68 12/30/2017   CALCIUM 9.1 12/30/2017   PROT 6.7 12/30/2017   ALBUMIN 3.7 12/30/2017   AST 18 12/30/2017   ALT 13 (L) 12/30/2017   ALKPHOS 106 12/30/2017   BILITOT 0.3 12/30/2017   GFRNONAA >60 12/30/2017   GFRAA >60 12/30/2017    Lab Results  Component Value Date    WBC 6.9 12/30/2017   NEUTROABS 4.6 12/10/2017   HGB 11.8 (L) 12/30/2017   HCT 35.2 12/30/2017   MCV 70.3 (L) 12/30/2017   PLT 235 12/30/2017     STUDIES: No results found.  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  PLAN:    1. 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  Patient  completed neoadjuvant chemotherapy with Adriamycin, Cytoxan, and Taxol on September 08, 2017.  She then underwent partial mastectomy on October 14, 2017.  Patient noted to have residual disease, therefore proceeded with adjuvant XRT which she completed on January 06, 2018.  Despite hot flashes, patient wishes to continue letrozole which she will complete 5 years of treatment in June 2024.  Return to clinic in 3 months for routine evaluation.   2.  Hot flashes: Mildly improved.  Likely secondary to letrozole.  We again discussed the possibility of switching to anastrozole, but patient declined at this time. 3.  Osteopenia: Patient had bone mineral density on February 04, 2018 which revealed a T score of -1.3.  Continue calcium and vitamin D supplementation.  Repeat in July 2020. 4.  Insomnia/anxiety: Chronic and unchanged.  Continue Xanax as needed. 5.  Neuropathy: Patient does not complain of this today. 6.  Breast abscess: Resolved.  Recent surgery did not reveal any residual malignancy. 7.  History of depression: Improved.  Continue evaluation and treatment per primary care. 8.  Clinical trials enrolled:  [id number] - BWEL: Randomized Phase III Trial Evaluating the Role of Weight Loss in Adjuvant Treatment of Overweight and Obese Women with Early Breast Cancer  [id number]: A randomized phase III double blinded placebo-controlled trial of aspirin as adjuvant therapy for HER2 negative  breast cancer: the ABC trial    Patient expressed understanding and was in agreement with this plan. She also understands that She can call clinic at any time with any questions, concerns, or complaints.   Cancer Staging Primary cancer of upper outer quadrant of right female breast [facility]) Staging form: Breast, AJCC 8th Edition - Clinical stage from 03/27/2017: Stage IB (cT1c, cN1, cM0, G2, ER: Positive, PR: Positive, HER2: Negative) - Signed by Lloyd Huger, MD on 03/27/2017 - Pathologic stage from 10/22/2017: No Stage Recommended (ypT1c, pN1a, cM0, G2, ER+, PR+, HER2-) - Signed by Lloyd Huger, MD on 10/22/2017   Lloyd Huger, MD   06/01/2018 6:59 AM

## 2018-05-31 ENCOUNTER — Inpatient Hospital Stay: Payer: Medicaid Other | Attending: Oncology | Admitting: Oncology

## 2018-05-31 ENCOUNTER — Inpatient Hospital Stay: Payer: Medicaid Other

## 2018-05-31 ENCOUNTER — Encounter: Payer: Self-pay | Admitting: Oncology

## 2018-05-31 ENCOUNTER — Other Ambulatory Visit: Payer: Self-pay

## 2018-05-31 VITALS — BP 145/90 | HR 75 | Temp 97.9°F | Resp 18 | Wt 231.6 lb

## 2018-05-31 DIAGNOSIS — C50411 Malignant neoplasm of upper-outer quadrant of right female breast: Secondary | ICD-10-CM | POA: Diagnosis present

## 2018-05-31 DIAGNOSIS — Z170 Estrogen receptor positive status [ER+]: Secondary | ICD-10-CM | POA: Insufficient documentation

## 2018-05-31 DIAGNOSIS — Z23 Encounter for immunization: Secondary | ICD-10-CM

## 2018-05-31 DIAGNOSIS — Z452 Encounter for adjustment and management of vascular access device: Secondary | ICD-10-CM | POA: Insufficient documentation

## 2018-05-31 DIAGNOSIS — M8580 Other specified disorders of bone density and structure, unspecified site: Secondary | ICD-10-CM | POA: Diagnosis not present

## 2018-05-31 DIAGNOSIS — Z79811 Long term (current) use of aromatase inhibitors: Secondary | ICD-10-CM | POA: Diagnosis not present

## 2018-05-31 DIAGNOSIS — Z95828 Presence of other vascular implants and grafts: Secondary | ICD-10-CM

## 2018-05-31 MED ORDER — SODIUM CHLORIDE 0.9% FLUSH
10.0000 mL | INTRAVENOUS | Status: DC | PRN
  Administered 2018-05-31: 10 mL via INTRAVENOUS
  Filled 2018-05-31: qty 10

## 2018-05-31 MED ORDER — HEPARIN SOD (PORK) LOCK FLUSH 100 UNIT/ML IV SOLN
500.0000 [IU] | Freq: Once | INTRAVENOUS | Status: AC
  Administered 2018-05-31: 500 [IU] via INTRAVENOUS

## 2018-05-31 MED ORDER — INFLUENZA VAC SPLIT QUAD 0.5 ML IM SUSY
0.5000 mL | PREFILLED_SYRINGE | Freq: Once | INTRAMUSCULAR | Status: AC
  Administered 2018-06-01: 0.5 mL via INTRAMUSCULAR
  Filled 2018-05-31: qty 0.5

## 2018-05-31 NOTE — Progress Notes (Signed)
Patient here for follow up. Complains of swelling from thighs to feet, Pressure beneath right breast, and right shoulder pain/pressure.

## 2018-06-01 DIAGNOSIS — C50411 Malignant neoplasm of upper-outer quadrant of right female breast: Secondary | ICD-10-CM | POA: Diagnosis not present

## 2018-06-16 ENCOUNTER — Encounter: Payer: Self-pay | Admitting: *Deleted

## 2018-06-16 DIAGNOSIS — C50411 Malignant neoplasm of upper-outer quadrant of right female breast: Secondary | ICD-10-CM

## 2018-06-16 NOTE — Progress Notes (Signed)
Patient Destiny Rogers, on the [employer] [phone] ABC study, returned to clinic this afternoon for purpose of picking up her 2nd bottle of study drug for this cycle. Patient was originally dispensed one bottle of study drug, which only contained 90 tablets of Aspirin or placebo. Patient states she still has a few pills left because she had to hold the drug briefly for a surgical procedure, but states this is documented in her medication diary. Verified that she has diaries to last the entire 6 month cycle and will bring these back along with her pill bottles at her next appointment in February of 2020. Patient was to come yesterday for her new bottle of pills, which was dispensed from the pharmacy yesterday, but this note will serve as documentation that she actually received the drug today. Patient reports she is tolerating the study drug well and denies any current side effects from it. Yolande Jolly, BSN, MHA, OCN 06/16/2018 2:32 PM

## 2018-07-30 ENCOUNTER — Other Ambulatory Visit: Payer: Self-pay

## 2018-07-30 DIAGNOSIS — C50411 Malignant neoplasm of upper-outer quadrant of right female breast: Secondary | ICD-10-CM

## 2018-07-30 NOTE — Progress Notes (Signed)
[facility]

## 2018-08-05 ENCOUNTER — Other Ambulatory Visit: Payer: Self-pay

## 2018-08-05 ENCOUNTER — Encounter: Payer: Self-pay | Admitting: Radiation Oncology

## 2018-08-05 ENCOUNTER — Inpatient Hospital Stay: Payer: Medicaid Other | Attending: Oncology

## 2018-08-05 ENCOUNTER — Ambulatory Visit
Admission: RE | Admit: 2018-08-05 | Discharge: 2018-08-05 | Disposition: A | Discharge status: Home / Self Care | Payer: Medicaid Other | Source: Ambulatory Visit | Attending: Radiation Oncology | Admitting: Radiation Oncology

## 2018-08-05 VITALS — BP 140/85 | HR 77 | Temp 98.4°F | Resp 18 | Wt 244.5 lb

## 2018-08-05 DIAGNOSIS — C801 Malignant (primary) neoplasm, unspecified: Secondary | ICD-10-CM

## 2018-08-05 DIAGNOSIS — Z923 Personal history of irradiation: Secondary | ICD-10-CM | POA: Diagnosis not present

## 2018-08-05 DIAGNOSIS — Z79811 Long term (current) use of aromatase inhibitors: Secondary | ICD-10-CM | POA: Insufficient documentation

## 2018-08-05 DIAGNOSIS — R5383 Other fatigue: Secondary | ICD-10-CM | POA: Diagnosis not present

## 2018-08-05 DIAGNOSIS — Z452 Encounter for adjustment and management of vascular access device: Secondary | ICD-10-CM | POA: Insufficient documentation

## 2018-08-05 DIAGNOSIS — Z170 Estrogen receptor positive status [ER+]: Secondary | ICD-10-CM | POA: Diagnosis not present

## 2018-08-05 DIAGNOSIS — C50411 Malignant neoplasm of upper-outer quadrant of right female breast: Secondary | ICD-10-CM | POA: Diagnosis not present

## 2018-08-05 MED ORDER — HEPARIN SOD (PORK) LOCK FLUSH 100 UNIT/ML IV SOLN
500.0000 [IU] | Freq: Once | INTRAVENOUS | Status: AC
  Administered 2018-08-05: 500 [IU] via INTRAVENOUS
  Filled 2018-08-05: qty 5

## 2018-08-05 MED ORDER — SODIUM CHLORIDE 0.9% FLUSH
10.0000 mL | INTRAVENOUS | Status: DC | PRN
  Administered 2018-08-05: 10 mL via INTRAVENOUS
  Filled 2018-08-05: qty 10

## 2018-08-05 NOTE — Progress Notes (Signed)
Radiation Oncology Follow up Note  Name: Destiny Rogers   Date:   08/05/2018 MRN:  [id number] DOB: 09/03/1960    This 57 y.o. female presents to the clinic today for 6 month follow-up status post whole breast radiation to her right breast for stage IIa invasive mammary carcinoma ER/PR positive..  REFERRING PROVIDER: Hedrick, James, MD  HPI: patient is a 57-year-old female now 6 months out having completed whole breast radiation to her right breast for stage IIa ER/PR positive HER-2/neu negative invasive mammary carcinoma she had neoadjuvant chemotherapy followed by wide local excision and whole breast radiation seen today in routine follow-up she is doing fairly well still has some fatigue most likely postchemotherapy in nature. She is currently on.Femara tolerating that well. She has put on some weight which she attributes to the antiestrogen therapy. She is not yet had mammogram. She specifically denies breast tenderness cough or bone pain.she did have back in October a reexcision negative for atypia or malignancy in the previous lumpectomy site.  COMPLICATIONS OF TREATMENT: none  FOLLOW UP COMPLIANCE: keeps appointments   PHYSICAL EXAM:  BP 140/85   Pulse 77   Temp 98.4 F (36.9 C)   Resp 18   Wt 244 lb 7.8 oz (110.9 kg)   LMP 04/09/2005 Comment: Tubal Ligation  BMI 41.32 kg/m  Lungs are clear to A&P cardiac examination essentially unremarkable with regular rate and rhythm. No dominant mass or nodularity is noted in either breast in 2 positions examined. Incision is well-healed. No axillary or supraclavicular adenopathy is appreciated. Cosmetic result is excellent.Well-developed well-nourished patient in NAD. HEENT reveals PERLA, EOMI, discs not visualized.  Oral cavity is clear. No oral mucosal lesions are identified. Neck is clear without evidence of cervical or supraclavicular adenopathy. Lungs are clear to A&P. Cardiac examination is essentially unremarkable with regular rate and  rhythm without murmur rub or thrill. Abdomen is benign with no organomegaly or masses noted. Motor sensory and DTR levels are equal and symmetric in the upper and lower extremities. Cranial nerves II through XII are grossly intact. Proprioception is intact. No peripheral adenopathy or edema is identified. No motor or sensory levels are noted. Crude visual fields are within normal range.  RADIOLOGY RESULTS: no current films for review  PLAN: present time she is doing well I've assured her that she still having some fatigue most likely related postchemotherapy. I have suggested exercise which she is trying to do. She has been on some weight and is entertaining dieting. She continues on Femara without side effect. I have asked to see her back in 6 months for follow-up. I anticipate a mammogram in that time interval. Patient knows to call with any concerns.  I would like to take this opportunity to thank you for allowing me to participate in the care of your patient..    Glenn Chrystal, MD   

## 2018-09-05 NOTE — Progress Notes (Signed)
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  INTERVAL HISTORY: Patient returns to clinic today for routine 55-monthevaluation.  She continues to have hot flashes secondary letrozole, but admits they have improved.  She otherwise feels well and is asymptomatic.  She continues to be anxious. She has no neurologic complaints.  She does not complain of weakness or fatigue today.  She has a good appetite and denies weight loss. She has no chest pain or shortness of breath. She denies any nausea, vomiting, constipation, or diarrhea. She has no urinary complaints.  Patient feels at her baseline offers no specific complaints today.  REVIEW OF SYSTEMS:   Review of Systems  Constitutional: Negative.  Negative for fever, malaise/fatigue and weight loss.  Respiratory: Negative.  Negative for cough and shortness of breath.   Cardiovascular: Negative.  Negative for chest pain and leg swelling.  Gastrointestinal: Negative.  Negative for abdominal pain.  Genitourinary: Negative.  Negative for dysuria.  Musculoskeletal: Negative.  Negative for joint pain.  Skin: Negative.  Negative for itching and rash.  Neurological: Positive for sensory change. Negative for tingling, focal weakness and weakness.  Psychiatric/Behavioral: The patient is nervous/anxious. The patient does not have insomnia.     As per HPI.  Otherwise, a complete review of systems is negative.  PAST MEDICAL HISTORY: Past Medical History:  Diagnosis Date  . Anemia   . Arthritis   [place]   . Dyspnea    SINCE STARTING CHEMO WITH EXERTION  . GERD (gastroesophageal reflux disease)    NO MEDS  . Heart murmur    DX IN TEENS-ASYMPTOMATIC  . Hypertension   . Migraines    H/O MIGRAINES  . Personal history of chemotherapy 2018   finished 09/08/17  . Thumb fracture    LEFT-WEARING BRACE    PAST SURGICAL HISTORY: Past Surgical History:  Procedure Laterality Date  . ABLATION  2006  . BREAST BIOPSY Right 03/19/2017   [place] breast and right lymph node/ positive  . BREAST LUMPECTOMY Right 10/14/2017  . CESAREAN SECTION    . FRACTURE SURGERY Left 2013   left hand, thumb (screws and titanium)  . INCISION AND DRAINAGE ABSCESS Right 04/26/2018   Procedure: INCISION AND DRAINAGE ABSCESS;  Surgeon: CHerbert Pun MD;  Location: [facility] OR[facility];  Service: General;  Laterality: Right;  . PARTIAL MASTECTOMY WITH NEEDLE LOCALIZATION Right 10/14/2017   Procedure: PARTIAL MASTECTOMY WITH NEEDLE LOCALIZATION;  Surgeon: CHerbert Pun MD;  Location: [facility] OR[facility];  Service: General;  Laterality: Right;  . PILONIDAL CYST EXCISION  1980'S  . PORT-A-CATH REMOVAL N/A 04/27/2017   Procedure: REMOVAL OF PORT A CATH & insertion PORT A CATH LEFT INTERNAL JUGULAR;  Surgeon: CHerbert Pun MD;  Location: [facility] OR[facility];  Service: General;  Laterality: N/A;  . PORTACATH PLACEMENT N/A 04/15/2017   Procedure: INSERTION PORT-A-CATH;  Surgeon: CHerbert Pun MD;  Location: [facility] OR[facility];  Service: General;  Laterality: N/A;  . SENTINEL NODE BIOPSY Right 10/14/2017   Procedure:  SENTINEL NODE BIOPSY;  Surgeon: Herbert Pun, MD;  Location: [facility];  Service: General;  Laterality: Right;    FAMILY HISTORY: Family History  Problem Relation Age of Onset  . Breast cancer Maternal Grandmother   . Hypertension  Maternal Grandmother   . Angina Mother   . Lung cancer Father   . Diabetes Father   . Prostate cancer Maternal Uncle   . Dementia Maternal Grandfather   . Congestive Heart Failure Maternal Grandfather   . Heart disease Paternal Grandmother     ADVANCED DIRECTIVES (Y/N):  N  HEALTH MAINTENANCE: Social History   Tobacco Use  . Smoking status: Former Smoker    Packs/day: 2.00    Years: 18.00    Pack years: 36.00    Types: Cigarettes    Last attempt to quit: 05/01/2017    Years since quitting: 1.3  . Smokeless tobacco: Never Used  . Tobacco comment: quit 4 months ago  Substance Use Topics  . Alcohol use: No  . Drug use: No     Colonoscopy:  PAP:  Bone density:  Lipid panel:  Allergies  Allergen Reactions  . Hydrocodone-Acetaminophen Itching and Rash  . Penicillins Swelling, Other (See Comments) and Rash    Throat swelling Has patient had a PCN reaction causing immediate rash, facial/tongue/throat swelling, SOB or lightheadedness with hypotension: Yes Has patient had a PCN reaction causing severe rash involving mucus membranes or skin necrosis: No Has patient had a PCN reaction that required hospitalization: No Has patient had a PCN reaction occurring within the last 10 years: No If all of the above answers are "NO", then may proceed with Cephalosporin use.     Current Outpatient Medications  Medication Sig Dispense Refill  . acetaminophen (TYLENOL) 500 MG tablet Take 1,000 mg by mouth every 8 (eight) hours as needed for mild pain.    Marland Kitchen ALPRAZolam (XANAX) 0.25 MG tablet Take 1 tablet (0.25 mg total) by mouth at bedtime as needed for anxiety. 30 tablet 1  . bisoprolol-hydrochlorothiazide (ZIAC) 5-6.25 MG tablet Take 1 tablet by mouth daily.     Marland Kitchen buPROPion (WELLBUTRIN SR) 200 MG 12 hr tablet Take 200 mg by mouth 2 (two) times daily as needed.     . Calcium-Magnesium-Zinc (CAL-MAG-ZINC PO) Take 1 tablet by mouth daily.    . citalopram (CELEXA) 10 MG tablet Take 10 mg  by mouth daily.    . diclofenac sodium (VOLTAREN) 1 % GEL Apply 2 g topically 4 (four) times daily as needed (for pain in right thumb).    . ferrous sulfate 325 (65 FE) MG tablet Take 325 mg by mouth daily with breakfast.    . fluticasone (FLONASE) 50 MCG/ACT nasal spray SPRAY 2 SPRAYS INTO EACH NOSTRIL EVERY DAY  2  . gabapentin (NEURONTIN) 300 MG capsule TAKE 1 CAPSULE BY MOUTH THREE TIMES A DAY 270 capsule 1  . hydrochlorothiazide (HYDRODIURIL) 12.5 MG tablet Take by mouth.    Marland Kitchen ibuprofen (ADVIL,MOTRIN) 600 MG tablet   0  . letrozole (FEMARA) 2.5 MG tablet TAKE 1 TABLET BY MOUTH EVERY DAY 30 tablet 3  . lidocaine-prilocaine (EMLA) cream Apply to affected area once (Patient taking differently: Apply 1 application topically daily as needed (prior to port being accessed.). Apply to affected area once) 30 g 3  . loratadine (CLARITIN) 10 MG tablet Take 10 mg by mouth daily as needed for allergies.    . Multiple Vitamins-Minerals (ADULT GUMMY PO) Take 2 tablets by mouth daily. One-A-Day Women's Multivitamin  Gummies    . nystatin (MYCOSTATIN/NYSTOP) powder Apply topically 3 (three) times daily. 15 g 0  . ondansetron (ZOFRAN) 8 MG tablet Take 1 tablet (8 mg total) by mouth 2 (two) times daily as needed. 60 tablet 2  . prochlorperazine (COMPAZINE) 10 MG tablet Take 1 tablet (10 mg total) by mouth every 6 (six) hours as needed (Nausea or vomiting). 60 tablet 2  . promethazine (PHENERGAN) 25 MG suppository Place 1 suppository (25 mg total) rectally every 6 (six) hours as needed for nausea or vomiting. 30 each 0  . silver sulfADIAZINE (SILVADENE) 1 % cream Apply 1 application topically 2 (two) times daily. 50 g 3  . traMADol (ULTRAM) 50 MG tablet Take 1 tablet (50 mg total) by mouth every 6 (six) hours as needed (for pain.). 30 tablet 0  . triamcinolone ointment (KENALOG) 0.5 % Apply 1 application topically 2 (two) times daily. 30 g 0  . vitamin C (ASCORBIC ACID) 500 MG tablet Take 500 mg by mouth daily.       No current facility-administered medications for this visit.    Facility-Administered Medications Ordered in Other Visits  Medication Dose Route Frequency Provider Last Rate Last Dose  . sodium chloride flush (NS) 0.9 % injection 10 mL  10 mL Intracatheter PRN Lloyd Huger, MD   10 mL at 07/15/17 0855    OBJECTIVE: Vitals:   09/07/18 0857  BP: (!) 165/89  Pulse: 97  Temp: 97.6 F (36.4 C)     Body mass index is 42.06 kg/m.    ECOG FS:0 - Asymptomatic  General: Well-developed, well-nourished, no acute distress. Eyes: Pink conjunctiva, anicteric sclera. HEENT: Normocephalic, moist mucous membranes. Breast: Bilateral breast and axilla without lumps or masses. Lungs: Clear to auscultation bilaterally. Heart: Regular rate and rhythm. No rubs, murmurs, or gallops. Abdomen: Soft, nontender, nondistended. No organomegaly noted, normoactive bowel sounds. Musculoskeletal: No edema, cyanosis, or clubbing. Neuro: Alert, answering all questions appropriately. Cranial nerves grossly intact. Skin: No rashes or petechiae noted. Psych: Normal affect.  LAB RESULTS:  Lab Results  Component Value Date   NA 139 12/30/2017   K 3.7 12/30/2017   CL 106 12/30/2017   CO2 26 12/30/2017   GLUCOSE 106 (H) 12/30/2017   BUN 11 12/30/2017   CREATININE 0.68 12/30/2017   CALCIUM 9.1 12/30/2017   PROT 6.7 12/30/2017   ALBUMIN 3.7 12/30/2017   AST 18 12/30/2017   ALT 13 (L) 12/30/2017   ALKPHOS 106 12/30/2017   BILITOT 0.3 12/30/2017   GFRNONAA >60 12/30/2017   GFRAA >60 12/30/2017    Lab Results  Component Value Date   WBC 6.9 12/30/2017   NEUTROABS 4.6 12/10/2017   HGB 11.8 (L) 12/30/2017   HCT 35.2 12/30/2017   MCV 70.3 (L) 12/30/2017   PLT 235 12/30/2017     STUDIES: No results found.  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  PLAN:    1. Clinical stage IB ER/PR positive,  HER-2/neu negative invasive carcinoma of the upper outer quadrant of the right breast: (Clinical trial purposes patient is stage IIa using AJCC version 7 staging).  Patient completed neoadjuvant chemotherapy with Adriamycin, Cytoxan, and Taxol on September 08, 2017.  She then underwent partial mastectomy on October 14, 2017.  Patient noted to have residual disease, therefore proceeded with adjuvant XRT which she completed on January 06, 2018.  Continue letrozole for total 5 years completing treatment in June 2024.  Patient will require repeat mammogram in March 2020.  Return to clinic in 6 months for routine evaluation.   2.  Hot flashes: Chronic and relatively unchanged. Likely secondary to letrozole.  We again discussed the possibility of switching to anastrozole, but patient declined at this time. 3.  Osteopenia: Patient had bone mineral density on February 04, 2018 which revealed a T score of -1.3.  Continue calcium and vitamin D supplementation.  Repeat in July 2020. 4.  Insomnia/anxiety: Chronic and unchanged.  Continue Xanax as needed. 5.  Neuropathy: Patient does not complain of this today. 6  Clinical trials enrolled:  [id number] - BWEL: Randomized Phase III Trial Evaluating the Role of Weight Loss in Adjuvant Treatment of Overweight and Obese Women with Early Breast Cancer  [id number]: A randomized phase III double blinded placebo-controlled trial of aspirin as adjuvant therapy for HER2 negative breast cancer: the ABC trial  I spent a total of 30 minutes face-to-face with the patient of which greater than 50% of the visit was spent in counseling and coordination of care as detailed above.   Patient expressed understanding and was in agreement with this plan. She also understands that She can call clinic at any time with any questions, concerns, or complaints.   Cancer Staging Primary cancer of upper outer quadrant of right female breast [facility]) Staging form: Breast, AJCC 8th Edition - Clinical stage  from 03/27/2017: Stage IB (cT1c, cN1, cM0, G2, ER: Positive, PR: Positive, HER2: Negative) - Signed by Lloyd Huger, MD on 03/27/2017 - Pathologic stage from 10/22/2017: No Stage Recommended (ypT1c, pN1a, cM0, G2, ER+, PR+, HER2-) - Signed by Lloyd Huger, MD on 10/22/2017   Lloyd Huger, MD   09/08/2018 12:20 PM

## 2018-09-06 ENCOUNTER — Other Ambulatory Visit: Payer: Self-pay | Admitting: *Deleted

## 2018-09-06 DIAGNOSIS — C50411 Malignant neoplasm of upper-outer quadrant of right female breast: Secondary | ICD-10-CM

## 2018-09-07 ENCOUNTER — Encounter: Payer: Self-pay | Admitting: *Deleted

## 2018-09-07 ENCOUNTER — Inpatient Hospital Stay: Payer: Medicaid Other

## 2018-09-07 ENCOUNTER — Other Ambulatory Visit: Payer: Self-pay

## 2018-09-07 ENCOUNTER — Inpatient Hospital Stay: Payer: Medicaid Other | Attending: Oncology | Admitting: Oncology

## 2018-09-07 VITALS — BP 165/89 | HR 97 | Temp 97.6°F | Ht 64.5 in | Wt 248.9 lb

## 2018-09-07 DIAGNOSIS — Z79811 Long term (current) use of aromatase inhibitors: Secondary | ICD-10-CM | POA: Insufficient documentation

## 2018-09-07 DIAGNOSIS — Z923 Personal history of irradiation: Secondary | ICD-10-CM | POA: Diagnosis not present

## 2018-09-07 DIAGNOSIS — N951 Menopausal and female climacteric states: Secondary | ICD-10-CM | POA: Insufficient documentation

## 2018-09-07 DIAGNOSIS — F419 Anxiety disorder, unspecified: Secondary | ICD-10-CM | POA: Diagnosis not present

## 2018-09-07 DIAGNOSIS — M8580 Other specified disorders of bone density and structure, unspecified site: Secondary | ICD-10-CM | POA: Diagnosis not present

## 2018-09-07 DIAGNOSIS — Z170 Estrogen receptor positive status [ER+]: Secondary | ICD-10-CM | POA: Diagnosis not present

## 2018-09-07 DIAGNOSIS — C50411 Malignant neoplasm of upper-outer quadrant of right female breast: Secondary | ICD-10-CM

## 2018-09-07 DIAGNOSIS — G4700 Insomnia, unspecified: Secondary | ICD-10-CM | POA: Diagnosis not present

## 2018-09-07 DIAGNOSIS — Z95828 Presence of other vascular implants and grafts: Secondary | ICD-10-CM

## 2018-09-07 DIAGNOSIS — Z9221 Personal history of antineoplastic chemotherapy: Secondary | ICD-10-CM | POA: Diagnosis not present

## 2018-09-07 DIAGNOSIS — Z87891 Personal history of nicotine dependence: Secondary | ICD-10-CM | POA: Diagnosis not present

## 2018-09-07 DIAGNOSIS — Z79899 Other long term (current) drug therapy: Secondary | ICD-10-CM | POA: Diagnosis not present

## 2018-09-07 DIAGNOSIS — Z006 Encounter for examination for normal comparison and control in clinical research program: Secondary | ICD-10-CM | POA: Insufficient documentation

## 2018-09-07 LAB — GLUCOSE, FASTING: Glucose, Fasting: 118 mg/dL — ABNORMAL HIGH (ref 70–99)

## 2018-09-07 MED ORDER — SODIUM CHLORIDE 0.9% FLUSH
10.0000 mL | INTRAVENOUS | Status: DC | PRN
  Administered 2018-09-07: 10 mL via INTRAVENOUS
  Filled 2018-09-07: qty 10

## 2018-09-07 MED ORDER — HEPARIN SOD (PORK) LOCK FLUSH 100 UNIT/ML IV SOLN
INTRAVENOUS | Status: AC
  Filled 2018-09-07: qty 5

## 2018-09-07 MED ORDER — HEPARIN SOD (PORK) LOCK FLUSH 100 UNIT/ML IV SOLN
500.0000 [IU] | Freq: Once | INTRAVENOUS | Status: AC
  Administered 2018-09-07: 500 [IU] via INTRAVENOUS

## 2018-09-07 NOTE — Research (Signed)
Destiny Rogers presented to clinic this morning for 6 month follow up on the [id number] [facility] research study. Patient reports she has been doing well and voices compliance to her daily study drug: Aspirin/placebo. Reviewed 6 month solicited AEs as noted below. She denies further dyspepsia, but does report occasional mild bruising that did not begin with Aspirin/placebo treatment, but she describes herself as bruising easily. She also reports some increasing edema in her bilateral ankles - trace - 1+ and worse at night than in the mornings. She did not have an onset date for this. Dr. Grayland Ormond was in to perform H&P and breast exam. He is also ordering patient's mammogram, which is due in March. Destiny Rogers returned her medication calendars, which validate med compliance with the exception of a one week period of time when her dentist had instructed her to stop taking any aspirin containing medications prior to her appointment with him. States she forgot to return her old bottle of study drug, but there are still pills in that bottle. Instructed not to take any more study drug from that bottle as it will expire next month. Instructed to begin taking her IP from the new bottle that was dispensed to her today, and to return the old bottle when she comes for her mammogram in 2 weeks. Study drug dispensed by pharmacy after validating with Destiny Rogers the patient specific IP: aspirin/placebo 300mg  tabs was labeled with correct patient study ID number [id number]. Patient was dispensed only 200 tablets this visit; which will last until her return appointment in 6 months. Patient was given study-specific medication calendars for the next 6 months and instructed to document the time and date that she takes her study drug each day. She is aware to take one caplet at approx. the same time each day with food and not to crush the pill.   Adverse Event Log  Study/Protocol: [id number] - ABC  Cycle: 6 month  Event Grade Onset Date  Resolved Date Drug Name Aspirin/placebo Attribution Treatment Comments  Gastrointestinal Bleeding 0        Intracranial Hemorrhage 0        Epistaxis 0        Hematuria 0        Dyspepsia 1 04/21/2017 09/07/2018    unrelated  none Denies at present  Gastritis 0        Bruising 1 reported 09/07/2018     reports hx of bruising easily  Pedal edema 1 unknown   unrelated  none States this has worsened  Hot flashes 1 unknown   Letrozole    Arthralgias 1 unknown   Letrozole  Pain in hands  Yolande Jolly, BSN, MHA, OCN 09/07/2018  10:04 AM

## 2018-09-07 NOTE — Progress Notes (Signed)
Patient is here today to follow up on her right breast cancer. Patient stated that she has joint and back pain all the time.

## 2018-09-07 NOTE — Research (Signed)
Destiny Rogers presented to clinic this morning for 6 month follow up on the [id number] BWEL research study. Patient reports she has been trying to diet, but the less she eats, the more weight she seems to gain. Reviewed 6 month solicited AEs as noted below. She also reports some increasing edema in her bilateral ankles - trace - 1+ and worse at night than in the mornings. She did not have an onset date for this. Mild hot flashes and arthralgias were also reported and likely related to Letrozole. Dr. Grayland Ormond was in to perform H&P and breast exam. He is also ordering patient's mammogram, which is due in March. [name] completed the 6 month BWEL study questionnaire while in clinic and had her fasting study labs and glucose collected this morning as well. [name] verified that she has been fasting for 12 hours prior to blood collection via port-a-cath this morning. Patient height and weight were checked twice per height and weight protocol with patient removing shoes and wearing light clothing. Height measured 165cm and 164.5cm respectively. Weight was 112.9kg and 112.9kg after having patient step off scales, then resetting for second weight check. Waist measured 112cm x 2  at narrowest section between the lowest rib and the iliac crest and hips measured 137cm x 2 at maximum circumference while patient standing straight up with feet approximately 10cm apart. B/P was 135/75 by this RN while patient relaxed and sitting at rest for more than 5 minutes with back straight and feet flat on the floor. HR was 80 bpm. Earlier B/P was 165/89 and HR was 97. Solicited and other AE's with grade and attribution were assessed as noted below:  Adverse Event Log  Study/Protocol: [id number] BWEL Cycle: 6 month Event Grade Onset Date Resolved Date Drug Name Aspirin/placebo Attribution Treatment Comments  Fractures 0       Solicited AE  Sprains 0      Solicited AE  Tendon or ligament injury 0        Solicited AE   Orthopedic surgery 0        Solicited AE    *OTHER AE's*           Pedal edema 1 unknown   unrelated  none States this has worsened  Hot flashes 1 unknown   Letrozole  none   Arthralgias 1 unknown   Letrozole  none Pain in hands  Bruising 1 unknown   unrelated  none Reports hx of bruising easily  Yolande Jolly, BSN, MHA, OCN 09/07/2018  10:00 AM

## 2018-09-08 ENCOUNTER — Other Ambulatory Visit: Payer: Self-pay | Admitting: Oncology

## 2018-09-21 ENCOUNTER — Ambulatory Visit
Admission: RE | Admit: 2018-09-21 | Discharge: 2018-09-21 | Disposition: A | Discharge status: Home / Self Care | Payer: Medicaid Other | Source: Ambulatory Visit | Attending: Oncology | Admitting: Oncology

## 2018-09-21 DIAGNOSIS — C50411 Malignant neoplasm of upper-outer quadrant of right female breast: Secondary | ICD-10-CM | POA: Diagnosis not present

## 2018-09-21 HISTORY — DX: Personal history of irradiation: Z92.3

## 2018-09-21 HISTORY — DX: Malignant neoplasm of unspecified site of unspecified female breast: C50.919

## 2018-11-06 ENCOUNTER — Other Ambulatory Visit: Payer: Self-pay | Admitting: Nurse Practitioner

## 2018-11-23 ENCOUNTER — Other Ambulatory Visit: Payer: Self-pay

## 2018-11-24 ENCOUNTER — Other Ambulatory Visit: Payer: Self-pay

## 2018-11-24 ENCOUNTER — Inpatient Hospital Stay: Payer: Medicaid Other | Attending: Oncology

## 2018-11-24 DIAGNOSIS — Z452 Encounter for adjustment and management of vascular access device: Secondary | ICD-10-CM | POA: Diagnosis not present

## 2018-11-24 DIAGNOSIS — Z170 Estrogen receptor positive status [ER+]: Secondary | ICD-10-CM | POA: Diagnosis present

## 2018-11-24 DIAGNOSIS — C50411 Malignant neoplasm of upper-outer quadrant of right female breast: Secondary | ICD-10-CM | POA: Insufficient documentation

## 2018-11-24 DIAGNOSIS — Z95828 Presence of other vascular implants and grafts: Secondary | ICD-10-CM

## 2018-11-24 MED ORDER — HEPARIN SOD (PORK) LOCK FLUSH 100 UNIT/ML IV SOLN
500.0000 [IU] | Freq: Once | INTRAVENOUS | Status: AC
  Administered 2018-11-24: 500 [IU] via INTRAVENOUS

## 2018-11-24 MED ORDER — SODIUM CHLORIDE 0.9% FLUSH
10.0000 mL | Freq: Once | INTRAVENOUS | Status: AC
  Administered 2018-11-24: 10 mL via INTRAVENOUS
  Filled 2018-11-24: qty 10

## 2019-01-12 DIAGNOSIS — Z6841 Body Mass Index (BMI) 40.0 and over, adult: Secondary | ICD-10-CM | POA: Insufficient documentation

## 2019-01-17 ENCOUNTER — Other Ambulatory Visit: Payer: Self-pay | Admitting: Orthopedic Surgery

## 2019-01-17 DIAGNOSIS — M4316 Spondylolisthesis, lumbar region: Secondary | ICD-10-CM

## 2019-02-02 ENCOUNTER — Ambulatory Visit
Admission: RE | Admit: 2019-02-02 | Discharge: 2019-02-02 | Disposition: A | Discharge status: Home / Self Care | Payer: Medicaid Other | Source: Ambulatory Visit | Attending: Orthopedic Surgery | Admitting: Orthopedic Surgery

## 2019-02-02 ENCOUNTER — Other Ambulatory Visit: Payer: Self-pay

## 2019-02-02 DIAGNOSIS — M4316 Spondylolisthesis, lumbar region: Secondary | ICD-10-CM | POA: Diagnosis not present

## 2019-02-15 ENCOUNTER — Other Ambulatory Visit: Payer: Self-pay

## 2019-02-16 ENCOUNTER — Other Ambulatory Visit: Payer: Self-pay

## 2019-02-16 ENCOUNTER — Ambulatory Visit
Admission: RE | Admit: 2019-02-16 | Discharge: 2019-02-16 | Disposition: A | Discharge status: Home / Self Care | Payer: Medicaid Other | Source: Ambulatory Visit | Attending: Radiation Oncology | Admitting: Radiation Oncology

## 2019-02-16 ENCOUNTER — Encounter: Payer: Self-pay | Admitting: Radiation Oncology

## 2019-02-16 ENCOUNTER — Inpatient Hospital Stay: Payer: Medicaid Other | Attending: Oncology

## 2019-02-16 VITALS — BP 132/77 | HR 82 | Temp 97.6°F | Wt 256.9 lb

## 2019-02-16 DIAGNOSIS — C50411 Malignant neoplasm of upper-outer quadrant of right female breast: Secondary | ICD-10-CM | POA: Insufficient documentation

## 2019-02-16 DIAGNOSIS — Z170 Estrogen receptor positive status [ER+]: Secondary | ICD-10-CM | POA: Diagnosis not present

## 2019-02-16 DIAGNOSIS — M549 Dorsalgia, unspecified: Secondary | ICD-10-CM | POA: Insufficient documentation

## 2019-02-16 DIAGNOSIS — Z452 Encounter for adjustment and management of vascular access device: Secondary | ICD-10-CM | POA: Insufficient documentation

## 2019-02-16 DIAGNOSIS — Z923 Personal history of irradiation: Secondary | ICD-10-CM | POA: Insufficient documentation

## 2019-02-16 DIAGNOSIS — Z79811 Long term (current) use of aromatase inhibitors: Secondary | ICD-10-CM | POA: Diagnosis not present

## 2019-02-16 DIAGNOSIS — Z95828 Presence of other vascular implants and grafts: Secondary | ICD-10-CM

## 2019-02-16 MED ORDER — SODIUM CHLORIDE 0.9% FLUSH
10.0000 mL | Freq: Once | INTRAVENOUS | Status: AC
  Administered 2019-02-16: 10 mL via INTRAVENOUS
  Filled 2019-02-16: qty 10

## 2019-02-16 MED ORDER — HEPARIN SOD (PORK) LOCK FLUSH 100 UNIT/ML IV SOLN
500.0000 [IU] | Freq: Once | INTRAVENOUS | Status: AC
  Administered 2019-02-16: 10:00:00 500 [IU] via INTRAVENOUS

## 2019-02-16 NOTE — Progress Notes (Signed)
Radiation Oncology Follow up Note  Name: Destiny Rogers   Date:   02/16/2019 MRN:  [id number] DOB: 1960-10-25    This 58 y.o. female presents to the clinic today for 61-monthfollow-up status post whole breast radiation to her right breast for stage IIa invasive mammary carcinoma ER PR positive.  REFERRING PROVIDER: HMaryland Pink MD  HPI: Patient is a 58year old female now at 142months having completed radiation therapy to her right breast for stage IIa ER PR positive HER-2 negative invasive mammary carcinoma.  She had neoadjuvant chemotherapy followed by wide local excision.  Seen today in routine follow-up she is doing well.  She is specifically denies breast tenderness cough or bone pain.  She is currently on.  Femara tolerating that well without side effect.  Her last mammogram was back in March was BI-RADS 2 benign which I have reviewed.  She is also been dealing with back pain has had intermittent instrumentation in the past.  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  She is considering options at this time including injections as well as surgery.  COMPLICATIONS OF TREATMENT: none  FOLLOW UP COMPLIANCE: keeps appointments   PHYSICAL EXAM:  BP 132/77   Pulse 82   Temp 97.6 F (36.4 C)   Wt 256 lb 14.4 oz (116.5 kg)   LMP 04/09/2005 Comment: Tubal Ligation  BMI 43.42 kg/m  Lungs are clear to A&P cardiac examination essentially unremarkable with regular rate and rhythm. No dominant mass or nodularity is noted in either breast in 2 positions examined. Incision is well-healed. No axillary or supraclavicular adenopathy is appreciated. Cosmetic result is excellent.  Well-developed well-nourished patient in NAD. HEENT reveals PERLA, EOMI, discs not visualized.  Oral cavity is clear. No oral mucosal lesions are identified. Neck is clear without evidence of cervical or supraclavicular adenopathy. Lungs are clear to A&P. Cardiac  examination is essentially unremarkable with regular rate and rhythm without murmur rub or thrill. Abdomen is benign with no organomegaly or masses noted. Motor sensory and DTR levels are equal and symmetric in the upper and lower extremities. Cranial nerves II through XII are grossly intact. Proprioception is intact. No peripheral adenopathy or edema is identified. No motor or sensory levels are noted. Crude visual fields are within normal range.  RADIOLOGY RESULTS: Mammograms reviewed compatible with above-stated findings.  MRI scan of lumbar spine reviewed compatible with above-stated findings  PLAN: Present time she continues to do well from a breast standpoint with no evidence of disease.  I am pleased with her overall progress.  She will be making decisions about her lumbar spine I see no evidence of malignancy in that area on my review.  I have asked to see her back in 1 year for follow-up.  She continues on Femara without side effect.  Patient knows to call with any concerns.  I would like to take this opportunity to thank you for allowing me to participate in the care of your patient..Noreene Filbert MD

## 2019-03-09 ENCOUNTER — Other Ambulatory Visit: Payer: Self-pay | Admitting: Oncology

## 2019-03-09 DIAGNOSIS — C50411 Malignant neoplasm of upper-outer quadrant of right female breast: Secondary | ICD-10-CM

## 2019-03-11 NOTE — Progress Notes (Signed)
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  INTERVAL HISTORY: Patient returns to clinic today for routine 23-monthevaluation.  She continues to tolerate letrozole well only with some occasional hot flashes that do not affect her day-to-day activity.  She is worried about her weight gain in the interim, but otherwise feels well.  She continues to be anxious. She has no neurologic complaints.  She does not complain of weakness or fatigue today.  She has a good appetite and denies weight loss.  She denies any chest pain, shortness of breath, cough, or hemoptysis.  She denies any nausea, vomiting, constipation, or diarrhea. She has no urinary complaints.  Patient offers no further specific complaints today.  REVIEW OF SYSTEMS:   Review of Systems  Constitutional: Negative.  Negative for fever, malaise/fatigue and weight loss.  Respiratory: Negative.  Negative for cough and shortness of breath.   Cardiovascular: Negative.  Negative for chest pain and leg swelling.  Gastrointestinal: Negative.  Negative for abdominal pain.  Genitourinary: Negative.  Negative for dysuria.  Musculoskeletal: Positive for back pain. Negative for joint pain.  Skin: Negative.  Negative for itching and rash.  Neurological: Positive for sensory change. Negative for tingling, focal weakness and weakness.  Psychiatric/Behavioral:  The patient is nervous/anxious. The patient does not have insomnia.     As per HPI. Otherwise, a complete review of systems is negative.  PAST MEDICAL HISTORY: Past Medical History:  Diagnosis Date  . Anemia   . Arthritis   . Breast cancer ([place] 2018   Right breast cancer  . Dyspnea    SINCE STARTING CHEMO WITH EXERTION  . GERD (gastroesophageal reflux disease)    NO MEDS  . Heart murmur    DX IN TEENS-ASYMPTOMATIC  . Hypertension   . Migraines    H/O MIGRAINES  . Personal history of chemotherapy 2018   finished 09/08/17  . Personal history of radiation therapy    Rt. breast  . Thumb fracture    LEFT-WEARING BRACE    PAST SURGICAL HISTORY: Past Surgical History:  Procedure Laterality Date  . ABLATION  2006  . BREAST BIOPSY Right 03/19/2017    IMC and mets in lymph node  . BREAST LUMPECTOMY Right 10/14/2017  . CESAREAN SECTION    . FRACTURE SURGERY Left 2013   left hand, thumb (screws and titanium)  . INCISION AND DRAINAGE ABSCESS Right 04/26/2018   Procedure: INCISION AND DRAINAGE ABSCESS;  Surgeon: CHerbert Pun MD;  Location: [facility] ORS;  Service: General;  Laterality: Right;  . PARTIAL MASTECTOMY WITH NEEDLE LOCALIZATION Right 10/14/2017   Procedure: PARTIAL MASTECTOMY WITH NEEDLE LOCALIZATION;  Surgeon: CHerbert Pun MD;  Location: [facility] ORS;  Service: General;  Laterality: Right;  . PILONIDAL CYST EXCISION  1980'S  . PORT-A-CATH REMOVAL N/A 04/27/2017   Procedure: REMOVAL OF PORT A CATH & insertion PORT A CATH LEFT INTERNAL JUGULAR;  Surgeon: CHerbert Pun MD;  Location: [facility] ORS;  Service: General;  Laterality: N/A;  . PORTACATH PLACEMENT N/A 04/15/2017  Procedure: INSERTION PORT-A-CATH;  Surgeon: Herbert Pun, MD;  Location: [facility] ORS;  Service: General;  Laterality: N/A;  . SENTINEL NODE BIOPSY Right 10/14/2017   Procedure: SENTINEL NODE BIOPSY;  Surgeon: Herbert Pun, MD;  Location: [facility] OR[facility];  Service: General;  Laterality:  Right;    FAMILY HISTORY: Family History  Problem Relation Age of Onset  . Breast cancer Maternal Grandmother   . Hypertension Maternal Grandmother   . Angina Mother   . Lung cancer Father   . Diabetes Father   . Prostate cancer Maternal Uncle   . Dementia Maternal Grandfather   . Congestive Heart Failure Maternal Grandfather   . Heart disease Paternal Grandmother     ADVANCED DIRECTIVES (Y/N):  N  HEALTH MAINTENANCE: Social History   Tobacco Use  . Smoking status: Former Smoker    Packs/day: 2.00    Years: 18.00    Pack years: 36.00    Types: Cigarettes    Quit date: 05/01/2017    Years since quitting: 1.8  . Smokeless tobacco: Never Used  . Tobacco comment: quit 4 months ago  Substance Use Topics  . Alcohol use: No  . Drug use: No     Colonoscopy:  PAP:  Bone density:  Lipid panel:  Allergies  Allergen Reactions  . Hydrocodone-Acetaminophen Itching and Rash  . Penicillins Swelling, Other (See Comments) and Rash    Throat swelling Has patient had a PCN reaction causing immediate rash, facial/tongue/throat swelling, SOB or lightheadedness with hypotension: Yes Has patient had a PCN reaction causing severe rash involving mucus membranes or skin necrosis: No Has patient had a PCN reaction that required hospitalization: No Has patient had a PCN reaction occurring within the last 10 years: No If all of the above answers are "NO", then may proceed with Cephalosporin use.     Current Outpatient Medications  Medication Sig Dispense Refill  . acetaminophen (TYLENOL) 500 MG tablet Take 1,000 mg by mouth every 8 (eight) hours as needed for mild pain.    Marland Kitchen ALPRAZolam (XANAX) 0.25 MG tablet Take 1 tablet (0.25 mg total) by mouth at bedtime as needed for anxiety. 30 tablet 1  . bisoprolol-hydrochlorothiazide (ZIAC) 5-6.25 MG tablet Take 1 tablet by mouth daily.     . Calcium-Magnesium-Zinc (CAL-MAG-ZINC PO) Take 1 tablet by mouth daily.    . citalopram (CELEXA) 10 MG  tablet Take 10 mg by mouth daily.    . diclofenac sodium (VOLTAREN) 1 % GEL Apply 2 g topically 4 (four) times daily as needed (for pain in right thumb).    . fluticasone (FLONASE) 50 MCG/ACT nasal spray SPRAY 2 SPRAYS INTO EACH NOSTRIL EVERY DAY  2  . hydrochlorothiazide (HYDRODIURIL) 12.5 MG tablet Take by mouth.    Marland Kitchen ibuprofen (ADVIL,MOTRIN) 600 MG tablet   0  . letrozole (FEMARA) 2.5 MG tablet TAKE 1 TABLET BY MOUTH EVERY DAY 90 tablet 3  . buPROPion (WELLBUTRIN SR) 200 MG 12 hr tablet Take 200 mg by mouth 2 (two) times daily as needed.     . gabapentin (NEURONTIN) 300 MG capsule TAKE 1 CAPSULE BY MOUTH THREE TIMES A DAY (Patient not taking: Reported on 03/15/2019) 270 capsule 1  . lidocaine-prilocaine (EMLA) cream Apply 1 application topically as needed. 30 g 3  . loratadine (CLARITIN) 10 MG tablet Take 10 mg by mouth daily as needed for allergies.    . Multiple Vitamins-Minerals (ADULT GUMMY PO) Take 2 tablets by mouth daily. One-A-Day Women's Multivitamin Gummies    . nystatin (  MYCOSTATIN/NYSTOP) powder Apply topically 3 (three) times daily. (Patient not taking: Reported on 03/15/2019) 15 g 0  . silver sulfADIAZINE (SILVADENE) 1 % cream Apply 1 application topically 2 (two) times daily. (Patient not taking: Reported on 03/15/2019) 50 g 3  . traMADol (ULTRAM) 50 MG tablet Take 1 tablet (50 mg total) by mouth every 6 (six) hours as needed (for pain.). (Patient not taking: Reported on 03/15/2019) 30 tablet 0  . vitamin C (ASCORBIC ACID) 500 MG tablet Take 500 mg by mouth daily.     No current facility-administered medications for this visit.     OBJECTIVE: Vitals:   03/15/19 1117  BP: 134/85  Pulse: 85  Resp: 18  Temp: (!) 97.1 F (36.2 C)     Body mass index is 43.89 kg/m.    ECOG FS:0 - Asymptomatic  General: Well-developed, well-nourished, no acute distress. Eyes: Pink conjunctiva, anicteric sclera. HEENT: Normocephalic, moist mucous membranes. Breast: Bilateral breast and axilla  without lumps or masses. Lungs: Clear to auscultation bilaterally. Heart: Regular rate and rhythm. No rubs, murmurs, or gallops. Abdomen: Soft, nontender, nondistended. No organomegaly noted, normoactive bowel sounds. Musculoskeletal: No edema, cyanosis, or clubbing. Neuro: Alert, answering all questions appropriately. Cranial nerves grossly intact. Skin: No rashes or petechiae noted. Psych: Normal affect.  LAB RESULTS:  Lab Results  Component Value Date   NA 139 12/30/2017   K 3.7 12/30/2017   CL 106 12/30/2017   CO2 26 12/30/2017   GLUCOSE 106 (H) 12/30/2017   BUN 11 12/30/2017   CREATININE 0.68 12/30/2017   CALCIUM 9.1 12/30/2017   PROT 6.7 12/30/2017   ALBUMIN 3.7 12/30/2017   AST 18 12/30/2017   ALT 13 (L) 12/30/2017   ALKPHOS 106 12/30/2017   BILITOT 0.3 12/30/2017   GFRNONAA >60 12/30/2017   GFRAA >60 12/30/2017    Lab Results  Component Value Date   WBC 6.9 12/30/2017   NEUTROABS 4.6 12/10/2017   HGB 11.8 (L) 12/30/2017   HCT 35.2 12/30/2017   MCV 70.3 (L) 12/30/2017   PLT 235 12/30/2017     STUDIES: No results found.  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  PLAN:    1. 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  Patient completed neoadjuvant chemotherapy with Adriamycin, Cytoxan, and Taxol on September 08, 2017.  She then underwent partial mastectomy on October 14, 2017.  Patient noted to have residual disease, therefore proceeded with adjuvant XRT which she completed on January 06, 2018.  Continue letrozole for total 5 years completing in June 2024.  Patient's most recent mammogram on September 21, 2018 was reported as BI-RADS 2.  Repeat in March 2021.  Return to clinic in 6 months for routine evaluation.   2.  Hot flashes:  Chronic and relatively unchanged.  Continue letrozole as prescribed. 3.  Osteopenia: Patient's most recent bone mineral density on February 04, 2018 reported T score of -1.3.  Continue calcium and vitamin D supplementations.  Repeat in the next 1 to 2 weeks.   4.  Insomnia/anxiety: Chronic and unchanged.  Continue Xanax as needed. 5.  Neuropathy: Patient does not complain of this today. 6.  Back pain: Possibly secondary to weight gain.  Continue monitoring and evaluation per orthopedics. 7  Clinical trials enrolled:  [id number] - BWEL: Randomized Phase  III Trial Evaluating the Role of Weight Loss in Adjuvant Treatment of Overweight and Obese Women with Early Breast Cancer  [id number]: A randomized phase III double blinded placebo-controlled trial of aspirin as adjuvant therapy for HER2 negative breast cancer: the ABC trial  I spent a total of 30 minutes face-to-face with the patient of which greater than 50% of the visit was spent in counseling and coordination of care as detailed above.   Patient expressed understanding and was in agreement with this plan. She also understands that She can call clinic at any time with any questions, concerns, or complaints.   Cancer Staging Primary cancer of upper outer quadrant of right female breast [facility]) Staging form: Breast, AJCC 8th Edition - Clinical stage from 03/27/2017: Stage IB (cT1c, cN1, cM0, G2, ER: Positive, PR: Positive, HER2: Negative) - Signed by Lloyd Huger, MD on 03/27/2017 - Pathologic stage from 10/22/2017: No Stage Recommended (ypT1c, pN1a, cM0, G2, ER+, PR+, HER2-) - Signed by Lloyd Huger, MD on 10/22/2017   Lloyd Huger, MD   03/16/2019 6:19 AM

## 2019-03-14 ENCOUNTER — Other Ambulatory Visit: Payer: Self-pay

## 2019-03-15 ENCOUNTER — Other Ambulatory Visit: Payer: Self-pay

## 2019-03-15 ENCOUNTER — Encounter: Payer: Self-pay | Admitting: Oncology

## 2019-03-15 ENCOUNTER — Inpatient Hospital Stay: Payer: Medicaid Other | Attending: Oncology | Admitting: Oncology

## 2019-03-15 VITALS — BP 134/85 | HR 85 | Temp 97.1°F | Resp 18 | Wt 259.7 lb

## 2019-03-15 DIAGNOSIS — Z79811 Long term (current) use of aromatase inhibitors: Secondary | ICD-10-CM | POA: Diagnosis not present

## 2019-03-15 DIAGNOSIS — C50411 Malignant neoplasm of upper-outer quadrant of right female breast: Secondary | ICD-10-CM | POA: Diagnosis not present

## 2019-03-15 DIAGNOSIS — G629 Polyneuropathy, unspecified: Secondary | ICD-10-CM | POA: Diagnosis not present

## 2019-03-15 DIAGNOSIS — M8580 Other specified disorders of bone density and structure, unspecified site: Secondary | ICD-10-CM | POA: Diagnosis not present

## 2019-03-15 DIAGNOSIS — M549 Dorsalgia, unspecified: Secondary | ICD-10-CM | POA: Insufficient documentation

## 2019-03-15 DIAGNOSIS — N951 Menopausal and female climacteric states: Secondary | ICD-10-CM | POA: Insufficient documentation

## 2019-03-15 DIAGNOSIS — Z170 Estrogen receptor positive status [ER+]: Secondary | ICD-10-CM | POA: Insufficient documentation

## 2019-03-15 MED ORDER — LIDOCAINE-PRILOCAINE 2.5-2.5 % EX CREA: 1.0000 "application " | TOPICAL_CREAM | CUTANEOUS | 3 refills | Status: DC | PRN

## 2019-03-15 NOTE — Progress Notes (Signed)
Pt in for follow up, has multiple concerns regarding recent other MD visits, MRI and medication refills.

## 2019-03-24 ENCOUNTER — Ambulatory Visit
Admission: RE | Admit: 2019-03-24 | Discharge: 2019-03-24 | Disposition: A | Discharge status: Home / Self Care | Payer: Medicaid Other | Source: Ambulatory Visit | Attending: Oncology | Admitting: Oncology

## 2019-03-24 DIAGNOSIS — C50411 Malignant neoplasm of upper-outer quadrant of right female breast: Secondary | ICD-10-CM | POA: Diagnosis present

## 2019-04-05 ENCOUNTER — Inpatient Hospital Stay: Payer: Medicaid Other

## 2019-04-05 ENCOUNTER — Other Ambulatory Visit: Payer: Self-pay

## 2019-04-05 ENCOUNTER — Inpatient Hospital Stay: Payer: Medicaid Other | Attending: Oncology

## 2019-04-05 DIAGNOSIS — Z452 Encounter for adjustment and management of vascular access device: Secondary | ICD-10-CM | POA: Insufficient documentation

## 2019-04-05 DIAGNOSIS — C50411 Malignant neoplasm of upper-outer quadrant of right female breast: Secondary | ICD-10-CM | POA: Insufficient documentation

## 2019-04-05 DIAGNOSIS — Z95828 Presence of other vascular implants and grafts: Secondary | ICD-10-CM

## 2019-04-05 MED ORDER — SODIUM CHLORIDE 0.9% FLUSH
10.0000 mL | Freq: Once | INTRAVENOUS | Status: AC
  Administered 2019-04-05: 14:00:00 10 mL via INTRAVENOUS
  Filled 2019-04-05: qty 10

## 2019-04-05 MED ORDER — HEPARIN SOD (PORK) LOCK FLUSH 100 UNIT/ML IV SOLN
500.0000 [IU] | Freq: Once | INTRAVENOUS | Status: AC
  Administered 2019-04-05: 500 [IU] via INTRAVENOUS

## 2019-04-28 ENCOUNTER — Encounter: Payer: Self-pay | Admitting: *Deleted

## 2019-04-28 DIAGNOSIS — C50411 Malignant neoplasm of upper-outer quadrant of right female breast: Secondary | ICD-10-CM

## 2019-04-28 NOTE — Research (Signed)
T/C to Rebb[facility] Bristow this afternoon to conduct missed solicited 12 month AE assessment for the [id number] BWEL research study. Patient was seen in clinic by Dr. Grayland Ormond as scheduled on 03/15/2019; however the research portion of her visit was inadvertently missed. Ms. Redrick agreed to return to clinic on 05/03/19 to complete the missed assessments for this time point. Patient reports she is frustrated by her continued weight gain and feels she needs to get more serious in her efforts to lose weight, but has not been able to be as active due to suffering from back pain and numbness in her feet and legs a few months ago. States this is beginning to resolve, but still suffers about 2/7 days with back pain that keeps her in the bed.. Reviewed 12 month solicited AE form with patient by phone as documented. Other AEs remain unchanged since last assessed, only the back pain and neuropathy are new. Solicited and other AE's with grade and attribution were assessed as noted below:  Study/Protocol: [id number] BWEL Cycle: 12 month Event Grade Onset Date Resolved Date Drug Name Aspirin/placebo Attribution Treatment Comments  Fractures 0       Solicited AE  Sprains 0      Solicited AE  Tendon or ligament injury 0        Solicited AE  Orthopedic surgery 0        Solicited AE    *OTHER AE's*           Pedal edema 1 unknown   unrelated  none States this has worsened  Hot flashes 1 unknown   Letrozole  none   Arthralgias 1 unknown   Letrozole  none Pain in hands  Bruising 1 unknown   unrelated  none Reports hx of bruising easily  Back pain 2 01/05/2019   unrelated    Neuropathy in BLE 1 01/05/2019   unrelated gabapentin   Yolande Jolly, BSN, MHA, OCN 04/28/2019 3:54 PM   Starleen Blue returned to clinic this morning as scheduled to complete her 12 month time point activities for the [employer] [id number] BWEL study. Ms. Schwebel completed [id number] month BWEL study questionnaire while in clinic. Patient's  weight and waist & hip measurements were checked twice per protocol with patient removing shoes and wearing light clothing. Weight was 116.53kg and 116.57kg after having patient step off scales, then resetting for second weight check. Waist measured 107.5cm and 108cm respectively at narrowest section between the lowest rib and the iliac crest and hips measured 139.5cm x 2 at maximum circumference while patient standing straight up with feet approximately 10cm apart. Solicited and other AE's with grade and attribution were assessed by phone on 04/28/2019 as noted above. Patient again voices discouragement at having gained weight again and states it has been very difficult for her to get out of the house since COVID-19 restrictions were put in place several months ago, but states she is still receiving her BWEL weight loss study materials. Yolande Jolly, BSN, MHA, OCN 05/03/2019 12:00 PM

## 2019-04-28 NOTE — Research (Signed)
T/C to Destiny Rogers this afternoon to conduct missed solicited 12 month AE assessment for the [id number] [facility] research study. Patient was seen in clinic by Dr. Grayland Ormond as scheduled on 03/15/2019; however the research nurse portion of her visit was inadvertently missed due to this RN being on vacation when patient was in clinic. Dr. Grayland Ormond completed the H&P and other AE assessment, and patient VS, PS and weight were assessed. She reports overall compliance with her study drug, but states she ran out of the study drug a few weeks ago, but neglected to call and let me know. She denies taking any aspirin containing medications and states she only takes ES Tylenol for her pain. Instructed patient that we need to get her into the clinic as soon as possible to dispense new study drug and exchange her medication calendars. Destiny Rogers agreed to come in on 05/03/2019 to pick up her study drug.  Study/Protocol: [id number] - ABC  Cycle: 12 month Event Grade Onset Date Resolved Date Drug Name Aspirin/placebo Attribution Treatment Comments  Gastrointestinal Bleeding 0        Intracranial Hemorrhage 0        Epistaxis 0        Hematuria 0        Dyspepsia 1 04/21/2017 09/07/2018    unrelated  none Denies at present  Gastritis 0        Bruising 1 reported 09/07/2018     reports hx of bruising easily  Pedal edema 1 unknown   unrelated  none States this has worsened  Hot flashes 1 unknown   Letrozole    Arthralgias 1 unknown   Letrozole  Pain in hands  Back pain 2 01/05/2019   unrelated    Neuropathy in BLE 1 01/05/2019   unrelated gabapentin   Yolande Jolly, BSN, [place], OCN 04/28/2019 3:52 PM  Clinic Visit 05/03/2019: Destiny Rogers presented to clinic this morning as scheduled to return her empty pill bottle and completed medication diaries, and pick up her new bottle of aspirin/placebo and calendars for the [id number] ABC study. Patient reports she ran out of study drug on 03/26/2019  and has had none to take since then. Questioned why she did not notify me to get more study drug and Destiny Rogers reports she had lost my phone number. Medication diaries and pill count of "0" indicate patient was 100% compliant while she had study drug available. The fact that she has missed the past 37 days will bring her compliance rate down for the next reporting period; however. Solicited and other adverse events were assessed by phone last week. Patient was given new medication calendars for the next 6 months and a new bottle of aspirin/placebo 300mg  tabs, #200 labeled with correct patient study ID number [id number] was dispensed to patient. Correct patient ID number and dose were verified with pharmacist [facility]. Patient was reminded to take one tablet daily with food and mark the time on her new calendars beginning today. My contact information was written on the medication calendar and patient was also given my business card to keep handy. Instructed her to call me with any questions and especially prior to running out of study drug. Destiny Rogers agreed with this plan. Yolande Jolly, BSN, MHA, OCN 05/03/2019 12:10 PM

## 2019-05-13 IMAGING — MG MM DIGITAL DIAGNOSTIC BILAT W/ TOMO W/ CAD
8 of 15 series · 8 of 35 positions shown · non-contrast
Comparison: Previous exam(s).

CLINICAL DATA: Right breast upper outer quadrant area of palpable
concern felt clinically.

EXAM:
2D DIGITAL DIAGNOSTIC BILATERAL MAMMOGRAM WITH CAD AND ADJUNCT TOMO
ULTRASOUND RIGHT BREAST

[L MLO synth-2D]
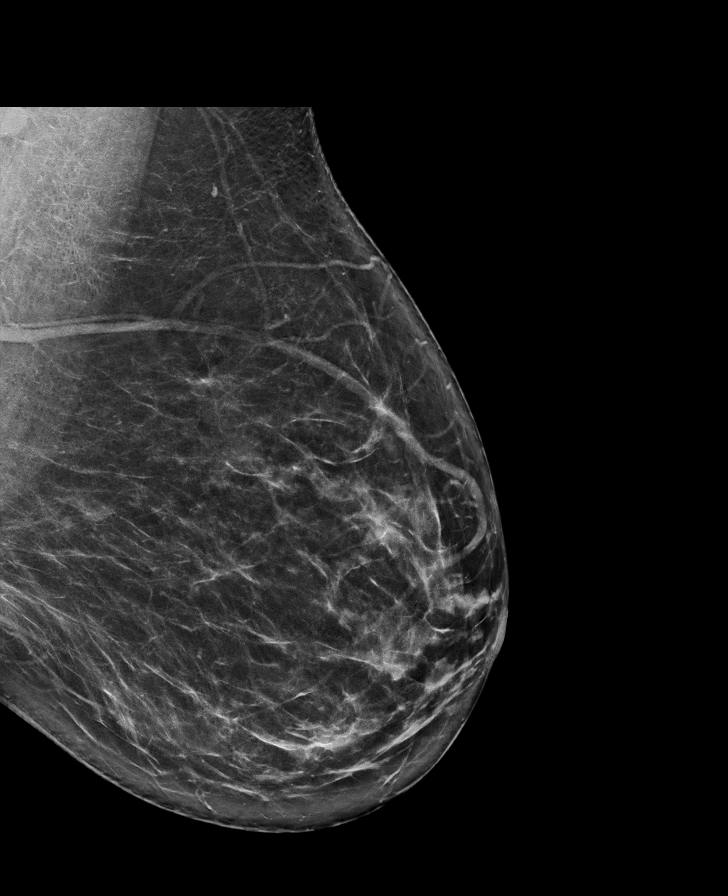

[R CC synth-2D]
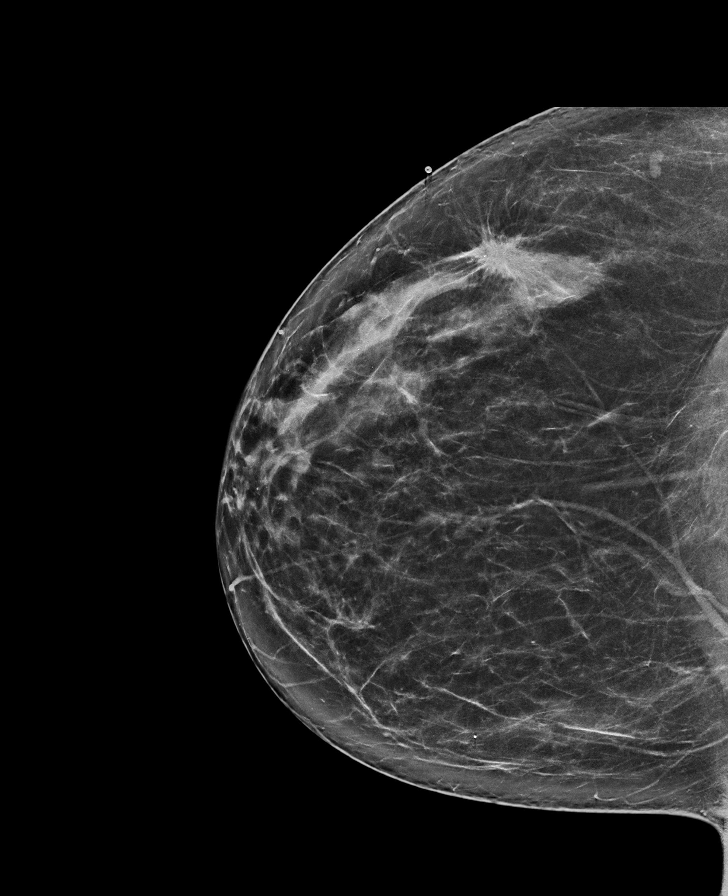

[R TAN]
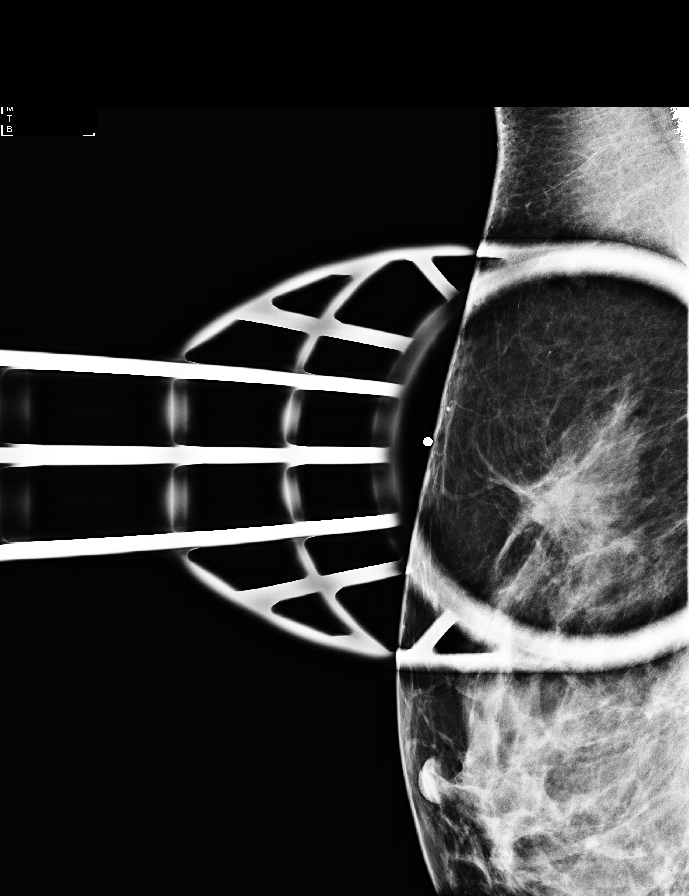

[R TAN synth-2D]
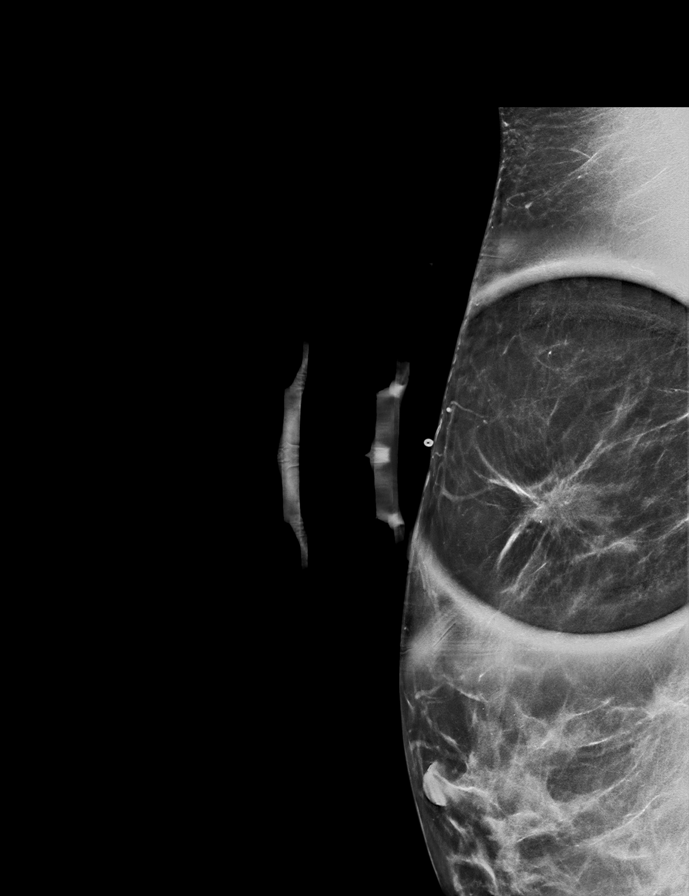

[R CC]
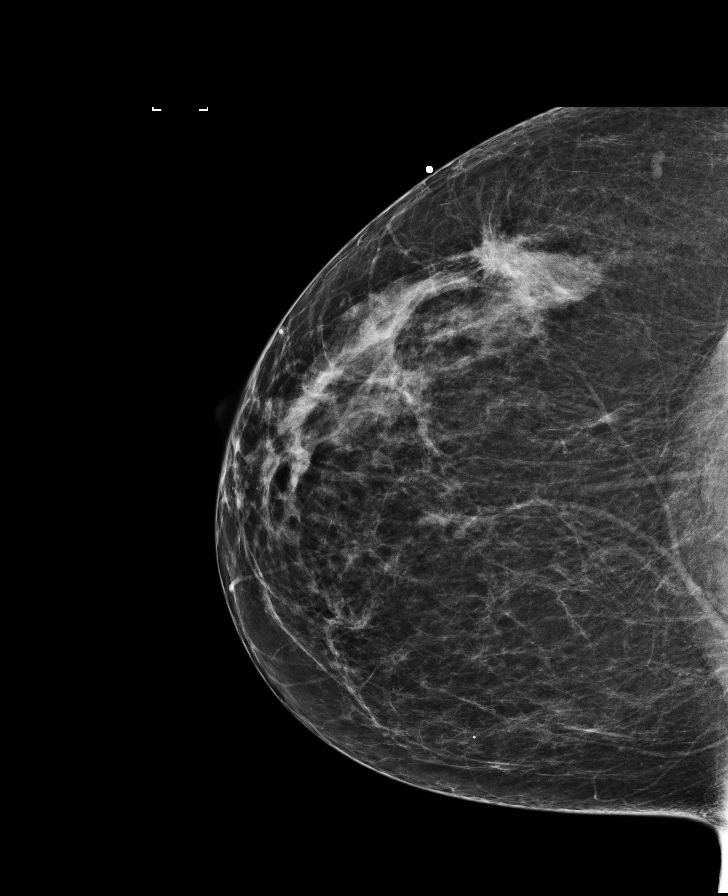

[R MLO]
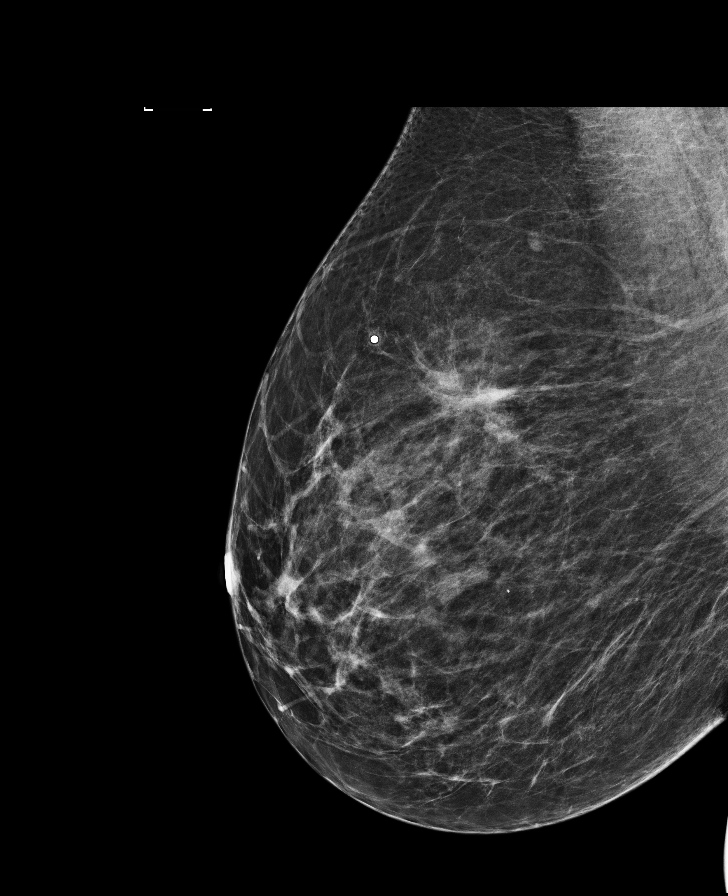

[R MLO synth-2D]
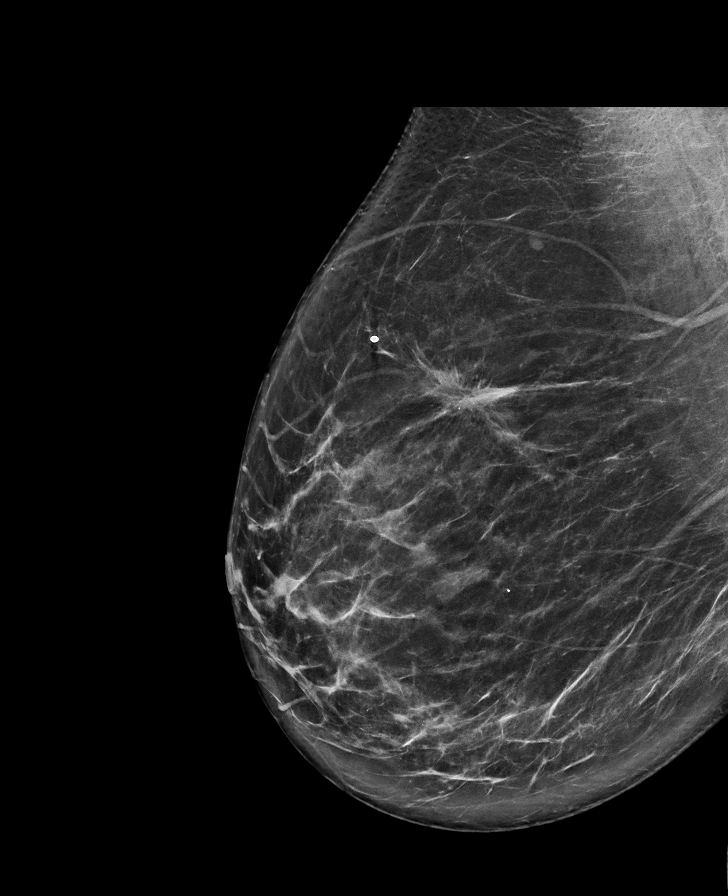

[L MLO]
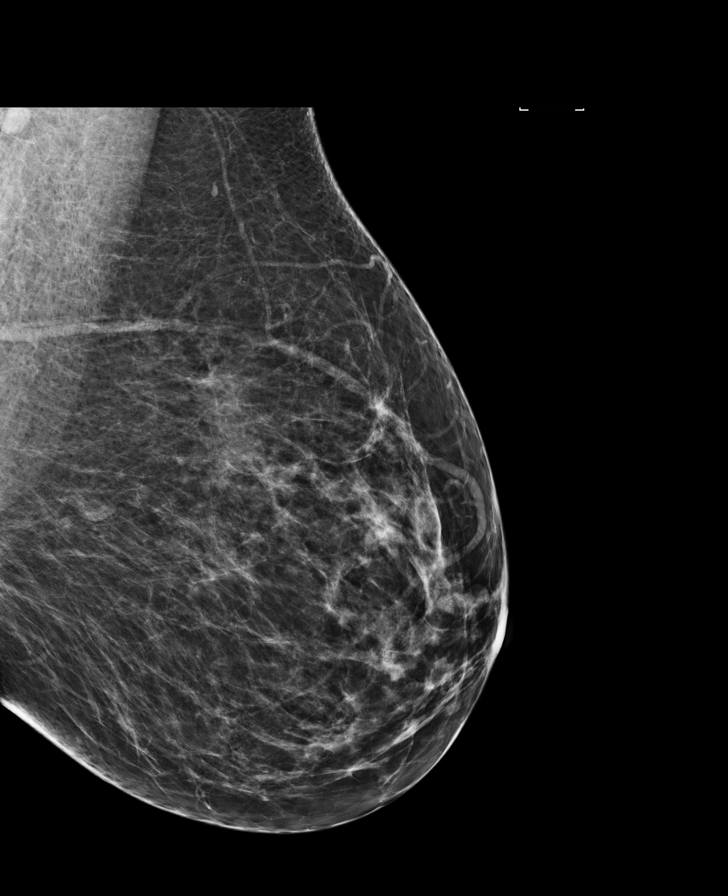

[8 of 35 positions shown; findings below may reference images not displayed]

ACR Breast Density Category c: The breast tissue is heterogeneously
dense, which may obscure small masses.
FINDINGS: Mammographically, there are no suspicious masses, areas of
architectural distortion or microcalcifications in the left breast.
There is an ill-defined spiculated mass in the right breast upper
outer quadrant, posterior depth, which corresponds to the area of
palpable concern.

Mammographic images were processed with CAD.

On physical exam, palpable moderately firm approximately 1.5 cm
nodule in the right breast upper outer quadrant, posterior depth.

Targeted ultrasound is performed, showing right breast 930 o'clock 6
cm from the nipple hypoechoic irregular mass with associated
hypervascularity and posterior acoustic shadowing. It measures 1.4 x
1.1 x 0.7 cm. This finding corresponds to the mammographically seen
mass. There are 2 lymph nodes in the right axilla with mildly
increased cortical thickening measuring 3 mm.
IMPRESSION: Suspicious right breast 930 o'clock mass.

Two indeterminate right axillary lymph nodes.

RECOMMENDATION:
Ultrasound-guided core needle biopsy of right breast 930 o'clock
mass and 1 of the right axillary lymph nodes with thickened cortex.

I have discussed the findings and recommendations with the patient.
Results were also provided in writing at the conclusion of the
visit. If applicable, a reminder letter will be sent to the patient
regarding the next appointment.

BI-RADS CATEGORY  4: Suspicious.

## 2019-05-30 ENCOUNTER — Other Ambulatory Visit: Payer: Self-pay

## 2019-05-31 ENCOUNTER — Other Ambulatory Visit: Payer: Self-pay

## 2019-05-31 ENCOUNTER — Inpatient Hospital Stay: Payer: Medicaid Other | Attending: Oncology

## 2019-05-31 ENCOUNTER — Inpatient Hospital Stay: Payer: Medicaid Other

## 2019-05-31 DIAGNOSIS — Z95828 Presence of other vascular implants and grafts: Secondary | ICD-10-CM

## 2019-05-31 DIAGNOSIS — C50411 Malignant neoplasm of upper-outer quadrant of right female breast: Secondary | ICD-10-CM | POA: Diagnosis present

## 2019-05-31 DIAGNOSIS — Z452 Encounter for adjustment and management of vascular access device: Secondary | ICD-10-CM | POA: Insufficient documentation

## 2019-05-31 MED ORDER — SODIUM CHLORIDE 0.9% FLUSH
10.0000 mL | Freq: Once | INTRAVENOUS | Status: AC
  Administered 2019-05-31: 10 mL via INTRAVENOUS
  Filled 2019-05-31: qty 10

## 2019-05-31 MED ORDER — HEPARIN SOD (PORK) LOCK FLUSH 100 UNIT/ML IV SOLN
500.0000 [IU] | Freq: Once | INTRAVENOUS | Status: AC
  Administered 2019-05-31: 500 [IU] via INTRAVENOUS

## 2019-07-26 ENCOUNTER — Inpatient Hospital Stay: Payer: Medicaid Other | Attending: Oncology

## 2019-07-26 ENCOUNTER — Other Ambulatory Visit: Payer: Self-pay

## 2019-07-26 ENCOUNTER — Inpatient Hospital Stay: Payer: Medicaid Other

## 2019-07-26 VITALS — BP 129/90 | HR 78 | Temp 98.0°F | Resp 18

## 2019-07-26 DIAGNOSIS — C50411 Malignant neoplasm of upper-outer quadrant of right female breast: Secondary | ICD-10-CM | POA: Diagnosis not present

## 2019-07-26 DIAGNOSIS — Z23 Encounter for immunization: Secondary | ICD-10-CM

## 2019-07-26 DIAGNOSIS — Z452 Encounter for adjustment and management of vascular access device: Secondary | ICD-10-CM | POA: Diagnosis not present

## 2019-07-26 DIAGNOSIS — Z95828 Presence of other vascular implants and grafts: Secondary | ICD-10-CM

## 2019-07-26 MED ORDER — SODIUM CHLORIDE 0.9% FLUSH
10.0000 mL | INTRAVENOUS | Status: DC | PRN
  Administered 2019-07-26: 14:00:00 10 mL via INTRAVENOUS
  Filled 2019-07-26: qty 10

## 2019-07-26 MED ORDER — HEPARIN SOD (PORK) LOCK FLUSH 100 UNIT/ML IV SOLN
500.0000 [IU] | Freq: Once | INTRAVENOUS | Status: AC
  Administered 2019-07-26: 14:00:00 500 [IU] via INTRAVENOUS
  Filled 2019-07-26: qty 5

## 2019-07-26 MED ORDER — INFLUENZA VAC SPLIT QUAD 0.5 ML IM SUSY
0.5000 mL | PREFILLED_SYRINGE | Freq: Once | INTRAMUSCULAR | Status: AC
  Administered 2019-07-26: 14:00:00 0.5 mL via INTRAMUSCULAR
  Filled 2019-07-26: qty 0.5

## 2019-08-24 ENCOUNTER — Other Ambulatory Visit: Payer: Self-pay | Admitting: Oncology

## 2019-09-09 NOTE — Progress Notes (Signed)
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  INTERVAL HISTORY: Patient returns to clinic today for routine 53-monthevaluation.  She is tolerating letrozole well only with occasional hot flashes that did not affect her day-to-day activity. She has no neurologic complaints.  She does not complain of weakness or fatigue today.  She has a good appetite and denies weight loss.  She denies any chest pain, shortness of breath, cough, or hemoptysis.  She denies any nausea, vomiting, constipation, or diarrhea. She has no urinary complaints.  Patient offers no further specific complaints today.  REVIEW OF SYSTEMS:   Review of Systems  Constitutional: Negative.  Negative for fever, malaise/fatigue and weight loss.  Respiratory: Negative.  Negative for cough and shortness of breath.   Cardiovascular: Negative.  Negative for chest pain and leg swelling.  Gastrointestinal: Negative.  Negative for abdominal pain.  Genitourinary: Negative.  Negative for dysuria.  Musculoskeletal: Positive for back pain. Negative for joint pain.  Skin: Negative.  Negative for itching and rash.  Neurological: Positive for sensory change. Negative for tingling, focal weakness and weakness.  Psychiatric/Behavioral: The patient is nervous/anxious. The patient does not have insomnia.     As per HPI. Otherwise, a complete review of  systems is negative.  PAST MEDICAL HISTORY: Past Medical History:  Diagnosis Date  . Anemia   . Arthritis   . [place]   [place] breast cancer  . Dyspnea    SINCE STARTING CHEMO WITH EXERTION  . GERD (gastroesophageal reflux disease)    NO MEDS  . Heart murmur    DX IN TEENS-ASYMPTOMATIC  . Hypertension   . Migraines    H/O MIGRAINES  . Personal history of chemotherapy 2018   finished 09/08/17  . Personal history of radiation therapy    Rt. breast  . Thumb fracture    LEFT-WEARING BRACE    PAST SURGICAL HISTORY: Past Surgical History:  Procedure Laterality Date  . ABLATION  2006  . BREAST BIOPSY Right 03/19/2017    IMC and mets in lymph node  . BREAST LUMPECTOMY Right 10/14/2017  . CESAREAN SECTION    . FRACTURE SURGERY Left 2013   left hand, thumb (screws and titanium)  . INCISION AND DRAINAGE ABSCESS Right 04/26/2018   Procedure: INCISION AND DRAINAGE ABSCESS;  Surgeon: CHerbert Pun MD;  Location: [facility] ORS;  Service: General;  Laterality: Right;  . PARTIAL MASTECTOMY WITH NEEDLE LOCALIZATION Right 10/14/2017   Procedure: PARTIAL MASTECTOMY WITH NEEDLE LOCALIZATION;  Surgeon: CHerbert Pun MD;  Location: [facility] ORS;  Service: General;  Laterality: Right;  . PILONIDAL CYST EXCISION  1980'S  . PORT-A-CATH REMOVAL N/A 04/27/2017   Procedure: REMOVAL OF PORT A CATH & insertion PORT A CATH LEFT INTERNAL JUGULAR;  Surgeon: CHerbert Pun MD;  Location: [facility] ORS;  Service: General;  Laterality: N/A;  . PORTACATH PLACEMENT N/A 04/15/2017   Procedure: INSERTION PORT-A-CATH;  Surgeon: CHerbert Pun MD;  Location: [facility] ORS;  Service: General;  Laterality: N/A;  . SENTINEL  NODE BIOPSY Right 10/14/2017   Procedure: SENTINEL NODE BIOPSY;  Surgeon: Herbert Pun, MD;  Location: [facility] ORS;  Service: General;  Laterality: Right;    FAMILY HISTORY: Family History  Problem Relation Age of Onset  . Breast cancer Maternal Grandmother     . Hypertension Maternal Grandmother   . Angina Mother   . Lung cancer Father   . Diabetes Father   . Prostate cancer Maternal Uncle   . Dementia Maternal Grandfather   . Congestive Heart Failure Maternal Grandfather   . Heart disease Paternal Grandmother     ADVANCED DIRECTIVES (Y/N):  N  HEALTH MAINTENANCE: Social History   Tobacco Use  . Smoking status: Former Smoker    Packs/day: 2.00    Years: 18.00    Pack years: 36.00    Types: Cigarettes    Quit date: 05/01/2017    Years since quitting: 2.3  . Smokeless tobacco: Never Used  . Tobacco comment: quit 4 months ago  Substance Use Topics  . Alcohol use: No  . Drug use: No     Colonoscopy:  PAP:  Bone density:  Lipid panel:  Allergies  Allergen Reactions  . Hydrocodone-Acetaminophen Itching and Rash  . Penicillins Swelling, Other (See Comments) and Rash    Throat swelling Has patient had a PCN reaction causing immediate rash, facial/tongue/throat swelling, SOB or lightheadedness with hypotension: Yes Has patient had a PCN reaction causing severe rash involving mucus membranes or skin necrosis: No Has patient had a PCN reaction that required hospitalization: No Has patient had a PCN reaction occurring within the last 10 years: No If all of the above answers are "NO", then may proceed with Cephalosporin use.     Current Outpatient Medications  Medication Sig Dispense Refill  . acetaminophen (TYLENOL) 500 MG tablet Take 1,000 mg by mouth every 8 (eight) hours as needed for mild pain.    Marland Kitchen ALPRAZolam (XANAX) 0.25 MG tablet Take 1 tablet (0.25 mg total) by mouth at bedtime as needed for anxiety. 30 tablet 1  . bisoprolol-hydrochlorothiazide (ZIAC) 10-6.25 MG tablet Take 1 tablet by mouth daily.    . bisoprolol-hydrochlorothiazide (ZIAC) 5-6.25 MG tablet Take 1 tablet by mouth daily.     Marland Kitchen buPROPion (WELLBUTRIN SR) 200 MG 12 hr tablet Take 200 mg by mouth 2 (two) times daily as needed.     . Calcium-Magnesium-Zinc  (CAL-MAG-ZINC PO) Take 1 tablet by mouth daily.    . citalopram (CELEXA) 10 MG tablet Take 10 mg by mouth daily.    . diclofenac sodium (VOLTAREN) 1 % GEL Apply 2 g topically 4 (four) times daily as needed (for pain in right thumb).    . fluticasone (FLONASE) 50 MCG/ACT nasal spray SPRAY 2 SPRAYS INTO EACH NOSTRIL EVERY DAY  2  . gabapentin (NEURONTIN) 300 MG capsule TAKE 1 CAPSULE BY MOUTH THREE TIMES A DAY 270 capsule 1  . hydrochlorothiazide (HYDRODIURIL) 12.5 MG tablet Take 1 tablet by mouth as needed. 1 tablet daily    . ibuprofen (ADVIL,MOTRIN) 600 MG tablet   0  . letrozole (FEMARA) 2.5 MG tablet TAKE 1 TABLET BY MOUTH EVERY DAY 90 tablet 0  . lidocaine-prilocaine (EMLA) cream Apply 1 application topically as needed. 30 g 3  . loratadine (CLARITIN) 10 MG tablet Take 10 mg by mouth daily as needed for allergies.    . Multiple Vitamins-Minerals (ADULT GUMMY PO) Take 2 tablets by mouth daily. One-A-Day Women's Multivitamin Gummies    . nystatin (MYCOSTATIN/NYSTOP) powder Apply topically  3 (three) times daily. 15 g 0  . vitamin C (ASCORBIC ACID) 500 MG tablet Take 500 mg by mouth daily.    . silver sulfADIAZINE (SILVADENE) 1 % cream Apply 1 application topically 2 (two) times daily. (Patient not taking: Reported on 03/15/2019) 50 g 3  . traMADol (ULTRAM) 50 MG tablet Take 1 tablet (50 mg total) by mouth every 6 (six) hours as needed (for pain.). (Patient not taking: Reported on 03/15/2019) 30 tablet 0   No current facility-administered medications for this visit.    OBJECTIVE: There were no vitals filed for this visit.   There is no height or weight on file to calculate BMI.    ECOG FS:0 - Asymptomatic  General: Well-developed, well-nourished, no acute distress. Eyes: Pink conjunctiva, anicteric sclera. HEENT: Normocephalic, moist mucous membranes. Breast: Right breast without evidence of recurrence. Lungs: No audible wheezing or coughing. Heart: Regular rate and rhythm. Abdomen: Soft,  nontender, no obvious distention. Musculoskeletal: No edema, cyanosis, or clubbing. Neuro: Alert, answering all questions appropriately. Cranial nerves grossly intact. Skin: No rashes or petechiae noted. Psych: Normal affect.  LAB RESULTS:  Lab Results  Component Value Date   NA 139 12/30/2017   K 3.7 12/30/2017   CL 106 12/30/2017   CO2 26 12/30/2017   GLUCOSE 106 (H) 12/30/2017   BUN 11 12/30/2017   CREATININE 0.68 12/30/2017   CALCIUM 9.1 12/30/2017   PROT 6.7 12/30/2017   ALBUMIN 3.7 12/30/2017   AST 18 12/30/2017   ALT 13 (L) 12/30/2017   ALKPHOS 106 12/30/2017   BILITOT 0.3 12/30/2017   GFRNONAA >60 12/30/2017   GFRAA >60 12/30/2017    Lab Results  Component Value Date   WBC 6.9 12/30/2017   NEUTROABS 4.6 12/10/2017   HGB 11.8 (L) 12/30/2017   HCT 35.2 12/30/2017   MCV 70.3 (L) 12/30/2017   PLT 235 12/30/2017     STUDIES: No results found.  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  PLAN:    1. 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  Patient completed neoadjuvant chemotherapy with Adriamycin, Cytoxan, and Taxol on September 08, 2017.  She then underwent partial mastectomy on October 14, 2017.  Patient noted to have residual disease, therefore proceeded with adjuvant XRT which she completed on January 06, 2018.  Continue letrozole for a total of 5 years completing treatment in June thousand 24.  Patient's most recent mammogram on September 21, 2018 was reported as BI-RADS 2.  Repeat mammogram in the next 1 to 2 weeks.  Return to clinic in 6 months for routine evaluation. 2.  Hot flashes: Chronic and relatively unchanged.  Continue letrozole as prescribed. 3.  Osteopenia: Patient's most recent bone mineral density on March 24, 2019 reported T score of -1.2 which is essentially unchanged from 1 year prior when the T score was reported -1.3.  Continue calcium and vitamin D supplementation.  4.  Insomnia/anxiety: Chronic and unchanged.  Continue Xanax as needed. 5.  Neuropathy: Patient does not complain of this today. 6.  Back pain: Chronic and unchanged.  Continue evaluation by orthopedics. 7  Clinical trials enrolled:  A011401 - BWEL: Randomized Phase III Trial Evaluating the Role of Weight Loss in Adjuvant Treatment of Overweight and Obese Women with Early Breast Cancer  [id number]: A  randomized phase III double blinded placebo-controlled trial of aspirin as adjuvant therapy for HER2 negative breast cancer: the ABC trial  I spent a total of 30 minutes reviewing chart data, face-to-face evaluation with the patient, counseling and coordination of care as detailed above.    Patient expressed understanding and was in agreement with this plan. She also understands that She can call clinic at any time with any questions, concerns, or complaints.   Cancer Staging Primary cancer of upper outer quadrant of right female breast [facility]) Staging form: Breast, AJCC 8th Edition - Clinical stage from 03/27/2017: Stage IB (cT1c, cN1, cM0, G2, ER: Positive, PR: Positive, HER2: Negative) - Signed by Lloyd Huger, MD on 03/27/2017 - Pathologic stage from 10/22/2017: No Stage Recommended (ypT1c, pN1a, cM0, G2, ER+, PR+, HER2-) - Signed by Lloyd Huger, MD on 10/22/2017   Lloyd Huger, MD   09/17/2019 7:20 AM

## 2019-09-15 ENCOUNTER — Other Ambulatory Visit: Payer: Self-pay

## 2019-09-15 ENCOUNTER — Encounter: Payer: Self-pay | Admitting: Oncology

## 2019-09-15 NOTE — Progress Notes (Signed)
Patient pre screened for office appointment, no questions or concerns today. Patient reminded of upcoming appointment time and date. 

## 2019-09-16 ENCOUNTER — Encounter: Payer: Self-pay | Admitting: *Deleted

## 2019-09-16 ENCOUNTER — Other Ambulatory Visit: Payer: Self-pay

## 2019-09-16 ENCOUNTER — Inpatient Hospital Stay: Payer: Medicaid Other | Attending: Oncology | Admitting: Oncology

## 2019-09-16 ENCOUNTER — Inpatient Hospital Stay: Payer: Medicaid Other

## 2019-09-16 DIAGNOSIS — C50411 Malignant neoplasm of upper-outer quadrant of right female breast: Secondary | ICD-10-CM

## 2019-09-16 DIAGNOSIS — M549 Dorsalgia, unspecified: Secondary | ICD-10-CM | POA: Diagnosis not present

## 2019-09-16 DIAGNOSIS — Z170 Estrogen receptor positive status [ER+]: Secondary | ICD-10-CM | POA: Diagnosis not present

## 2019-09-16 DIAGNOSIS — M8580 Other specified disorders of bone density and structure, unspecified site: Secondary | ICD-10-CM | POA: Insufficient documentation

## 2019-09-16 DIAGNOSIS — Z803 Family history of malignant neoplasm of breast: Secondary | ICD-10-CM | POA: Diagnosis not present

## 2019-09-16 DIAGNOSIS — G8929 Other chronic pain: Secondary | ICD-10-CM | POA: Diagnosis not present

## 2019-09-16 DIAGNOSIS — Z801 Family history of malignant neoplasm of trachea, bronchus and lung: Secondary | ICD-10-CM | POA: Insufficient documentation

## 2019-09-16 DIAGNOSIS — Z006 Encounter for examination for normal comparison and control in clinical research program: Secondary | ICD-10-CM | POA: Diagnosis present

## 2019-09-16 DIAGNOSIS — Z87891 Personal history of nicotine dependence: Secondary | ICD-10-CM | POA: Insufficient documentation

## 2019-09-16 DIAGNOSIS — Z95828 Presence of other vascular implants and grafts: Secondary | ICD-10-CM

## 2019-09-16 DIAGNOSIS — G4700 Insomnia, unspecified: Secondary | ICD-10-CM | POA: Diagnosis not present

## 2019-09-16 DIAGNOSIS — Z8042 Family history of malignant neoplasm of prostate: Secondary | ICD-10-CM | POA: Insufficient documentation

## 2019-09-16 DIAGNOSIS — F419 Anxiety disorder, unspecified: Secondary | ICD-10-CM | POA: Diagnosis not present

## 2019-09-16 DIAGNOSIS — R232 Flushing: Secondary | ICD-10-CM | POA: Insufficient documentation

## 2019-09-16 DIAGNOSIS — Z79811 Long term (current) use of aromatase inhibitors: Secondary | ICD-10-CM | POA: Insufficient documentation

## 2019-09-16 MED ORDER — HEPARIN SOD (PORK) LOCK FLUSH 100 UNIT/ML IV SOLN
INTRAVENOUS | Status: AC
  Filled 2019-09-16: qty 5

## 2019-09-16 MED ORDER — SODIUM CHLORIDE 0.9% FLUSH
10.0000 mL | Freq: Once | INTRAVENOUS | Status: AC
  Administered 2019-09-16: 10 mL via INTRAVENOUS
  Filled 2019-09-16: qty 10

## 2019-09-16 MED ORDER — HEPARIN SOD (PORK) LOCK FLUSH 100 UNIT/ML IV SOLN
500.0000 [IU] | Freq: Once | INTRAVENOUS | Status: AC
  Administered 2019-09-16: 500 [IU] via INTRAVENOUS
  Filled 2019-09-16: qty 5

## 2019-09-16 NOTE — Research (Signed)
Chona Moothart presented to clinic this morning to see Dr. Grayland Ormond and be evaluated for her 94 month study visit on the A011401 BWEL study. Reviewed solicited AE's for 18 month time point. Prior to any study procedures, the patient was reconsented to [id number] Protocol consent version date: 02/25/19 along with corresponding/updated [place] HIPAA. The consent form changes were reviewed with the patient prior to consent and Ms. Ochoa elected to participate in the new Imaging sub-study. She was also informed about the [facility] image submission site and the this will involve submission of a CT scan she received at the time her breast cancer was initially diagnosed. Patient was given a copy of the signed informed consent and HIPAA forms. Patient was seen in clinic by Dr. Grayland Ormond as scheduled for her H&P. States she has not been able to be as active due to suffering from back pain and numbness in her feet and legs. Ms. Frye completed the18 month BWEL study questionnaire while in clinic. Patient's weight and waist & hip measurements were checked twice per protocol with patient removing shoes and wearing light clothing. Weight was 117.48kg and 117.53kg after having patient step off scales, then resetting for second weight check. Waist measured 112.5cm and 112.5cm respectively at narrowest section between the lowest rib and the iliac crest and hips measured 143cm and 142.2 respectively at maximum circumference while patient standing straight up with feet approximately 10cm apart. Solicited and other AE's with grade and attribution were assessed as noted below.Patient reports having had multiple dental appointments over the past few months because she could not have dental work while on active chemotherapy. States her son is concerned about her and gave her a list of questions for Dr. Grayland Ormond to answer today. Bilateral mammogram scheduled for 09/28/2019.  Study/Protocol: [id number] BWEL Cycle: 18 month Event Grade Onset Date  Resolved Date Drug Name Aspirin/placebo Attribution Treatment Comments  Fractures 0       Solicited AE  Sprains 0      Solicited AE  Tendon or ligament injury 0        Solicited AE  Orthopedic surgery 0        Solicited AE    *OTHER AE's*           Pedal edema 1 unknown   unrelated  none States this has worsened  Hot flashes 1 unknown   Letrozole  none   Arthralgias 1 unknown   Letrozole  none Pain in hands  Bruising 1 unknown 09/16/2019  unrelated  none Denies bruising today  Back pain 2 01/05/2019   unrelated    Neuropathy in BLE 1 01/05/2019   unrelated gabapentin   Yolande Jolly, BSN, MHA, OCN 09/16/2019 1:11 PM

## 2019-09-16 NOTE — Research (Signed)
Destiny Rogers presented to clinic this morning as scheduled for her 18 month visit on the A011502 ABC study. She returned her old bottle of Aspirin/placebo with 49 tablets left in the bottle. Reports she missed several doses to to having dental work on multiple occasions since her last study visit. She also returned her completed medication diaries and has marked the days she missed taking the IP.  Patient missed 47 days of the study drug due to dental appointments making her compliance rate 74%. She denies using any other aspirin products or NSAIDs this reporting period. Solicited and other adverse events were assessed as below. Patient was given new medication calendars for the next 6 months and a new bottle of aspirin/placebo 300mg  tabs. #200 tablets labeled with correct patient study ID number [id number] - verified by myself and pharmacist Debbora Lacrosse - was dispensed to patient. Patient was reminded to take one tablet daily with food and mark the time on her new calendars beginning today. My contact information was written on the medication calendar and patient was also given my business card to keep handy. Instructed her to call me with any questions and especially prior to running out of study drug. Destiny Rogers agreed to this plan, and states she does have additional dental work to be done. Her mammogram was scheduled for 09/28/2019. Patient was informed that her next study visit will include a study questionnaire and fasting study labs. Solicited and other adverse events as listed below with grade and attribution.  Study/Protocol: [id number] - ABC  Cycle: 18 month Event Grade Onset Date Resolved Date Drug Name Aspirin/placebo Attribution Treatment Comments  Gastrointestinal Bleeding 0        Intracranial Hemorrhage 0        Epistaxis 0        Hematuria 0        Dyspepsia 1 04/21/2017 09/07/2018    unrelated  none Denies at present  Gastritis 0        Bruising 1 reported  09/07/2018 09/16/2019    Denies bruising today  Pedal edema 1 unknown   unrelated  none States this has worsened  Hot flashes 1 unknown   Letrozole    Arthralgias 1 unknown   Letrozole  Pain in hands  Back pain 2 01/05/2019   unrelated    Neuropathy in BLE 1 01/05/2019   unrelated gabapentin   Yolande Jolly, BSN, MHA, OCN 09/16/2019 1:12 PM

## 2019-09-20 ENCOUNTER — Inpatient Hospital Stay: Payer: Medicaid Other

## 2019-09-28 ENCOUNTER — Ambulatory Visit
Admission: RE | Admit: 2019-09-28 | Discharge: 2019-09-28 | Disposition: A | Discharge status: Home / Self Care | Payer: Medicaid Other | Source: Ambulatory Visit | Attending: Oncology | Admitting: Oncology

## 2019-09-28 DIAGNOSIS — C50411 Malignant neoplasm of upper-outer quadrant of right female breast: Secondary | ICD-10-CM

## 2019-11-04 ENCOUNTER — Inpatient Hospital Stay: Payer: Medicare HMO

## 2019-11-04 ENCOUNTER — Other Ambulatory Visit: Payer: Self-pay

## 2019-11-04 ENCOUNTER — Inpatient Hospital Stay: Payer: Medicare HMO | Attending: Oncology

## 2019-11-04 DIAGNOSIS — C50411 Malignant neoplasm of upper-outer quadrant of right female breast: Secondary | ICD-10-CM | POA: Diagnosis present

## 2019-11-04 DIAGNOSIS — Z452 Encounter for adjustment and management of vascular access device: Secondary | ICD-10-CM | POA: Diagnosis not present

## 2019-11-04 DIAGNOSIS — Z95828 Presence of other vascular implants and grafts: Secondary | ICD-10-CM

## 2019-11-04 MED ORDER — SODIUM CHLORIDE 0.9% FLUSH
10.0000 mL | Freq: Once | INTRAVENOUS | Status: AC
  Administered 2019-11-04: 10 mL via INTRAVENOUS
  Filled 2019-11-04: qty 10

## 2019-11-04 MED ORDER — HEPARIN SOD (PORK) LOCK FLUSH 100 UNIT/ML IV SOLN
INTRAVENOUS | Status: AC
  Filled 2019-11-04: qty 5

## 2019-11-04 MED ORDER — HEPARIN SOD (PORK) LOCK FLUSH 100 UNIT/ML IV SOLN
500.0000 [IU] | Freq: Once | INTRAVENOUS | Status: AC
  Administered 2019-11-04: 500 [IU] via INTRAVENOUS
  Filled 2019-11-04: qty 5

## 2019-11-09 ENCOUNTER — Encounter: Payer: Self-pay | Admitting: Oncology

## 2019-11-11 MED ORDER — INV-ASPIRIN/PLACEBO 300 MG TABS ALLIANCE A011502: 1.0000 | ORAL_TABLET | Freq: Every day | ORAL | 0 refills | Status: DC

## 2019-11-30 ENCOUNTER — Telehealth: Payer: Self-pay

## 2019-11-30 ENCOUNTER — Other Ambulatory Visit: Payer: Self-pay | Admitting: Oncology

## 2019-11-30 NOTE — Telephone Encounter (Signed)
Patient called upset about her medication Femara. States her pharmacy ([facility]) would not fill her prescription and said she had no refills. Patient stated she only had 2 pills left. Reviewed patient's chart and informed patient a refill had been sent to her pharmacy today for 90 tabs and no refills. Advised patient to call her pharmacy and see if they had refill ready. Let patient know to call us if she had any problems refilling.   Patient verbalized understanding and denied further questions.

## 2019-12-16 ENCOUNTER — Other Ambulatory Visit: Payer: Self-pay

## 2019-12-16 ENCOUNTER — Inpatient Hospital Stay: Payer: Medicare HMO | Attending: Oncology

## 2019-12-16 ENCOUNTER — Inpatient Hospital Stay: Payer: Medicare HMO

## 2019-12-16 DIAGNOSIS — Z452 Encounter for adjustment and management of vascular access device: Secondary | ICD-10-CM | POA: Insufficient documentation

## 2019-12-16 DIAGNOSIS — C50411 Malignant neoplasm of upper-outer quadrant of right female breast: Secondary | ICD-10-CM | POA: Insufficient documentation

## 2019-12-16 DIAGNOSIS — Z95828 Presence of other vascular implants and grafts: Secondary | ICD-10-CM

## 2019-12-16 MED ORDER — HEPARIN SOD (PORK) LOCK FLUSH 100 UNIT/ML IV SOLN
500.0000 [IU] | Freq: Once | INTRAVENOUS | Status: AC
  Administered 2019-12-16: 500 [IU] via INTRAVENOUS
  Filled 2019-12-16: qty 5

## 2019-12-16 MED ORDER — HEPARIN SOD (PORK) LOCK FLUSH 100 UNIT/ML IV SOLN
INTRAVENOUS | Status: AC
  Filled 2019-12-16: qty 5

## 2019-12-16 MED ORDER — SODIUM CHLORIDE 0.9% FLUSH
10.0000 mL | Freq: Once | INTRAVENOUS | Status: AC
  Administered 2019-12-16: 10 mL via INTRAVENOUS
  Filled 2019-12-16: qty 10

## 2020-01-27 ENCOUNTER — Inpatient Hospital Stay: Payer: Medicare HMO

## 2020-01-30 ENCOUNTER — Other Ambulatory Visit: Payer: Self-pay

## 2020-01-30 ENCOUNTER — Inpatient Hospital Stay: Payer: Medicare HMO

## 2020-01-30 ENCOUNTER — Inpatient Hospital Stay: Payer: Medicare HMO | Attending: Oncology

## 2020-01-30 DIAGNOSIS — C50411 Malignant neoplasm of upper-outer quadrant of right female breast: Secondary | ICD-10-CM | POA: Insufficient documentation

## 2020-01-30 DIAGNOSIS — Z452 Encounter for adjustment and management of vascular access device: Secondary | ICD-10-CM | POA: Insufficient documentation

## 2020-01-30 DIAGNOSIS — Z95828 Presence of other vascular implants and grafts: Secondary | ICD-10-CM

## 2020-01-30 DIAGNOSIS — Z170 Estrogen receptor positive status [ER+]: Secondary | ICD-10-CM | POA: Diagnosis not present

## 2020-01-30 MED ORDER — HEPARIN SOD (PORK) LOCK FLUSH 100 UNIT/ML IV SOLN
500.0000 [IU] | Freq: Once | INTRAVENOUS | Status: AC
  Administered 2020-01-30: 500 [IU] via INTRAVENOUS
  Filled 2020-01-30: qty 5

## 2020-01-30 MED ORDER — SODIUM CHLORIDE 0.9% FLUSH
10.0000 mL | Freq: Once | INTRAVENOUS | Status: AC
  Administered 2020-01-30: 10 mL via INTRAVENOUS
  Filled 2020-01-30: qty 10

## 2020-01-30 MED ORDER — HEPARIN SOD (PORK) LOCK FLUSH 100 UNIT/ML IV SOLN
INTRAVENOUS | Status: AC
  Filled 2020-01-30: qty 5

## 2020-02-22 ENCOUNTER — Other Ambulatory Visit: Payer: Self-pay | Admitting: Oncology

## 2020-02-28 ENCOUNTER — Telehealth: Payer: Self-pay | Admitting: *Deleted

## 2020-02-28 NOTE — Telephone Encounter (Signed)
Reminder phone call made to patient Destiny Rogers, who is coming to clinic tomorrow for her 24 month ABC and BWEL study visits. Patient is to having fasting labs collected and she was reminded not to eat or drink anything after 11:00 tonight in preparation for her 11:00 AM study lab collection tomorrow morning. Patient verbalizes understanding of this and states she has done this for the study in the past. Yolande Jolly, BSN, MHA, OCN 02/28/2020 1:49 PM

## 2020-02-29 ENCOUNTER — Other Ambulatory Visit: Payer: Self-pay

## 2020-02-29 ENCOUNTER — Encounter: Payer: Self-pay | Admitting: Radiation Oncology

## 2020-02-29 ENCOUNTER — Encounter: Payer: Self-pay | Admitting: *Deleted

## 2020-02-29 ENCOUNTER — Ambulatory Visit
Admission: RE | Admit: 2020-02-29 | Discharge: 2020-02-29 | Disposition: A | Discharge status: Home / Self Care | Payer: Medicare HMO | Source: Ambulatory Visit | Attending: Radiation Oncology | Admitting: Radiation Oncology

## 2020-02-29 ENCOUNTER — Inpatient Hospital Stay: Payer: Medicare HMO

## 2020-02-29 ENCOUNTER — Inpatient Hospital Stay: Payer: Medicare HMO | Attending: Oncology

## 2020-02-29 VITALS — BP 145/100 | HR 104 | Temp 97.1°F | Wt 258.0 lb

## 2020-02-29 DIAGNOSIS — Z9221 Personal history of antineoplastic chemotherapy: Secondary | ICD-10-CM | POA: Insufficient documentation

## 2020-02-29 DIAGNOSIS — M549 Dorsalgia, unspecified: Secondary | ICD-10-CM | POA: Insufficient documentation

## 2020-02-29 DIAGNOSIS — Z006 Encounter for examination for normal comparison and control in clinical research program: Secondary | ICD-10-CM | POA: Diagnosis not present

## 2020-02-29 DIAGNOSIS — Z170 Estrogen receptor positive status [ER+]: Secondary | ICD-10-CM | POA: Insufficient documentation

## 2020-02-29 DIAGNOSIS — Z923 Personal history of irradiation: Secondary | ICD-10-CM | POA: Diagnosis not present

## 2020-02-29 DIAGNOSIS — Z79899 Other long term (current) drug therapy: Secondary | ICD-10-CM | POA: Insufficient documentation

## 2020-02-29 DIAGNOSIS — Z79811 Long term (current) use of aromatase inhibitors: Secondary | ICD-10-CM | POA: Insufficient documentation

## 2020-02-29 DIAGNOSIS — C50411 Malignant neoplasm of upper-outer quadrant of right female breast: Secondary | ICD-10-CM

## 2020-02-29 DIAGNOSIS — M79676 Pain in unspecified toe(s): Secondary | ICD-10-CM | POA: Insufficient documentation

## 2020-02-29 DIAGNOSIS — M8580 Other specified disorders of bone density and structure, unspecified site: Secondary | ICD-10-CM | POA: Insufficient documentation

## 2020-02-29 DIAGNOSIS — G8929 Other chronic pain: Secondary | ICD-10-CM | POA: Insufficient documentation

## 2020-02-29 DIAGNOSIS — Z87891 Personal history of nicotine dependence: Secondary | ICD-10-CM | POA: Insufficient documentation

## 2020-02-29 DIAGNOSIS — R109 Unspecified abdominal pain: Secondary | ICD-10-CM | POA: Insufficient documentation

## 2020-02-29 DIAGNOSIS — N951 Menopausal and female climacteric states: Secondary | ICD-10-CM | POA: Insufficient documentation

## 2020-02-29 LAB — COMPREHENSIVE METABOLIC PANEL
ALT: 24 U/L (ref 0–44)
AST: 27 U/L (ref 15–41)
Albumin: 3.8 g/dL (ref 3.5–5.0)
Alkaline Phosphatase: 141 U/L — ABNORMAL HIGH (ref 38–126)
Anion gap: 11 (ref 5–15)
BUN: 14 mg/dL (ref 6–20)
CO2: 27 mmol/L (ref 22–32)
Calcium: 9.1 mg/dL (ref 8.9–10.3)
Chloride: 106 mmol/L (ref 98–111)
Creatinine, Ser: 1 mg/dL (ref 0.44–1.00)
GFR calc Af Amer: >60 mL/min (ref >60)
GFR calc non Af Amer: >60 mL/min (ref >60)
Glucose, Bld: 119 mg/dL — ABNORMAL HIGH (ref 70–99)
Potassium: 3.8 mmol/L (ref 3.5–5.1)
Sodium: 144 mmol/L (ref 135–145)
Total Bilirubin: 0.4 mg/dL (ref 0.3–1.2)
Total Protein: 7.6 g/dL (ref 6.5–8.1)

## 2020-02-29 LAB — CBC WITH DIFFERENTIAL/PLATELET
Abs Immature Granulocytes: 0.08 K/uL — ABNORMAL HIGH (ref 0.00–0.07)
Basophils Absolute: 0 K/uL (ref 0.0–0.1)
Basophils Relative: 1 %
Eosinophils Absolute: 0.1 K/uL (ref 0.0–0.5)
Eosinophils Relative: 2 %
HCT: 40.2 % (ref 36.0–46.0)
Hemoglobin: 13.2 g/dL (ref 12.0–15.0)
Immature Granulocytes: 1 %
Lymphocytes Relative: 28 %
Lymphs Abs: 2.5 K/uL (ref 0.7–4.0)
MCH: 23.2 pg — ABNORMAL LOW (ref 26.0–34.0)
MCHC: 32.8 g/dL (ref 30.0–36.0)
MCV: 70.7 fL — ABNORMAL LOW (ref 80.0–100.0)
Monocytes Absolute: 0.8 K/uL (ref 0.1–1.0)
Monocytes Relative: 9 %
Neutro Abs: 5.3 K/uL (ref 1.7–7.7)
Neutrophils Relative %: 59 %
Platelets: 254 K/uL (ref 150–400)
RBC: 5.69 MIL/uL — ABNORMAL HIGH (ref 3.87–5.11)
RDW: 14.8 % (ref 11.5–15.5)
WBC: 8.8 K/uL (ref 4.0–10.5)
nRBC: 0 % (ref 0.0–0.2)

## 2020-02-29 MED ORDER — HEPARIN SOD (PORK) LOCK FLUSH 100 UNIT/ML IV SOLN
500.0000 [IU] | Freq: Once | INTRAVENOUS | Status: AC
  Administered 2020-02-29: 500 [IU] via INTRAVENOUS
  Filled 2020-02-29: qty 5

## 2020-02-29 MED ORDER — SODIUM CHLORIDE 0.9% FLUSH
10.0000 mL | Freq: Once | INTRAVENOUS | Status: AC
  Administered 2020-02-29: 10 mL via INTRAVENOUS
  Filled 2020-02-29: qty 10

## 2020-02-29 NOTE — Research (Signed)
Destiny Rogers presented to clinic this morning as scheduled for her 24 month visit on the A011502 ABC study. Central study labs were collected this morning and both whole blood and urine samples were shipped out on a cold pack per protocol instructions. Destiny Rogers became a little weak and short of breath after having labs collected, likely from having fasted for more than 13 hours. She was given some juice and some water to rehydrate her and was transported via wheelchair to the Research Department for the rest of her study assessments. Patient returned her medication diaries, but did not return the old bottle of Aspirin/placebo. States there was only one tablet left and her son put it in her pillbox and threw away the bottle without checking with her first. Per her medication calendars, patient did not miss any doses of Aspirin/placebo since her last clinic visit, which means her compliance rate would be 100%. She should have had about 34 tabs left out of a bottle of 200, but patient reports having only 1 tab remaining. She denies using any other aspirin products or NSAIDs this reporting period. Solicited and other adverse events were assessed as below. Patient was given new medication calendars for the next 6 months and a new bottle of aspirin/placebo 300mg  tabs. #200 tablets labeled with correct patient study ID number [id number] - verified by myself and pharmacist Debbora Lacrosse - was dispensed to patient. Patient was reminded to take one tablet daily with food and mark the time on her new calendars beginning today. Patient weight was 256.9 this morning and BMI is 46.2. VS were assessed by physician's nurse as follows: HR-104; B/P-145/100. She has not yet taken her medication due to fasting states for study labs. Mammogram was performed 09/28/2019 and showed " no evidence of breast malignancy." Patient was informed that this is the last time she will complete study questionnaires and have study labs for this particular  study. Destiny Rogers was transported to the bus via wheelchair after study assessments were completed, but ambulated unassisted to get onto the bus to be transported home. Solicited and other adverse events as listed below with grade and attribution.  Study/Protocol: [id number] - ABC  Cycle: 24 month Event Grade Onset Date Resolved Date Drug Name Aspirin/placebo Attribution Treatment Comments  Gastrointestinal Bleeding 0        Intracranial Hemorrhage 0        Epistaxis 0        Hematuria 0        Dyspepsia 0          Gastritis 0        Bruising 1 08/11/202102/18/2020  09/16/2019  possible  States no more than her usual  Pedal edema 1 unknown   unrelated  none States this has worsened  Hot flashes 1 unknown   Letrozole    Arthralgias 1 unknown   Letrozole  Pain in hands  Back pain 2 01/05/2019   unrelated    Neuropathy in BLE 1 01/05/2019   unrelated gabapentin   Destiny Rogers, BSN, MHA, OCN 02/29/2020 12:23 PM

## 2020-02-29 NOTE — Research (Signed)
Destiny Rogers presented to clinic this morning to see Dr. Baruch Gouty and be evaluated for her 24 month study visit on the A011401 BWEL study. She will see Dr. Grayland Ormond as well on 03/15/20. Central study labs collected per protocol for the 24 month time point. Patient verified prior to lab collection that she has been fasting for more than 13 hours, and her fasting glucose this morning was 119 mg/dL.  Destiny Rogers became a little weak and short of breath after having labs collected, likely from having fasted for more than 13 hours. She was given some juice and some water to rehydrate her and was transported via wheelchair to the Research Department for the rest of her study assessments. Reviewed solicited AE's for 24 month time point, and patient denies experiencing any of these. States she continues to experience back pain and numbness in her feet and legs, and this has limited her activity. Destiny Rogers completed the 24 month BWEL study questionnaire while in clinic. Patient's weight and waist & hip measurements were checked twice per protocol with patient removing shoes and wearing light clothing. Weight was 116.53 kg and 116.57 kg after having patient step off scales, then resetting for second weight check. Waist measured 111.5 cm and 112 cm respectively at narrowest section between the lowest rib and the iliac crest and hips measured 141 cm and 140.5 cm respectively at maximum circumference while patient standing straight up with feet approximately 10cm apart. Patient reports having had multiple dental appointments over the past few months because she could not have dental work while on active chemotherapy, and states she has a new partial she is trying to get used to. Reports she has not been eating as well in the past few weeks for this reason. Bilateral mammogram was performed 09/28/2019, and showed "no evidence of breast malignancy." Discussed with Destiny Rogers that this is her official last "active" study visit, but  that we will continue to follow her every 6 weeks for another year, then annually; however she will not have to have study labs collected again. Patient is interested in having her port-a-cath removed and plant to talk with Dr. Grayland Ormond about this. Destiny Rogers was transported to the bus via wheelchair after study assessments were completed, but ambulated unassisted to get onto the bus to be transported home. Solicited and other AE's with grade and attribution were assessed as noted below. Study labs were processed and shipped out this afternoon on dry ice per protocol instructions.  Study/Protocol: [id number] BWEL Cycle: 24 month Event Grade Onset Date Resolved Date Drug Name Aspirin/placebo Attribution Treatment Comments  Fractures 0       Solicited AE  Sprains 0      Solicited AE  Tendon or ligament injury 0        Solicited AE  Orthopedic surgery 0        Solicited AE    *OTHER AE's*           Pedal edema 1 unknown   unrelated  none States this has worsened  Hot flashes 1 unknown   Letrozole  none   Arthralgias 1 unknown   Letrozole  none Pain in hands  Back pain 2 01/05/2019   unrelated    Neuropathy in BLE 1 01/05/2019   unrelated gabapentin   Yolande Jolly, BSN, MHA, OCN 02/29/2020 12:22 PM

## 2020-02-29 NOTE — Progress Notes (Signed)
.  Radiation Oncology Follow up Note  Name: Destiny Rogers   Date:   02/29/2020 MRN:  [id number] DOB: Oct 30, 1960    This 59 y.o. 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  She specifically denies breast tenderness cough or bone pain..  She had mammograms back in March which I have reviewed were BI-RADS 2 benign.  She is currently on letrozole tolerating it well without side effect.  COMPLICATIONS OF TREATMENT: none  FOLLOW UP COMPLIANCE: keeps appointments   PHYSICAL EXAM:  BP (!) 145/100 (BP Location: Left Arm, Patient Position: Sitting, Cuff Size: Large)   Pulse (!) 104   Temp (!) 97.1 F (36.2 C) (Tympanic)   Wt 258 lb (117 kg)   LMP 04/09/2005 Comment: Tubal Ligation  BMI 43.60 kg/m  Examination left breast shows a slight nodular density in the subareolar region I believe is unchanged from prior examination consistent with a cyst.  This has been there for years does not show on mammogram although I have asked her to have that marked next time she has her mammograms in March.  No other dominant mass or nodularity is noted in either breast in 2 positions examined.  No axillary or supraclavicular adenopathy is identified.  Well-developed well-nourished patient in NAD. HEENT reveals PERLA, EOMI, discs not visualized.  Oral cavity is clear. No oral mucosal lesions are identified. Neck is clear without evidence of cervical or supraclavicular adenopathy. Lungs are clear to A&P. Cardiac examination is essentially unremarkable with regular rate and rhythm without murmur rub or thrill. Abdomen is benign with no  organomegaly or masses noted. Motor sensory and DTR levels are equal and symmetric in the upper and lower extremities. Cranial nerves II through XII are grossly intact. Proprioception is intact. No peripheral adenopathy or edema is identified. No motor or sensory levels are noted. Crude visual fields are within normal range.  RADIOLOGY RESULTS: Mammograms reviewed compatible with above-stated findings  PLAN: Present time patient is doing well of asked to see her back in 1 year for follow-up.  She will have that slight area marked at the next mammogram although I believe this is benign and has been present for several years unchanged.  Patient knows to call with any concerns.  She continues on letrozole without side effect.  I was asked to see her back in 1 year.  I would like to take this opportunity to thank you for allowing me to participate in the care of your patient.Noreene Filbert, MD

## 2020-03-06 ENCOUNTER — Encounter: Payer: Self-pay | Admitting: Oncology

## 2020-03-06 MED ORDER — INV-ASPIRIN/PLACEBO 300 MG TABS ALLIANCE A011502: 1.0000 | ORAL_TABLET | Freq: Every day | ORAL | 0 refills | Status: DC

## 2020-03-07 ENCOUNTER — Telehealth: Payer: Self-pay | Admitting: *Deleted

## 2020-03-07 NOTE — Telephone Encounter (Signed)
T/C made to Destiny Rogers for purpose of informing her that one of the study specimens collected last week was delayed by [employer] in arriving at the study lab, and was not usable by the time it did arrive. Patient was asked whether she would be willing to have her study labs for the ABC study recollected while she is here next week seeing Dr. Grayland Ormond, and she graciously agreed. Will schedule lab appointment for study labs only to be collected just prior to her appointment with Dr. Grayland Ormond on 03/15/2020. Yolande Jolly, BSN, MHA, OCN 03/07/2020 2:38 PM

## 2020-03-08 NOTE — Progress Notes (Signed)
[place]  [phone] [phone]  ID: Destiny Rogers OB: 03/31/1961  MR#: [id number]  QJF#:[id number]  Patient Care Team: Maryland Pink, MD as PCP - General (Family Medicine) Rico Junker, RN as Registered Nurse Grayland Ormond Kathlene November, MD as Consulting Physician (Oncology) Jacquelin Hawking, NP as Nurse Practitioner (Oncology) Herbert Pun, MD as Consulting Physician (General Surgery) Noreene Filbert, MD as Referring Physician (Radiation Oncology)  I connected with Destiny Rogers on 03/15/20 at 11:00 AM EDT by video enabled telemedicine visit and verified that I am speaking with the correct person using two identifiers.   I discussed the limitations, risks, security and privacy concerns of performing an evaluation and management service by telemedicine and the availability of in-person appointments. I also discussed with the patient that there may be a patient responsible charge related to this service. The patient expressed understanding and agreed to proceed.   Other persons participating in the visit and their role in the encounter: Patient, MD.  Patients location: Clinic. Providers location: Home.  CHIEF COMPLAINT: Clinical stage IB ER/PR positive, HER-2/neu negative invasive carcinoma of the upper outer quadrant of the right breast.  INTERVAL HISTORY: Patient agreed to video assisted telemedicine visit for her routine 49-month evaluation.  She has some intermittent right flank pain that is chronic and unchanged.  She continues to have daily hot flashes with letrozole as well.  She has some toe pain secondary to cutting her nails too close.  She has no neurologic complaints.  She does not complain of weakness or fatigue today.  She has a good appetite and denies weight loss.  She denies any chest pain, shortness of breath, cough, or hemoptysis.  She denies any nausea, vomiting, constipation, or diarrhea. She has no urinary  complaints.  Patient offers no further specific complaints today.  REVIEW OF SYSTEMS:   Review of Systems  Constitutional: Negative.  Negative for fever, malaise/fatigue and weight loss.  Respiratory: Negative.  Negative for cough and shortness of breath.   Cardiovascular: Negative.  Negative for chest pain and leg swelling.  Gastrointestinal: Negative.  Negative for abdominal pain.  Genitourinary: Positive for flank pain. Negative for dysuria.  Musculoskeletal: Negative for back pain and joint pain.  Skin: Negative.  Negative for itching and rash.  Neurological: Positive for sensory change. Negative for tingling, focal weakness and weakness.  Psychiatric/Behavioral: The patient is not nervous/anxious and does not have insomnia.     As per HPI. Otherwise, a complete review of systems is negative.  PAST MEDICAL HISTORY: Past Medical History:  Diagnosis Date   Anemia    Arthritis    Breast cancer ([place]) 2018   Right breast cancer- [facility]   Dyspnea    SINCE STARTING CHEMO WITH EXERTION   GERD (gastroesophageal reflux disease)    NO MEDS   Heart murmur    DX IN TEENS-ASYMPTOMATIC   Hypertension    Migraines    H/O MIGRAINES   Personal history of chemotherapy 2018   finished 09/08/17   Personal history of radiation therapy    Rt. breast   Thumb fracture    LEFT-WEARING BRACE    PAST SURGICAL HISTORY: Past Surgical History:  Procedure Laterality Date   ABLATION  2006   BREAST BIOPSY Right 03/19/2017    [place] and [place] in lymph node   BREAST LUMPECTOMY Right 10/14/2017   CESAREAN SECTION     FRACTURE SURGERY Left 2013   left hand, thumb (screws and titanium)   INCISION  AND DRAINAGE ABSCESS Right 04/26/2018   Procedure: INCISION AND DRAINAGE ABSCESS;  Surgeon: Herbert Pun, MD;  Location: [facility] OR[facility];  Service: General;  Laterality: Right;   PARTIAL MASTECTOMY WITH NEEDLE LOCALIZATION Right 10/14/2017   Procedure: PARTIAL MASTECTOMY WITH NEEDLE  LOCALIZATION;  Surgeon: Herbert Pun, MD;  Location: [facility] OR[facility];  Service: General;  Laterality: Right;   PILONIDAL CYST EXCISION  1980'S   PORT-A-CATH REMOVAL N/A 04/27/2017   Procedure: REMOVAL OF PORT A CATH & insertion PORT A CATH LEFT INTERNAL JUGULAR;  Surgeon: Herbert Pun, MD;  Location: [facility] OR[facility];  Service: General;  Laterality: N/A;   PORTACATH PLACEMENT N/A 04/15/2017   Procedure: INSERTION PORT-A-CATH;  Surgeon: Herbert Pun, MD;  Location: [facility] OR[facility];  Service: General;  Laterality: N/A;   SENTINEL NODE BIOPSY Right 10/14/2017   Procedure: SENTINEL NODE BIOPSY;  Surgeon: Herbert Pun, MD;  Location: [facility];  Service: General;  Laterality: Right;    FAMILY HISTORY: Family History  Problem Relation Age of Onset   Breast cancer Maternal Grandmother    Hypertension Maternal Grandmother    Angina Mother    Lung cancer Father    Diabetes Father    Prostate cancer Maternal Uncle    Dementia Maternal Grandfather    Congestive Heart Failure Maternal Grandfather    Heart disease Paternal Grandmother     ADVANCED DIRECTIVES (Y/N):  N  HEALTH MAINTENANCE: Social History   Tobacco Use   Smoking status: Former Smoker    Packs/day: 2.00    Years: 18.00    Pack years: 36.00    Types: Cigarettes    Quit date: 05/01/2017    Years since quitting: 2.8   Smokeless tobacco: Never Used   Tobacco comment: quit 4 months ago  Vaping Use   Vaping Use: Never used  Substance Use Topics   Alcohol use: No   Drug use: No     Colonoscopy:  PAP:  Bone density:  Lipid panel:  Allergies  Allergen Reactions   Hydrocodone-Acetaminophen Itching and Rash   Penicillins Swelling, Other (See Comments) and Rash    Throat swelling Has patient had a PCN reaction causing immediate rash, facial/tongue/throat swelling, SOB or lightheadedness with hypotension: Yes Has patient had a PCN reaction causing severe rash involving mucus membranes or  skin necrosis: No Has patient had a PCN reaction that required hospitalization: No Has patient had a PCN reaction occurring within the last 10 years: No If all of the above answers are "NO", then may proceed with Cephalosporin use.     Current Outpatient Medications  Medication Sig Dispense Refill   acetaminophen (TYLENOL) 500 MG tablet Take 1,000 mg by mouth every 8 (eight) hours as needed for mild pain.     ALPRAZolam (XANAX) 0.25 MG tablet Take 1 tablet (0.25 mg total) by mouth at bedtime as needed for anxiety. 30 tablet 1   bisoprolol-hydrochlorothiazide (ZIAC) 10-6.25 MG tablet Take 1 tablet by mouth daily.     Calcium-Magnesium-Zinc (CAL-MAG-ZINC PO) Take 1 tablet by mouth daily.     diclofenac sodium (VOLTAREN) 1 % GEL Apply 2 g topically 4 (four) times daily as needed (for pain in right thumb).     fluticasone (FLONASE) 50 MCG/ACT nasal spray SPRAY 2 SPRAYS INTO EACH NOSTRIL EVERY DAY  2   gabapentin (NEURONTIN) 300 MG capsule TAKE 1 CAPSULE BY MOUTH THREE TIMES A DAY 270 capsule 1   hydrochlorothiazide (HYDRODIURIL) 12.5 MG tablet Take 1 tablet by mouth as needed. 1 tablet daily  ibuprofen (ADVIL,MOTRIN) 600 MG tablet   0   Investigational aspirin/placebo 300 MG tablet Alliance [id number] Take 1 tablet by mouth daily. Take with food or a full glass of water.  Do not crush enteric coated tablets. 180 tablet 0   Investigational aspirin/placebo 300 MG tablet Alliance [id number] Take 1 tablet by mouth daily. Take with food or a full glass of water.  Do not crush enteric coated tablets. 180 tablet 0   letrozole (FEMARA) 2.5 MG tablet TAKE 1 TABLET BY MOUTH EVERY DAY 90 tablet 3   lidocaine-prilocaine (EMLA) cream Apply 1 application topically as needed. 30 g 3   loratadine (CLARITIN) 10 MG tablet Take 10 mg by mouth daily as needed for allergies.     Multiple Vitamins-Minerals (ADULT GUMMY PO) Take 2 tablets by mouth daily. One-A-Day Women's Multivitamin Gummies     No  current facility-administered medications for this visit.    OBJECTIVE: Vitals:   03/15/20 1057  BP: 123/86  Pulse: (!) 110  Resp: 20  Temp: 98.4 F (36.9 C)  SpO2: 95%     Body mass index is 43.47 kg/m.    ECOG FS:0 - Asymptomatic  General: Well-developed, well-nourished, no acute distress. HEENT: Normocephalic. Breast/right flank: No obvious rashes or abnormalities. Musculoskeletal: Middle toe with blistering, but no noted erythema or concern for infection. Neuro: Alert, answering all questions appropriately. Cranial nerves grossly intact. Psych: Normal affect.  LAB RESULTS:  Lab Results  Component Value Date   NA 142 03/15/2020   K 3.4 (L) 03/15/2020   CL 105 03/15/2020   CO2 26 03/15/2020   GLUCOSE 140 (H) 03/15/2020   BUN 14 03/15/2020   CREATININE 0.97 03/15/2020   CALCIUM 9.2 03/15/2020   PROT 7.5 03/15/2020   ALBUMIN 3.8 03/15/2020   AST 30 03/15/2020   ALT 27 03/15/2020   ALKPHOS 139 (H) 03/15/2020   BILITOT 0.4 03/15/2020   GFRNONAA >60 03/15/2020   GFRAA >60 03/15/2020    Lab Results  Component Value Date   WBC 8.5 03/15/2020   NEUTROABS 5.4 03/15/2020   HGB 13.3 03/15/2020   HCT 39.7 03/15/2020   MCV 69.8 (L) 03/15/2020   PLT 222 03/15/2020     STUDIES: No results found.  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  PLAN:    1. 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  Patient completed neoadjuvant chemotherapy with Adriamycin, Cytoxan, and Taxol on September 08, 2017.  She then underwent partial mastectomy on October 14, 2017.  Patient noted to have residual disease, therefore proceeded with adjuvant XRT which she completed on January 06, 2018.  Despite hot flashes, patient wishes to continue letrozole.   She will complete a minimum of 5 years of treatment in June 2024.  Patient's most recent mammogram on September 28, 2019 was reported as BI-RADS 2.  Repeat in March 2022.  Return to clinic in 6 months for routine evaluation.   2.  Hot flashes: Chronic and relatively unchanged.  Patient does not wish to switch letrozole at this time. 3.  Osteopenia: Patient's most recent bone mineral density on March 24, 2019 reported T score of -1.2 which is essentially unchanged from 1 year prior when the T score was reported -1.3.  Continue calcium and vitamin D supplementation.  Repeat in September  2021. 4.  Insomnia/anxiety: Patient does not complain of this today.  Continue Xanax as needed. 5.  Neuropathy: Patient does not complain of this today. 6.  Back pain: Chronic and unchanged.  Continue evaluation by orthopedics. 7.  Flank pain: Possible residual nerve damage from surgery and/or XRT.  Continue Tylenol as needed. 8.  Toe pain: Patient has been instructed to keep clean and use topical antibiotics.  Call primary care if erythema becomes worse. 9.  Clinical trials enrolled:  [id number] - BWEL: Randomized Phase III Trial Evaluating the Role of Weight Loss in Adjuvant Treatment of Overweight and Obese Women with Early Breast Cancer  [id number]: A randomized phase III double blinded placebo-controlled trial of aspirin as adjuvant therapy for HER2 negative breast cancer: the ABC trial  I provided 30 minutes of face-to-face video visit time during this encounter which included chart review, counseling, and coordination of care as documented above.     Patient expressed understanding and was in agreement with this plan. She also understands that She can call clinic at any time with any questions, concerns, or complaints.   Cancer Staging Primary cancer of upper outer quadrant of right female breast [facility]) Staging form: Breast, AJCC 8th Edition - Clinical stage from 03/27/2017: Stage IB (cT1c, cN1, cM0, G2, ER:  Positive, PR: Positive, HER2: Negative) - Signed by Lloyd Huger, MD on 03/27/2017 - Pathologic stage from 10/22/2017: No Stage Recommended (ypT1c, pN1a, cM0, G2, ER+, PR+, HER2-) - Signed by Lloyd Huger, MD on 10/22/2017   Lloyd Huger, MD   03/15/2020 11:43 AM

## 2020-03-14 ENCOUNTER — Encounter: Payer: Self-pay | Admitting: Oncology

## 2020-03-14 NOTE — Progress Notes (Signed)
Patient reports some pressure on right side of ribs. Patient doesn't know if that is side effect of radiation. She states she just doesn't feel like herself. Patient would like to see podiatrist, she reports made a mistake when cutting toenails and ended up cutting toe. Was wondering if provider could take picture of toe and send to podiatrist.

## 2020-03-15 ENCOUNTER — Other Ambulatory Visit: Payer: Self-pay

## 2020-03-15 ENCOUNTER — Inpatient Hospital Stay (HOSPITAL_BASED_OUTPATIENT_CLINIC_OR_DEPARTMENT_OTHER): Payer: Medicare HMO | Admitting: Oncology

## 2020-03-15 ENCOUNTER — Inpatient Hospital Stay: Payer: Medicare HMO

## 2020-03-15 VITALS — BP 123/86 | HR 110 | Temp 98.4°F | Resp 20 | Wt 257.2 lb

## 2020-03-15 DIAGNOSIS — Z006 Encounter for examination for normal comparison and control in clinical research program: Secondary | ICD-10-CM | POA: Diagnosis not present

## 2020-03-15 DIAGNOSIS — C50411 Malignant neoplasm of upper-outer quadrant of right female breast: Secondary | ICD-10-CM

## 2020-03-15 LAB — CBC WITH DIFFERENTIAL/PLATELET
Abs Immature Granulocytes: 0.06 10*3/uL (ref 0.00–0.07)
Basophils Absolute: 0.1 10*3/uL (ref 0.0–0.1)
Basophils Relative: 1 %
Eosinophils Absolute: 0.2 10*3/uL (ref 0.0–0.5)
Eosinophils Relative: 2 %
HCT: 39.7 % (ref 36.0–46.0)
Hemoglobin: 13.3 g/dL (ref 12.0–15.0)
Immature Granulocytes: 1 %
Lymphocytes Relative: 26 %
Lymphs Abs: 2.2 10*3/uL (ref 0.7–4.0)
MCH: 23.4 pg — ABNORMAL LOW (ref 26.0–34.0)
MCHC: 33.5 g/dL (ref 30.0–36.0)
MCV: 69.8 fL — ABNORMAL LOW (ref 80.0–100.0)
Monocytes Absolute: 0.6 10*3/uL (ref 0.1–1.0)
Monocytes Relative: 7 %
Neutro Abs: 5.4 10*3/uL (ref 1.7–7.7)
Neutrophils Relative %: 63 %
Platelets: 222 10*3/uL (ref 150–400)
RBC: 5.69 MIL/uL — ABNORMAL HIGH (ref 3.87–5.11)
RDW: 14.7 % (ref 11.5–15.5)
WBC: 8.5 10*3/uL (ref 4.0–10.5)
nRBC: 0 % (ref 0.0–0.2)

## 2020-03-15 LAB — COMPREHENSIVE METABOLIC PANEL
ALT: 27 U/L (ref 0–44)
AST: 30 U/L (ref 15–41)
Albumin: 3.8 g/dL (ref 3.5–5.0)
Alkaline Phosphatase: 139 U/L — ABNORMAL HIGH (ref 38–126)
Anion gap: 11 (ref 5–15)
BUN: 14 mg/dL (ref 6–20)
CO2: 26 mmol/L (ref 22–32)
Calcium: 9.2 mg/dL (ref 8.9–10.3)
Chloride: 105 mmol/L (ref 98–111)
Creatinine, Ser: 0.97 mg/dL (ref 0.44–1.00)
GFR calc Af Amer: >60 mL/min (ref >60)
GFR calc non Af Amer: >60 mL/min (ref >60)
Glucose, Bld: 140 mg/dL — ABNORMAL HIGH (ref 70–99)
Potassium: 3.4 mmol/L — ABNORMAL LOW (ref 3.5–5.1)
Sodium: 142 mmol/L (ref 135–145)
Total Bilirubin: 0.4 mg/dL (ref 0.3–1.2)
Total Protein: 7.5 g/dL (ref 6.5–8.1)

## 2020-03-15 NOTE — Research (Signed)
[id number] ABC Central Lab Visit:  Patient in to the cancer center this am for her scheduled appointment in the lab unaccompanied. She is in good spirits this morning, telling jokes and laughing. She was ambulatory today without much difficulty. The patient confirmed she had been fasting since last night for her research labs. Two pink EDTA tubes were drawn and approximately 20 ml of urine was collected for ambient shipment to the [facility]. The tubes and urine were sent in a refrigerated box with a cold pack priority first overnight by [employer] tracking number [id number]. The shipping manifest was included in the shipment. The patient did not have any difficulty with this visit. Research  RN thanked her for coming in today to collect the labs. Approximately 30 minutes was spent with this patient to acquire research labs.  Jeral Fruit, RN, BSN, OCN 03/15/2020 1145 am

## 2020-04-03 ENCOUNTER — Other Ambulatory Visit: Payer: Self-pay

## 2020-04-03 ENCOUNTER — Ambulatory Visit
Admission: RE | Admit: 2020-04-03 | Discharge: 2020-04-03 | Disposition: A | Discharge status: Home / Self Care | Payer: Medicare HMO | Source: Ambulatory Visit | Attending: Oncology | Admitting: Oncology

## 2020-04-03 DIAGNOSIS — Z923 Personal history of irradiation: Secondary | ICD-10-CM | POA: Insufficient documentation

## 2020-04-03 DIAGNOSIS — Z13820 Encounter for screening for osteoporosis: Secondary | ICD-10-CM | POA: Diagnosis not present

## 2020-04-03 DIAGNOSIS — C50411 Malignant neoplasm of upper-outer quadrant of right female breast: Secondary | ICD-10-CM

## 2020-04-03 DIAGNOSIS — Z780 Asymptomatic menopausal state: Secondary | ICD-10-CM | POA: Insufficient documentation

## 2020-04-03 DIAGNOSIS — Z853 Personal history of malignant neoplasm of breast: Secondary | ICD-10-CM | POA: Diagnosis not present

## 2020-04-03 DIAGNOSIS — Z9221 Personal history of antineoplastic chemotherapy: Secondary | ICD-10-CM | POA: Diagnosis not present

## 2020-04-19 ENCOUNTER — Inpatient Hospital Stay: Payer: Medicare HMO

## 2020-04-30 ENCOUNTER — Inpatient Hospital Stay: Payer: Medicare HMO

## 2020-04-30 ENCOUNTER — Inpatient Hospital Stay: Payer: Medicare HMO | Attending: Oncology

## 2020-04-30 ENCOUNTER — Other Ambulatory Visit: Payer: Self-pay

## 2020-04-30 DIAGNOSIS — D500 Iron deficiency anemia secondary to blood loss (chronic): Secondary | ICD-10-CM

## 2020-04-30 DIAGNOSIS — Z452 Encounter for adjustment and management of vascular access device: Secondary | ICD-10-CM | POA: Insufficient documentation

## 2020-04-30 DIAGNOSIS — Z95828 Presence of other vascular implants and grafts: Secondary | ICD-10-CM

## 2020-04-30 DIAGNOSIS — C50411 Malignant neoplasm of upper-outer quadrant of right female breast: Secondary | ICD-10-CM | POA: Diagnosis present

## 2020-04-30 MED ORDER — HEPARIN SOD (PORK) LOCK FLUSH 100 UNIT/ML IV SOLN
500.0000 [IU] | Freq: Once | INTRAVENOUS | Status: AC
  Administered 2020-04-30: 500 [IU] via INTRAVENOUS
  Filled 2020-04-30: qty 5

## 2020-04-30 MED ORDER — SODIUM CHLORIDE 0.9% FLUSH
10.0000 mL | Freq: Once | INTRAVENOUS | Status: AC
  Administered 2020-04-30: 10 mL via INTRAVENOUS
  Filled 2020-04-30: qty 10

## 2020-04-30 MED ORDER — HEPARIN SOD (PORK) LOCK FLUSH 100 UNIT/ML IV SOLN
INTRAVENOUS | Status: AC
  Filled 2020-04-30: qty 5

## 2020-06-07 ENCOUNTER — Inpatient Hospital Stay: Payer: Medicare HMO

## 2020-06-07 ENCOUNTER — Inpatient Hospital Stay: Payer: Medicare HMO | Attending: Oncology

## 2020-06-07 DIAGNOSIS — C50411 Malignant neoplasm of upper-outer quadrant of right female breast: Secondary | ICD-10-CM | POA: Diagnosis not present

## 2020-06-07 DIAGNOSIS — Z23 Encounter for immunization: Secondary | ICD-10-CM | POA: Diagnosis not present

## 2020-06-07 DIAGNOSIS — Z95828 Presence of other vascular implants and grafts: Secondary | ICD-10-CM

## 2020-06-07 DIAGNOSIS — Z452 Encounter for adjustment and management of vascular access device: Secondary | ICD-10-CM | POA: Diagnosis not present

## 2020-06-07 MED ORDER — HEPARIN SOD (PORK) LOCK FLUSH 100 UNIT/ML IV SOLN
INTRAVENOUS | Status: AC
  Filled 2020-06-07: qty 5

## 2020-06-07 MED ORDER — SODIUM CHLORIDE 0.9% FLUSH
10.0000 mL | INTRAVENOUS | Status: DC | PRN
  Administered 2020-06-07: 10 mL via INTRAVENOUS
  Filled 2020-06-07: qty 10

## 2020-06-07 MED ORDER — HEPARIN SOD (PORK) LOCK FLUSH 100 UNIT/ML IV SOLN
500.0000 [IU] | Freq: Once | INTRAVENOUS | Status: AC
  Administered 2020-06-07: 500 [IU] via INTRAVENOUS
  Filled 2020-06-07: qty 5

## 2020-06-07 MED ORDER — INFLUENZA VAC SPLIT QUAD 0.5 ML IM SUSY
0.5000 mL | PREFILLED_SYRINGE | Freq: Once | INTRAMUSCULAR | Status: AC
  Administered 2020-06-07: 0.5 mL via INTRAMUSCULAR
  Filled 2020-06-07: qty 0.5

## 2020-06-20 ENCOUNTER — Telehealth: Payer: Self-pay

## 2020-06-20 DIAGNOSIS — C50411 Malignant neoplasm of upper-outer quadrant of right female breast: Secondary | ICD-10-CM

## 2020-06-20 NOTE — Telephone Encounter (Cosign Needed)
[id number] "ABC" Study Closure:  Research RN spoke with the patient via the telephone today and informed her of the A011502 "ABC" closure. The patient was informed that the experts found that the participants treated with aspirin did not do better than participants taking the placebo with regard to the rate of their cancer coming back after a recent analysis performed by experts not involved in the study. Because of this result, it is recommended that the study be stopped, so you should stop taking your study drug at this time. When the physician and research team are notified of the results from unblinding all patients on the study, we will let you know what you were taken. This process could take a few months for notification. The patient was advised to continue any current cancer medications and continue follow up with her doctor. The study will continue to monitor all patients at their specified schedule for visits until further notice at this point. Any leftover drug the patient has should be returned as well as the medication logs at this time. The patient states she will return the drug and diaries this month when she has her port flush visit.  Jeral Fruit 06/20/20 11:45 AM

## 2020-07-25 ENCOUNTER — Telehealth: Payer: Self-pay | Admitting: Family Medicine

## 2020-07-26 ENCOUNTER — Inpatient Hospital Stay: Payer: Medicare HMO

## 2020-07-26 ENCOUNTER — Inpatient Hospital Stay: Payer: Medicare HMO | Attending: Oncology

## 2020-07-26 ENCOUNTER — Other Ambulatory Visit: Payer: Self-pay

## 2020-07-26 DIAGNOSIS — Z452 Encounter for adjustment and management of vascular access device: Secondary | ICD-10-CM | POA: Diagnosis not present

## 2020-07-26 DIAGNOSIS — Z95828 Presence of other vascular implants and grafts: Secondary | ICD-10-CM

## 2020-07-26 DIAGNOSIS — C50411 Malignant neoplasm of upper-outer quadrant of right female breast: Secondary | ICD-10-CM | POA: Diagnosis present

## 2020-07-26 MED ORDER — HEPARIN SOD (PORK) LOCK FLUSH 100 UNIT/ML IV SOLN
INTRAVENOUS | Status: AC
  Filled 2020-07-26: qty 5

## 2020-07-26 MED ORDER — SODIUM CHLORIDE 0.9% FLUSH
10.0000 mL | INTRAVENOUS | Status: DC | PRN
  Administered 2020-07-26: 10 mL via INTRAVENOUS
  Filled 2020-07-26: qty 10

## 2020-07-26 MED ORDER — HEPARIN SOD (PORK) LOCK FLUSH 100 UNIT/ML IV SOLN
500.0000 [IU] | Freq: Once | INTRAVENOUS | Status: AC
  Administered 2020-07-26: 500 [IU] via INTRAVENOUS
  Filled 2020-07-26: qty 5

## 2020-07-31 ENCOUNTER — Encounter: Payer: Self-pay | Admitting: *Deleted

## 2020-09-13 ENCOUNTER — Inpatient Hospital Stay: Payer: Medicare HMO

## 2020-09-22 NOTE — Progress Notes (Signed)
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  INTERVAL HISTORY: Patient returns to clinic today for routine 57-monthevaluation.  She continues to have hot flashes with letrozole, but otherwise feels well. She has no neurologic complaints.  She does not complain of weakness or fatigue today.  She has a good appetite and denies weight loss.  She denies any chest pain, shortness of breath, cough, or hemoptysis.  She denies any nausea, vomiting, constipation, or diarrhea. She has no urinary complaints.  Patient offers no further specific complaints today.  REVIEW OF SYSTEMS:   Review of Systems  Constitutional: Negative.  Negative for fever, malaise/fatigue and weight loss.  Respiratory: Negative.  Negative for cough and shortness of breath.   Cardiovascular: Negative.  Negative for chest pain and leg swelling.  Gastrointestinal: Negative.  Negative for abdominal pain.  Genitourinary: Negative.  Negative for dysuria and flank pain.  Musculoskeletal: Negative.  Negative for back pain and joint pain.  Skin: Negative.  Negative for itching and rash.  Neurological: Positive for sensory change. Negative for tingling, focal weakness and weakness.  Psychiatric/Behavioral: Negative.  The patient is not nervous/anxious and does not have insomnia.     As per HPI. Otherwise, a complete review of systems is  negative.  PAST MEDICAL HISTORY: Past Medical History:  Diagnosis Date   Anemia    Arthritis    Breast cancer ([facility] 2018   Right breast cancer- [facility]   Dyspnea    SINCE STARTING CHEMO WITH EXERTION   GERD (gastroesophageal reflux disease)    NO MEDS   Heart murmur    DX IN TEENS-ASYMPTOMATIC   Hypertension    Migraines    H/O MIGRAINES   Personal history of chemotherapy 2018   finished 09/08/17   Personal history of radiation therapy    Rt. breast   Thumb fracture    LEFT-WEARING BRACE    PAST SURGICAL HISTORY: Past Surgical History:  Procedure Laterality Date   ABLATION  2006   BREAST BIOPSY Right 03/19/2017    ISan Migueland mets in lymph node   BREAST LUMPECTOMY Right 10/14/2017   CESAREAN SECTION     FRACTURE SURGERY Left 2013   left hand, thumb (screws and titanium)   INCISION AND DRAINAGE ABSCESS Right 04/26/2018   Procedure: INCISION AND DRAINAGE ABSCESS;  Surgeon: CHerbert Pun MD;  Location: [facility];  Service: General;  Laterality: Right;   PARTIAL MASTECTOMY WITH NEEDLE LOCALIZATION Right 10/14/2017   Procedure: PARTIAL MASTECTOMY WITH NEEDLE LOCALIZATION;  Surgeon: CHerbert Pun MD;  Location: [facility];  Service: General;  Laterality: Right;   PILONIDAL CYST EXCISION  1980'S   PORT-A-CATH REMOVAL N/A 04/27/2017   Procedure: REMOVAL OF PORT A CATH & insertion PORT A CATH LEFT INTERNAL JUGULAR;  Surgeon: CHerbert Pun MD;  Location: [facility];  Service: General;  Laterality: N/A;   PORTACATH PLACEMENT N/A 04/15/2017   Procedure: INSERTION PORT-A-CATH;  Surgeon: CHerbert Pun MD;  Location: [facility];  Service: General;  Laterality: N/A;   SENTINEL NODE BIOPSY Right 10/14/2017   Procedure: SENTINEL NODE BIOPSY;  Surgeon: Herbert Pun, MD;  Location: [facility];  Service: General;  Laterality: Right;    FAMILY HISTORY: Family History  Problem Relation Age of Onset   Breast cancer Maternal Grandmother     Hypertension Maternal Grandmother    Angina Mother    Lung cancer Father    Diabetes Father    Prostate cancer Maternal Uncle    Dementia Maternal Grandfather    Congestive Heart Failure Maternal Grandfather    Heart disease Paternal Grandmother     ADVANCED DIRECTIVES (Y/N):  N  HEALTH MAINTENANCE: Social History   Tobacco Use   Smoking status: Former Smoker    Packs/day: 2.00    Years: 18.00    Pack years: 36.00    Types: Cigarettes    Quit date: 05/01/2017    Years since quitting: 3.4   Smokeless tobacco: Never Used   Tobacco comment: quit 4 months ago  Vaping Use   Vaping Use: Never used  Substance Use Topics   Alcohol use: No   Drug use: No     Colonoscopy:  PAP:  Bone density:  Lipid panel:  Allergies  Allergen Reactions   Hydrocodone-Acetaminophen Itching and Rash   Penicillins Swelling, Other (See Comments) and Rash    Throat swelling Has patient had a PCN reaction causing immediate rash, facial/tongue/throat swelling, SOB or lightheadedness with hypotension: Yes Has patient had a PCN reaction causing severe rash involving mucus membranes or skin necrosis: No Has patient had a PCN reaction that required hospitalization: No Has patient had a PCN reaction occurring within the last 10 years: No If all of the above answers are "NO", then may proceed with Cephalosporin use.     Current Outpatient Medications  Medication Sig Dispense Refill   acetaminophen (TYLENOL) 500 MG tablet Take 1,000 mg by mouth every 8 (eight) hours as needed for mild pain.     ALPRAZolam (XANAX) 0.25 MG tablet Take 1 tablet (0.25 mg total) by mouth at bedtime as needed for anxiety. 30 tablet 1   Calcium-Magnesium-Zinc (CAL-MAG-ZINC PO) Take 1 tablet by mouth daily.     diclofenac sodium (VOLTAREN) 1 % GEL Apply 2 g topically 4 (four) times daily as needed (for pain in right thumb).     fluticasone (FLONASE) 50 MCG/ACT nasal spray SPRAY 2 SPRAYS INTO EACH NOSTRIL  EVERY DAY  2   gabapentin (NEURONTIN) 300 MG capsule TAKE 1 CAPSULE BY MOUTH THREE TIMES A DAY 270 capsule 1   hydrochlorothiazide (HYDRODIURIL) 12.5 MG tablet Take 1 tablet by mouth as needed. 1 tablet daily     ibuprofen (ADVIL,MOTRIN) 600 MG tablet   0   Investigational aspirin/placebo 300 MG tablet Alliance [id number] Take 1 tablet by mouth daily. Take with food or a full glass of water.  Do not crush enteric coated tablets. (Patient taking differently: Take 1 tablet by mouth daily. Take with food or a full glass of water.  Do not crush enteric coated tablets.) 180 tablet 0   Investigational aspirin/placebo 300 MG tablet Alliance [id number] Take 1 tablet by mouth daily. Take with food or a full glass of water.  Do not crush enteric coated tablets. 180 tablet 0   loratadine (CLARITIN) 10 MG tablet Take 10 mg by mouth daily as needed for allergies.     Multiple Vitamins-Minerals (ADULT GUMMY PO) Take 2 tablets by mouth daily. One-A-Day Women's Multivitamin Gummies  bisoprolol-hydrochlorothiazide (ZIAC) 10-6.25 MG tablet Take 1 tablet by mouth daily.     letrozole (FEMARA) 2.5 MG tablet Take 1 tablet (2.5 mg total) by mouth daily. 90 tablet 3   lidocaine-prilocaine (EMLA) cream Apply 1 application topically as needed. 30 g 3   No current facility-administered medications for this visit.    OBJECTIVE: Vitals:   09/25/20 1105  BP: 108/85  Pulse: (!) 111  Resp: 20  Temp: 98.4 F (36.9 C)  SpO2: 98%     Body mass index is 42.99 kg/m.    ECOG FS:0 - Asymptomatic  General: Well-developed, well-nourished, no acute distress. Eyes: Pink conjunctiva, anicteric sclera. HEENT: Normocephalic, moist mucous membranes. Lungs: No audible wheezing or coughing. Heart: Regular rate and rhythm. Abdomen: Soft, nontender, no obvious distention. Musculoskeletal: No edema, cyanosis, or clubbing. Neuro: Alert, answering all questions appropriately. Cranial nerves grossly intact. Skin: No rashes or  petechiae noted. Psych: Normal affect.  LAB RESULTS:  Lab Results  Component Value Date   NA 142 09/25/2020   K 3.6 09/25/2020   CL 104 09/25/2020   CO2 27 09/25/2020   GLUCOSE 117 (H) 09/25/2020   BUN 14 09/25/2020   CREATININE 0.91 09/25/2020   CALCIUM 9.2 09/25/2020   PROT 7.8 09/25/2020   ALBUMIN 3.7 09/25/2020   AST 25 09/25/2020   ALT 23 09/25/2020   ALKPHOS 129 (H) 09/25/2020   BILITOT 0.5 09/25/2020   GFRNONAA >60 09/25/2020   GFRAA >60 03/15/2020    Lab Results  Component Value Date   WBC 8.8 09/25/2020   NEUTROABS 5.6 09/25/2020   HGB 13.3 09/25/2020   HCT 41.7 09/25/2020   MCV 72.0 (L) 09/25/2020   PLT 239 09/25/2020     STUDIES: No results found.  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  PLAN:    1. 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  Patient completed neoadjuvant chemotherapy with Adriamycin, Cytoxan, and Taxol on September 08, 2017.  She then underwent partial mastectomy on October 14, 2017.  Patient noted to have residual disease, therefore proceeded with adjuvant XRT which she completed on January 06, 2018.  Despite hot flashes, patient wishes to continue letrozole.  She will complete a minimum of 5 years of treatment in June 2024.  Patient's most recent mammogram on September 28, 2019 was reported as BI-RADS 2.  She will require mammogram in the next 1 to 2 weeks.  Per clinical trial guidelines, patient will return to clinic in 3 months for further evaluation.   2.  Hot flashes: Chronic and unchanged.  Patient does not wish to switch letrozole at this time. 3.  Osteopenia: Patient's most recent bone mineral density on March 24, 2019 reported T score of -1.2 which is essentially unchanged from 1 year prior when  the T score was reported -1.3.  Continue calcium and vitamin D supplementation.  Repeat bone mineral density in September 2022. 4.  Insomnia/anxiety: Patient does not complain of this today.  Continue Xanax as needed. 5.  Neuropathy: Patient does not complain of this today. 6.  Back pain: Chronic and unchanged. 7.  Flank pain: Patient does not complain of this today. 8.  Toe pain: Resolved. 9.  Clinical trials enrolled:  [id number] - BWEL: Randomized Phase III Trial Evaluating the Role of Weight Loss  in Adjuvant Treatment of Overweight and Obese Women with Early Breast Cancer  [phone]: A randomized phase III double blinded placebo-controlled trial of aspirin as adjuvant therapy for HER2 negative breast cancer: the ABC trial    Patient expressed understanding and was in agreement with this plan. She also understands that She can call clinic at any time with any questions, concerns, or complaints.   Cancer Staging Primary cancer of upper outer quadrant of right female breast [facility]) Staging form: Breast, AJCC 8th Edition - Clinical stage from 03/27/2017: Stage IB (cT1c, cN1, cM0, G2, ER: Positive, PR: Positive, HER2: Negative) - Signed by Lloyd Huger, MD on 03/27/2017 Histologic grading system: 3 grade system Laterality: Right - Pathologic stage from 10/22/2017: No Stage Recommended (ypT1c, pN1a, cM0, G2, ER+, PR+, HER2-) - Signed by Lloyd Huger, MD on 10/22/2017 Stage prefix: Post-therapy Neoadjuvant therapy: Yes Histologic grading system: 3 grade system Laterality: Right   Lloyd Huger, MD   09/26/2020 5:59 AM

## 2020-09-24 ENCOUNTER — Telehealth: Payer: Self-pay

## 2020-09-24 DIAGNOSIS — C50411 Malignant neoplasm of upper-outer quadrant of right female breast: Secondary | ICD-10-CM

## 2020-09-24 NOTE — Telephone Encounter (Signed)
Research nurse called the patient this morning to remind her of her appointment time and to bring in her pill bottles and diaries for the [id number] "ABC" protocol for return to the pharmacy as the study has closed.  Jeral Fruit, RN 09/24/20 11:48 AM

## 2020-09-25 ENCOUNTER — Other Ambulatory Visit: Payer: Self-pay

## 2020-09-25 ENCOUNTER — Inpatient Hospital Stay: Payer: Medicare HMO

## 2020-09-25 ENCOUNTER — Inpatient Hospital Stay (HOSPITAL_BASED_OUTPATIENT_CLINIC_OR_DEPARTMENT_OTHER): Payer: Medicare HMO | Admitting: Oncology

## 2020-09-25 ENCOUNTER — Inpatient Hospital Stay: Payer: Medicare HMO | Attending: Oncology

## 2020-09-25 ENCOUNTER — Encounter: Payer: Self-pay | Admitting: Oncology

## 2020-09-25 VITALS — BP 108/85 | HR 111 | Temp 98.4°F | Resp 20 | Wt 254.4 lb

## 2020-09-25 DIAGNOSIS — Z006 Encounter for examination for normal comparison and control in clinical research program: Secondary | ICD-10-CM | POA: Insufficient documentation

## 2020-09-25 DIAGNOSIS — F419 Anxiety disorder, unspecified: Secondary | ICD-10-CM

## 2020-09-25 DIAGNOSIS — G4700 Insomnia, unspecified: Secondary | ICD-10-CM | POA: Insufficient documentation

## 2020-09-25 DIAGNOSIS — R232 Flushing: Secondary | ICD-10-CM

## 2020-09-25 DIAGNOSIS — M549 Dorsalgia, unspecified: Secondary | ICD-10-CM | POA: Insufficient documentation

## 2020-09-25 DIAGNOSIS — Z9221 Personal history of antineoplastic chemotherapy: Secondary | ICD-10-CM | POA: Diagnosis not present

## 2020-09-25 DIAGNOSIS — G8929 Other chronic pain: Secondary | ICD-10-CM

## 2020-09-25 DIAGNOSIS — Z170 Estrogen receptor positive status [ER+]: Secondary | ICD-10-CM | POA: Diagnosis not present

## 2020-09-25 DIAGNOSIS — Z79811 Long term (current) use of aromatase inhibitors: Secondary | ICD-10-CM

## 2020-09-25 DIAGNOSIS — C50411 Malignant neoplasm of upper-outer quadrant of right female breast: Secondary | ICD-10-CM | POA: Diagnosis present

## 2020-09-25 DIAGNOSIS — Z923 Personal history of irradiation: Secondary | ICD-10-CM

## 2020-09-25 DIAGNOSIS — Z87891 Personal history of nicotine dependence: Secondary | ICD-10-CM | POA: Diagnosis not present

## 2020-09-25 DIAGNOSIS — Z95828 Presence of other vascular implants and grafts: Secondary | ICD-10-CM

## 2020-09-25 DIAGNOSIS — M8580 Other specified disorders of bone density and structure, unspecified site: Secondary | ICD-10-CM | POA: Insufficient documentation

## 2020-09-25 LAB — CBC WITH DIFFERENTIAL/PLATELET
Abs Immature Granulocytes: 0.08 10*3/uL — ABNORMAL HIGH (ref 0.00–0.07)
Basophils Absolute: 0.1 10*3/uL (ref 0.0–0.1)
Basophils Relative: 1 %
Eosinophils Absolute: 0.2 10*3/uL (ref 0.0–0.5)
Eosinophils Relative: 2 %
HCT: 41.7 % (ref 36.0–46.0)
Hemoglobin: 13.3 g/dL (ref 12.0–15.0)
Immature Granulocytes: 1 %
Lymphocytes Relative: 24 %
Lymphs Abs: 2.1 10*3/uL (ref 0.7–4.0)
MCH: 23 pg — ABNORMAL LOW (ref 26.0–34.0)
MCHC: 31.9 g/dL (ref 30.0–36.0)
MCV: 72 fL — ABNORMAL LOW (ref 80.0–100.0)
Monocytes Absolute: 0.9 10*3/uL (ref 0.1–1.0)
Monocytes Relative: 10 %
Neutro Abs: 5.6 10*3/uL (ref 1.7–7.7)
Neutrophils Relative %: 62 %
Platelets: 239 10*3/uL (ref 150–400)
RBC: 5.79 MIL/uL — ABNORMAL HIGH (ref 3.87–5.11)
RDW: 14.6 % (ref 11.5–15.5)
WBC: 8.8 10*3/uL (ref 4.0–10.5)
nRBC: 0 % (ref 0.0–0.2)

## 2020-09-25 LAB — COMPREHENSIVE METABOLIC PANEL
ALT: 23 U/L (ref 0–44)
AST: 25 U/L (ref 15–41)
Albumin: 3.7 g/dL (ref 3.5–5.0)
Alkaline Phosphatase: 129 U/L — ABNORMAL HIGH (ref 38–126)
Anion gap: 11 (ref 5–15)
BUN: 14 mg/dL (ref 6–20)
CO2: 27 mmol/L (ref 22–32)
Calcium: 9.2 mg/dL (ref 8.9–10.3)
Chloride: 104 mmol/L (ref 98–111)
Creatinine, Ser: 0.91 mg/dL (ref 0.44–1.00)
GFR, Estimated: >60 mL/min (ref >60)
Glucose, Bld: 117 mg/dL — ABNORMAL HIGH (ref 70–99)
Potassium: 3.6 mmol/L (ref 3.5–5.1)
Sodium: 142 mmol/L (ref 135–145)
Total Bilirubin: 0.5 mg/dL (ref 0.3–1.2)
Total Protein: 7.8 g/dL (ref 6.5–8.1)

## 2020-09-25 MED ORDER — HEPARIN SOD (PORK) LOCK FLUSH 100 UNIT/ML IV SOLN
500.0000 [IU] | Freq: Once | INTRAVENOUS | Status: AC
  Administered 2020-09-25: 500 [IU] via INTRAVENOUS
  Filled 2020-09-25: qty 5

## 2020-09-25 MED ORDER — LETROZOLE 2.5 MG PO TABS: 2.5000 mg | ORAL_TABLET | Freq: Every day | ORAL | 3 refills | Status: DC

## 2020-09-25 MED ORDER — LIDOCAINE-PRILOCAINE 2.5-2.5 % EX CREA: 1.0000 "application " | TOPICAL_CREAM | CUTANEOUS | 3 refills | Status: DC | PRN

## 2020-09-25 MED ORDER — SODIUM CHLORIDE 0.9% FLUSH
10.0000 mL | Freq: Once | INTRAVENOUS | Status: AC
  Administered 2020-09-25: 10 mL via INTRAVENOUS
  Filled 2020-09-25: qty 10

## 2020-09-25 NOTE — Research (Signed)
Destiny Rogers presented to clinic this morning to see Dr. Grayland Ormond and be evaluated for her 30 month study visit on the [id number] BWEL study.  Reviewed solicited AE's for 30 month time point, and patient denies experiencing any of these. States she continues to experience back pain and numbness in her feet and hands, which no longer affects her activity. [name] completed the 30 month BWEL study questionnaire while in clinic. Patient's weight and waist & hip measurements were checked twice per protocol with patient removing shoes and wearing light clothing. Weight was 115.4 kg and 115.5 kg after having patient step off scales, then resetting for second weight check. Waist measured 121.9 cm and 119.4 cm respectively at narrowest section between the lowest rib and the iliac crest and hips measured 139.7 cm and 139.7 cm respectively at maximum circumference while patient standing straight up with feet approximately 10cm apart. Patient reports having had multiple dental appointments over the past few months because she could not have dental work while on active chemotherapy, and states she has a new partial that the dentist "messed up". She states she can't eat with it in and if she isn't careful she will choke sometimes. Reports she has not been eating as well in the past few weeks for this reason. She asked Dr. Grayland Ormond if she could start a weight loss program for which he agreed. Bilateral mammogram will be scheduled in the next couple of weeks. Patient is interested in having her port-a-cath removed and Dr. Grayland Ormond has agreed to refer her. The patients research schedule was reviewed with her and Dr. Grayland Ormond, she is aware that she will return in 6 months for her next BWEL study visit.  Solicited and other AE's with grade and attribution were assessed as noted below. Approximately 2 hours was spent with the patient today for completion of all research related activities.   Study/Protocol: [id number] BWEL Cycle: 30  month Event Grade Onset Date Resolved Date Drug Name Aspirin/placebo Attribution Treatment Comments  Fractures 0       Solicited AE  Sprains 0      Solicited AE  Tendon or ligament injury 0        Solicited AE  Orthopedic surgery 0        Solicited AE    *OTHER AE's*           Pedal edema 1 unknown   unrelated  none States this has stayed the same  Hot flashes 1 unknown   Letrozole  none   Arthralgias 1 unknown 09/25/2020  Letrozole  none Pain in hands  Back pain 2 01/05/2019   unrelated    Neuropathy in BLE and hands 1 01/05/2019   unrelated gabapentin    Jeral Fruit, RN 09/25/20 1:31 PM

## 2020-09-25 NOTE — Research (Addendum)
Destiny Rogers presented to clinic this morning as scheduled for her 30 month visit on the A011502 ABC study.  Patient returned her medication diaries and a  bottle of Aspirin/placebo 300 mg tablets with rx # [id number] and  expiration date of 03/2022. There were 99 pills in the IP bottle returned by the patient out of 200 / bottle. The IP was counted x 2 by this research nurse and Mauricio Po, [place] for verification, taken to the pharmacy and given to Sonia Baller, PharmD for destruction per protocol. Per her medication calendars, patient did not miss any doses of Aspirin/placebo since her last clinic visit, which means her compliance rate would be 100%. Patient was informed that she was receiving Aspirin per the protocol , Dr. Grayland Ormond aware of the unblinded results.  She denies using any other aspirin products or NSAID's this reporting period. Solicited and other adverse events were assessed as below. Mammogram will be scheduled, as it is due currently,in the next couple of weeks. Weight today was 254.4, with BMI of 42.3 and blood pressure 108/85, pulse of 111. Does state that she continues to have back pain due to disc issues. Dr. Grayland Ormond discussed port removal with the patient, she is agreeable to have her port removed. States she continues to have hot flashes but they are bearable with the letrozole. She will continue to take the medication as long as needed. The patient does complain of peripheral neuropathy in her fingers and her feet, denies any difficulty performing any of her ADL's currently.The patients research calendar was reviewed with her and Dr. Grayland Ormond, she is aware that she will return to the clinic in 3 months for a 6 month end of treatment visit for the protocol and then be followed annually afterwards. The patient is in agreement with the schedule.   Solicited and other adverse events as listed below with grade and attribution.  Study/Protocol: [id number] - ABC  Cycle: 30 month Event Grade Onset Date  Resolved Date Drug Name Aspirin/placebo Attribution Treatment Comments  Gastrointestinal Bleeding 0        Intracranial Hemorrhage 0        Epistaxis 0        Hematuria 0        Dyspepsia 0          Gastritis 0        Bruising 1 08/11/202102/18/2020 09/25/2020  09/16/2019   possible  States no more than her usual, she has always bruised.   Pedal edema 1 unknown   unrelated  none States this has worsened  Hot flashes 1 unknown   Letrozole    Arthralgias 1 unknown 09/25/2020  Letrozole  Pain in hands  Back pain 2 01/05/2019   unrelated    Neuropathy in BLE and hands 1 01/05/2019   unrelated gabapentin    Jeral Fruit, RN 09/25/20 12:05 PM

## 2020-09-27 ENCOUNTER — Telehealth: Payer: Self-pay

## 2020-10-17 ENCOUNTER — Ambulatory Visit
Admission: RE | Admit: 2020-10-17 | Discharge: 2020-10-17 | Disposition: A | Discharge status: Home / Self Care | Payer: Medicare HMO | Source: Ambulatory Visit | Attending: Oncology | Admitting: Oncology

## 2020-10-17 ENCOUNTER — Other Ambulatory Visit: Payer: Self-pay

## 2020-10-17 DIAGNOSIS — Z853 Personal history of malignant neoplasm of breast: Secondary | ICD-10-CM | POA: Insufficient documentation

## 2020-10-17 DIAGNOSIS — Z1231 Encounter for screening mammogram for malignant neoplasm of breast: Secondary | ICD-10-CM | POA: Diagnosis not present

## 2020-10-17 DIAGNOSIS — C50411 Malignant neoplasm of upper-outer quadrant of right female breast: Secondary | ICD-10-CM

## 2020-11-13 ENCOUNTER — Inpatient Hospital Stay: Payer: Medicare HMO

## 2020-12-28 NOTE — Progress Notes (Signed)
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  INTERVAL HISTORY: Patient returns to clinic today for routine 6-month evaluation.  She continues to have periodic hot flashes from letrozole, but states these are tolerable and do not affect her day-to-day activity.  She otherwise feels well.  She has no neurologic complaints.  She does not complain of weakness or fatigue today.  She has a good appetite and denies weight loss.  She denies any chest pain, shortness of breath, cough, or hemoptysis.  She denies any nausea, vomiting, constipation, or diarrhea. She has no urinary complaints.  Patient offers no further specific complaints today.  REVIEW OF SYSTEMS:   Review of Systems  Constitutional: Negative.  Negative for fever, malaise/fatigue and weight loss.  Respiratory: Negative.  Negative for cough and shortness of breath.   Cardiovascular: Negative.  Negative for chest pain and leg swelling.  Gastrointestinal: Negative.  Negative for abdominal pain.  Genitourinary: Negative.  Negative for dysuria and flank pain.  Musculoskeletal: Negative.  Negative for back pain and joint pain.  Skin: Negative.  Negative for itching and rash.  Neurological:  Positive for sensory change. Negative for tingling, focal weakness and weakness.  Psychiatric/Behavioral: Negative.  The patient is not nervous/anxious and  does not have insomnia.    As per HPI. Otherwise, a complete review of systems is negative.  PAST MEDICAL HISTORY: Past Medical History:  Diagnosis Date   Anemia    Arthritis    Breast cancer (HCC) 2018   Right breast cancer- [facility]   Dyspnea    SINCE STARTING CHEMO WITH EXERTION   GERD (gastroesophageal reflux disease)    NO MEDS   Heart murmur    DX IN TEENS-ASYMPTOMATIC   Hypertension    Migraines    H/O MIGRAINES   Personal history of chemotherapy 2018   finished 09/08/17   Personal history of radiation therapy    Rt. breast   Thumb fracture    LEFT-WEARING BRACE    PAST SURGICAL HISTORY: Past Surgical History:  Procedure Laterality Date   ABLATION  2006   BREAST BIOPSY Right 03/19/2017    [facility] and [facility]s in lymph node   BREAST LUMPECTOMY Right 10/14/2017   breast ca rad and chemo    CESAREAN SECTION     FRACTURE SURGERY Left 2013   left hand, thumb (screws and titanium)   INCISION AND DRAINAGE ABSCESS Right 04/26/2018   Procedure: INCISION AND DRAINAGE ABSCESS;  Surgeon: Cintron-Diaz, Edgardo, MD;  Location: [facility];  Service: General;  Laterality: Right;   PARTIAL MASTECTOMY WITH NEEDLE LOCALIZATION Right 10/14/2017   Procedure: PARTIAL MASTECTOMY WITH NEEDLE LOCALIZATION;  Surgeon: Cintron-Diaz, Edgardo, MD;  Location: [facility];  Service: General;  Laterality: Right;   PILONIDAL CYST EXCISION  1980'S   PORT-A-CATH REMOVAL N/A 04/27/2017   Procedure: REMOVAL OF PORT A CATH & insertion PORT A CATH LEFT INTERNAL JUGULAR;  Surgeon: Cintron-Diaz, Edgardo, MD;  Location: [facility];  Service: General;  Laterality: N/A;   PORTACATH   PLACEMENT N/A 04/15/2017   Procedure: INSERTION PORT-A-CATH;  Surgeon: Cintron-Diaz, Edgardo, MD;  Location: [facility];  Service: General;  Laterality: N/A;   SENTINEL NODE BIOPSY Right 10/14/2017   Procedure: SENTINEL NODE BIOPSY;  Surgeon: Cintron-Diaz, Edgardo, MD;  Location: [facility];  Service: General;  Laterality: Right;    FAMILY  HISTORY: Family History  Problem Relation Age of Onset   Breast cancer Maternal Grandmother    Hypertension Maternal Grandmother    Angina Mother    Lung cancer Father    Diabetes Father    Prostate cancer Maternal Uncle    Dementia Maternal Grandfather    Congestive Heart Failure Maternal Grandfather    Heart disease Paternal Grandmother     ADVANCED DIRECTIVES (Y/N):  N  HEALTH MAINTENANCE: Social History   Tobacco Use   Smoking status: Former    Packs/day: 2.00    Years: 18.00    Pack years: 36.00    Types: Cigarettes    Quit date: 05/01/2017    Years since quitting: 3.6   Smokeless tobacco: Never   Tobacco comments:    quit 4 months ago  Vaping Use   Vaping Use: Never used  Substance Use Topics   Alcohol use: No   Drug use: No     Colonoscopy:  PAP:  Bone density:  Lipid panel:  Allergies  Allergen Reactions   Hydrocodone-Acetaminophen Itching and Rash   Penicillins Swelling, Other (See Comments) and Rash    Throat swelling Has patient had a PCN reaction causing immediate rash, facial/tongue/throat swelling, SOB or lightheadedness with hypotension: Yes Has patient had a PCN reaction causing severe rash involving mucus membranes or skin necrosis: No Has patient had a PCN reaction that required hospitalization: No Has patient had a PCN reaction occurring within the last 10 years: No If all of the above answers are "NO", then may proceed with Cephalosporin use.     Current Outpatient Medications  Medication Sig Dispense Refill   acetaminophen (TYLENOL) 500 MG tablet Take 1,000 mg by mouth every 8 (eight) hours as needed for mild pain.     bisoprolol-hydrochlorothiazide (ZIAC) 10-6.25 MG tablet Take 1 tablet by mouth daily.     hydrochlorothiazide (HYDRODIURIL) 12.5 MG tablet Take 1 tablet by mouth as needed. 1 tablet daily     ibuprofen (ADVIL,MOTRIN) 600 MG tablet   0   letrozole (FEMARA) 2.5 MG tablet Take 1 tablet (2.5 mg total) by mouth daily. 90  tablet 3   loratadine (CLARITIN) 10 MG tablet Take 10 mg by mouth daily as needed for allergies.     Investigational aspirin/placebo 300 MG tablet Alliance A011502 Take 1 tablet by mouth daily. Take with food or a full glass of water.  Do not crush enteric coated tablets. (Patient not taking: Reported on 01/01/2021) 180 tablet 0   Investigational aspirin/placebo 300 MG tablet Alliance A011502 Take 1 tablet by mouth daily. Take with food or a full glass of water.  Do not crush enteric coated tablets. (Patient not taking: Reported on 01/01/2021) 180 tablet 0   No current facility-administered medications for this visit.    OBJECTIVE: Vitals:   01/01/21 1515  BP: (!) 158/74  Pulse: (!) 58  Resp: 18  Temp: 98.4 F (36.9 C)  SpO2: 99%     Body mass index is 43.26 kg/m.    ECOG FS:0 - Asymptomatic  General: Well-developed, well-nourished, no acute distress. Eyes: Pink conjunctiva, anicteric sclera. HEENT: Normocephalic, moist mucous membranes. Breasts: Exam deferred today. Lungs:   No audible wheezing or coughing. Heart: Regular rate and rhythm. Abdomen: Soft, nontender, no obvious distention. Musculoskeletal: No edema, cyanosis, or clubbing. Neuro: Alert, answering all questions appropriately. Cranial nerves grossly intact. Skin: No rashes or petechiae noted. Psych: Normal affect.   LAB RESULTS:  Lab Results  Component Value Date   NA 141 01/01/2021   K 3.9 01/01/2021   CL 104 01/01/2021   CO2 28 01/01/2021   GLUCOSE 88 01/01/2021   BUN 11 01/01/2021   CREATININE 0.98 01/01/2021   CALCIUM 9.3 01/01/2021   PROT 7.4 01/01/2021   ALBUMIN 3.7 01/01/2021   AST 28 01/01/2021   ALT 26 01/01/2021   ALKPHOS 125 01/01/2021   BILITOT 0.4 01/01/2021   GFRNONAA >60 01/01/2021   GFRAA >60 03/15/2020    Lab Results  Component Value Date   WBC 9.7 01/01/2021   NEUTROABS 5.6 01/01/2021   HGB 13.1 01/01/2021   HCT 39.9 01/01/2021   MCV 71.8 (L) 01/01/2021   PLT 242 01/01/2021      STUDIES: No results found.  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  PLAN:    1. 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  Patient completed neoadjuvant chemotherapy with Adriamycin, Cytoxan, and Taxol on September 08, 2017.  She then underwent partial mastectomy on October 14, 2017.  Patient noted to have residual disease, therefore proceeded with adjuvant XRT which she completed on January 06, 2018.  Despite hot flashes, patient wishes to continue letrozole.  Patient will complete a minimum of 5 years of treatment in June 2024, but may consider extended treatment.  Her most recent mammogram on October 17, 2020 was reported as BI-RADS 1.  Repeat in March 2023.  Return to clinic in September 2022 as previously scheduled. 2.  Hot flashes: Chronic and unchanged.  Patient does not wish to switch letrozole at this time. 3.  Osteopenia: Patient's most recent bone mineral density on April 03, 2020 revealed a T score of -1.0 which is improved over the past years and is now considered normal.  Continue calcium and vitamin D supplementation.  Repeat bone mineral density in September 2022 prior to next clinic visit.  4.  Insomnia/anxiety: Patient does not complain of this today.  Continue Xanax as needed. 5.  Back pain: Chronic and unchanged. 6.  Clinical trials enrolled:  [id number] - BWEL: Randomized Phase III Trial Evaluating the Role of Weight Loss in Adjuvant Treatment of Overweight and Obese Women with Early Breast Cancer  A011502: A randomized phase III double blinded placebo-controlled trial of aspirin as adjuvant therapy for HER2 negative breast cancer: the ABC trial    Patient expressed understanding and was in agreement with this plan. She also  understands that She can call clinic at any time with any questions, concerns, or complaints.   Cancer Staging Primary cancer of upper outer quadrant of right female breast (HCC) Staging form: Breast, AJCC 8th Edition - Clinical stage from 03/27/2017: Stage IB (cT1c, cN1, cM0, G2, ER: Positive, PR: Positive, HER2: Negative) - Signed by Finnegan, Timothy J, MD on 03/27/2017 Histologic grading system: 3 grade system Laterality: Right - Pathologic stage from 10/22/2017: No Stage Recommended (ypT1c, pN1a, cM0, G2, ER+, PR+, HER2-) - Signed by Finnegan, Timothy J, MD on 10/22/2017 Stage prefix: Post-therapy Neoadjuvant therapy: Yes Histologic   grading system: 3 grade system Laterality: Right   Timothy J Finnegan, MD   01/02/2021 9:51 PM     

## 2021-01-01 ENCOUNTER — Inpatient Hospital Stay: Payer: Medicare HMO | Attending: Oncology

## 2021-01-01 ENCOUNTER — Inpatient Hospital Stay (HOSPITAL_BASED_OUTPATIENT_CLINIC_OR_DEPARTMENT_OTHER): Payer: Medicare HMO | Admitting: Oncology

## 2021-01-01 ENCOUNTER — Other Ambulatory Visit: Payer: Self-pay

## 2021-01-01 ENCOUNTER — Inpatient Hospital Stay: Payer: Medicare HMO

## 2021-01-01 ENCOUNTER — Encounter: Payer: Self-pay | Admitting: Oncology

## 2021-01-01 VITALS — BP 158/74 | HR 58 | Temp 98.4°F | Resp 18 | Wt 256.0 lb

## 2021-01-01 DIAGNOSIS — Z170 Estrogen receptor positive status [ER+]: Secondary | ICD-10-CM | POA: Diagnosis not present

## 2021-01-01 DIAGNOSIS — Z87891 Personal history of nicotine dependence: Secondary | ICD-10-CM | POA: Diagnosis not present

## 2021-01-01 DIAGNOSIS — Z803 Family history of malignant neoplasm of breast: Secondary | ICD-10-CM

## 2021-01-01 DIAGNOSIS — C50411 Malignant neoplasm of upper-outer quadrant of right female breast: Secondary | ICD-10-CM

## 2021-01-01 DIAGNOSIS — Z79811 Long term (current) use of aromatase inhibitors: Secondary | ICD-10-CM

## 2021-01-01 DIAGNOSIS — Z006 Encounter for examination for normal comparison and control in clinical research program: Secondary | ICD-10-CM | POA: Insufficient documentation

## 2021-01-01 DIAGNOSIS — N951 Menopausal and female climacteric states: Secondary | ICD-10-CM | POA: Insufficient documentation

## 2021-01-01 DIAGNOSIS — G8929 Other chronic pain: Secondary | ICD-10-CM | POA: Diagnosis not present

## 2021-01-01 DIAGNOSIS — M8580 Other specified disorders of bone density and structure, unspecified site: Secondary | ICD-10-CM | POA: Diagnosis not present

## 2021-01-01 DIAGNOSIS — Z8042 Family history of malignant neoplasm of prostate: Secondary | ICD-10-CM | POA: Diagnosis not present

## 2021-01-01 DIAGNOSIS — M549 Dorsalgia, unspecified: Secondary | ICD-10-CM | POA: Diagnosis not present

## 2021-01-01 DIAGNOSIS — Z801 Family history of malignant neoplasm of trachea, bronchus and lung: Secondary | ICD-10-CM

## 2021-01-01 LAB — COMPREHENSIVE METABOLIC PANEL
ALT: 26 U/L (ref 0–44)
AST: 28 U/L (ref 15–41)
Albumin: 3.7 g/dL (ref 3.5–5.0)
Alkaline Phosphatase: 125 U/L (ref 38–126)
Anion gap: 9 (ref 5–15)
BUN: 11 mg/dL (ref 6–20)
CO2: 28 mmol/L (ref 22–32)
Calcium: 9.3 mg/dL (ref 8.9–10.3)
Chloride: 104 mmol/L (ref 98–111)
Creatinine, Ser: 0.98 mg/dL (ref 0.44–1.00)
GFR, Estimated: >60 mL/min (ref >60)
Glucose, Bld: 88 mg/dL (ref 70–99)
Potassium: 3.9 mmol/L (ref 3.5–5.1)
Sodium: 141 mmol/L (ref 135–145)
Total Bilirubin: 0.4 mg/dL (ref 0.3–1.2)
Total Protein: 7.4 g/dL (ref 6.5–8.1)

## 2021-01-01 LAB — CBC WITH DIFFERENTIAL/PLATELET
Abs Immature Granulocytes: 0.05 10*3/uL (ref 0.00–0.07)
Basophils Absolute: 0.1 10*3/uL (ref 0.0–0.1)
Basophils Relative: 1 %
Eosinophils Absolute: 0.2 10*3/uL (ref 0.0–0.5)
Eosinophils Relative: 2 %
HCT: 39.9 % (ref 36.0–46.0)
Hemoglobin: 13.1 g/dL (ref 12.0–15.0)
Immature Granulocytes: 1 %
Lymphocytes Relative: 29 %
Lymphs Abs: 2.8 10*3/uL (ref 0.7–4.0)
MCH: 23.6 pg — ABNORMAL LOW (ref 26.0–34.0)
MCHC: 32.8 g/dL (ref 30.0–36.0)
MCV: 71.8 fL — ABNORMAL LOW (ref 80.0–100.0)
Monocytes Absolute: 1 10*3/uL (ref 0.1–1.0)
Monocytes Relative: 10 %
Neutro Abs: 5.6 10*3/uL (ref 1.7–7.7)
Neutrophils Relative %: 57 %
Platelets: 242 10*3/uL (ref 150–400)
RBC: 5.56 MIL/uL — ABNORMAL HIGH (ref 3.87–5.11)
RDW: 14.8 % (ref 11.5–15.5)
WBC: 9.7 10*3/uL (ref 4.0–10.5)
nRBC: 0 % (ref 0.0–0.2)

## 2021-01-01 NOTE — Research (Signed)
Destiny Rogers presented to clinic this afternoon as scheduled for her 6 month end of treatment visit on the A011502 ABC study. Patient requested to meet with the research nurse after her visit with Dr. Grayland Ormond as she wanted to see him alone. Patient was assured this would be alright by staff, research waited until her visit with the provider was over. She denies using any aspirin products or NSAID's this reporting period. Solicited and other adverse events were assessed as below. Mammogram was completed in March and negative. Weight today was 256, with BMI of 42.08 and blood pressure 158/78, pulse of 58.  States she continues to have hot flashes but they are bearable with the letrozole. She will continue to take the medication as long as needed. The patient does complain of bruising, but states she has always been like that. She states she is exercising and trying to lose weight currently and feels pretty good. The patient is aware that she is completed with the ABC protocol at this time. She will return in September for her follow up with Dr. Grayland Ormond  Solicited and other adverse events as listed below with grade and attribution.  Study/Protocol: [id number] - ABC  Cycle: 6 month End of Treatment Visit Event Grade Onset Date Resolved Date Drug Name Aspirin/placebo Attribution Treatment Comments  Gastrointestinal Bleeding 0              Intracranial Hemorrhage 0              Epistaxis 0              Hematuria 0              Dyspepsia 0           Gastritis 0              Bruising 1 08/11/202102/18/2020 09/25/2020   09/16/2019    possible    States no more than her usual, she has always bruised.   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

## 2021-01-01 NOTE — Progress Notes (Signed)
Pt in for follow up, states having some shortness of breath on minimal exertion. Pt also reports having dizziness for the last month.

## 2021-01-02 ENCOUNTER — Encounter: Payer: Self-pay | Admitting: Oncology

## 2021-02-19 ENCOUNTER — Inpatient Hospital Stay: Payer: Medicare HMO

## 2021-02-28 ENCOUNTER — Ambulatory Visit: Payer: Medicare HMO | Admitting: Radiation Oncology

## 2021-03-07 ENCOUNTER — Encounter: Payer: Self-pay | Admitting: Radiation Oncology

## 2021-03-07 ENCOUNTER — Ambulatory Visit
Admission: RE | Admit: 2021-03-07 | Discharge: 2021-03-07 | Disposition: A | Discharge status: Home / Self Care | Payer: Medicare HMO | Source: Ambulatory Visit | Attending: Radiation Oncology | Admitting: Radiation Oncology

## 2021-03-07 VITALS — BP 138/96 | HR 82 | Temp 96.0°F | Wt 256.0 lb

## 2021-03-07 DIAGNOSIS — C50411 Malignant neoplasm of upper-outer quadrant of right female breast: Secondary | ICD-10-CM | POA: Insufficient documentation

## 2021-03-07 DIAGNOSIS — Z170 Estrogen receptor positive status [ER+]: Secondary | ICD-10-CM | POA: Insufficient documentation

## 2021-03-07 DIAGNOSIS — Z79811 Long term (current) use of aromatase inhibitors: Secondary | ICD-10-CM | POA: Diagnosis not present

## 2021-03-07 DIAGNOSIS — R232 Flushing: Secondary | ICD-10-CM | POA: Insufficient documentation

## 2021-03-07 DIAGNOSIS — Z923 Personal history of irradiation: Secondary | ICD-10-CM | POA: Diagnosis not present

## 2021-03-07 NOTE — Progress Notes (Signed)
Radiation Oncology Follow up Note  Name: Destiny Rogers   Date:   03/07/2021 MRN:  [id number] DOB: 1960/10/10    This 60 y.o. female presents to the clinic today for 3-year follow-up status post whole breast radiation to her right breast for stage IIa invasive mammary carcinoma ER/PR positive.  REFERRING PROVIDER: [place] Pink, MD  HPI: Patient is a 60 year old female now out 3 years having completed whole breast radiation to her right breast for stage IIa invasive mammary carcinoma ER/PR positive.  Seen today in routine follow-up she is doing well.  She does states she has continued tenderness in her breast and some shooting type pains consistent with regeneration of nerves.  She had a mammogram back in March 2021 which I have reviewed.  It was BI-RADS 1 negative she is currently on Femara tolerant well without side effect.  She does have some hot flashes.  COMPLICATIONS OF TREATMENT: none  FOLLOW UP COMPLIANCE: keeps appointments   PHYSICAL EXAM:  BP (!) 138/96   Pulse 82   Temp (!) 96 F (35.6 C) (Tympanic)   Wt 256 lb (116.1 kg)   LMP 04/09/2005 Comment: Tubal Ligation  BMI 43.26 kg/m  Lungs are clear to A&P cardiac examination essentially unremarkable with regular rate and rhythm. No dominant mass or nodularity is noted in either breast in 2 positions examined. Incision is well-healed. No axillary or supraclavicular adenopathy is appreciated. Cosmetic result is excellent.  Still some slight tenderness in the area of lumpectomy.  Well-developed well-nourished patient in NAD. HEENT reveals PERLA, EOMI, discs not visualized.  Oral cavity is clear. No oral mucosal lesions are identified. Neck is clear without evidence of cervical or supraclavicular adenopathy. Lungs are clear to A&P. Cardiac examination is essentially unremarkable with regular rate and rhythm without murmur rub or thrill. Abdomen is benign with no organomegaly or masses noted. Motor sensory and DTR levels are equal and  symmetric in the upper and lower extremities. Cranial nerves II through XII are grossly intact. Proprioception is intact. No peripheral adenopathy or edema is identified. No motor or sensory levels are noted. Crude visual fields are within normal range.  RADIOLOGY RESULTS: Mammograms reviewed compatible with above-stated findings  PLAN: Present time she continues to do well with no evidence of disease.  On pleased with her overall progress.  I have asked to see her back in 1 year for follow-up.  Patient knows to call with any concerns.  She continues follow-up care with medical oncology.  She continues on Femara.  I would like to take this opportunity to thank you for allowing me to participate in the care of your patient.Noreene Filbert, MD

## 2021-03-12 ENCOUNTER — Ambulatory Visit: Payer: Medicare HMO | Attending: Family Medicine

## 2021-03-12 ENCOUNTER — Other Ambulatory Visit: Payer: Self-pay

## 2021-03-12 DIAGNOSIS — R42 Dizziness and giddiness: Secondary | ICD-10-CM | POA: Diagnosis present

## 2021-03-12 DIAGNOSIS — H8111 Benign paroxysmal vertigo, right ear: Secondary | ICD-10-CM | POA: Diagnosis present

## 2021-03-12 DIAGNOSIS — R2681 Unsteadiness on feet: Secondary | ICD-10-CM | POA: Insufficient documentation

## 2021-03-12 NOTE — Therapy (Signed)
[place] MAIN [facility] SERVICES [address] [place], [place], [address] Phone: [phone]   Fax:  [phone]  Physical Therapy Evaluation  Patient Details  Name: Destiny Rogers MRN: [id number] Date of Birth: 1961-05-08 Referring Provider (PT): Maryland Pink, MD   Encounter Date: 03/12/2021   PT End of Session - 03/12/21 0934     Visit Number 1    Number of Visits 8    Date for PT Re-Evaluation 04/09/21    Authorization Type [employer]    Authorization Time Period 03/12/21-05/07/21    PT Start Time 0819    PT Stop Time 0859    PT Time Calculation (min) 40 min    Activity Tolerance Patient tolerated treatment well;No increased pain    Behavior During Therapy [facility] for tasks assessed/performed             Past Medical History:  Diagnosis Date   Anemia    Arthritis    Breast cancer ([place]) 2018   Right breast cancer- [facility]   Dyspnea    SINCE STARTING CHEMO WITH EXERTION   GERD (gastroesophageal reflux disease)    NO MEDS   Heart murmur    DX IN TEENS-ASYMPTOMATIC   Hypertension    Migraines    H/O MIGRAINES   Personal history of chemotherapy 2018   finished 09/08/17   Personal history of radiation therapy    Rt. breast   Thumb fracture    LEFT-WEARING BRACE    Past Surgical History:  Procedure Laterality Date   ABLATION  2006   BREAST BIOPSY Right 03/19/2017    [place] and mets in lymph node   BREAST LUMPECTOMY Right 10/14/2017   breast ca rad and chemo    CESAREAN SECTION     FRACTURE SURGERY Left 2013   left hand, thumb (screws and titanium)   INCISION AND DRAINAGE ABSCESS Right 04/26/2018   Procedure: INCISION AND DRAINAGE ABSCESS;  Surgeon: Herbert Pun, MD;  Location: [facility] ORS;  Service: General;  Laterality: Right;   PARTIAL MASTECTOMY WITH NEEDLE LOCALIZATION Right 10/14/2017   Procedure: PARTIAL MASTECTOMY WITH NEEDLE LOCALIZATION;  Surgeon: Herbert Pun, MD;  Location: [facility] ORS;  Service:  General;  Laterality: Right;   PILONIDAL CYST EXCISION  1980'S   PORT-A-CATH REMOVAL N/A 04/27/2017   Procedure: REMOVAL OF PORT A CATH & insertion PORT A CATH LEFT INTERNAL JUGULAR;  Surgeon: Herbert Pun, MD;  Location: [facility] ORS;  Service: General;  Laterality: N/A;   PORTACATH PLACEMENT N/A 04/15/2017   Procedure: INSERTION PORT-A-CATH;  Surgeon: Herbert Pun, MD;  Location: [facility] ORS;  Service: General;  Laterality: N/A;   SENTINEL NODE BIOPSY Right 10/14/2017   Procedure: SENTINEL NODE BIOPSY;  Surgeon: Herbert Pun, MD;  Location: [facility] ORS;  Service: General;  Laterality: Right;    There were no vitals filed for this visit.    Subjective Assessment - 03/12/21 0910     Subjective Pt presenting to OPPT neuro for assessment of intermittent dizziness problem that started in March 2022.    Pertinent History Destiny Rogers is a 67yoF who presents with CC short-duration movement-mediated episodic dizziness. Pt reports primary aggravating factor as turning over in bed at night (bilat) or during day when lying down. Pt also has dizzness when coming from lying down to sitting EOB. Pt reports episodes are all related to movement, described as vertiginous, all lasting less than 30sec duration. Pt denies any changes to hearing, no tinitus, denies any preceeding ear infection. Pt  has since had bilat maxillary tooth ache and extraction which caused refarral ear ache at the time. Pt denies any falls, but does report self and family concerns of safety in walking outside. Per pt report geenral dizziness can be generated high visual, high head-movement scenarios presented in a busy grocery store aisle. PMH: Rt BrCA s/p radiation 2019, remote Lt wrist fracture, remore Rt thumb fracture, lumbar spine disease, depression.    Patient Stated Goals Be rid of symptoms of dizziness    Currently in Pain? No/denies                [facility] PT Assessment - 03/12/21 0001       Assessment    Medical Diagnosis Episodic dizziness with position change    Referring Provider (PT) Maryland Pink, MD    Onset Date/Surgical Date --   March 2022   Next MD Visit Today, unrelated issue    Prior Therapy none      Precautions   Precautions Other (comment)   Restricted Right UE     Restrictions   Weight Bearing Restrictions No      Balance Screen   Has the patient fallen in the past 6 months No    Has the patient had a decrease in activity level because of a fear of falling?  Yes    Is the patient reluctant to leave their home because of a fear of falling?  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      Observation/Other Assessments   Focus on Therapeutic Outcomes (FOTO)  88    Other Surveys  Dizziness Handicap Inventory [facility]);Activities of Balance and Confidence Scale    Activities of Balance Confidence Scale (ABC Scale)  70    Dizziness Handicap Inventory (DHI)  42      Posture/Postural Control   Posture Comments advanced DJD LLE   increased Lateral tibial torsion, midfoot collapse, midfoot prontation midstance to toe-off, increased structural genu valgus     Bed Mobility   Bed Mobility --   All modI; has not been in prone since BrCA in 2019     Transfers   Transfers Sit to Stand    Sit to Stand 6: Modified independent (Device/Increase time)    Comments labored and difficulty, require concerted effort adn planning      Ambulation/Gait   Ambulation/Gait Yes    Ambulation/Gait Assistance 7: Independent    Gait Comments AMB into and out of clinic   advanced DJD LLE as mentioned above, well compensated, no apparent unsteadiness of gait.     Balance   Balance Assessed No   ABC Scale: 70/100;                   Vestibular Assessment - 03/12/21 0001       Symptom Behavior   Type of Dizziness  Spinning;Unsteady with head/body turns;"World moves"   Feel like  Dorothy in [employer]   Frequency of Dizziness daily    Duration of Dizziness <60sec    Symptom Nature Motion provoked    Aggravating Factors Turning head quickly;Walking in a crowd;Supine to sit;Rolling to right;Rolling to left;Forward bending    Relieving Factors Rest;Closing eyes   time to resolution   Progression of Symptoms No change since onset    History of similar episodes none  Oculomotor Exam   Oculomotor Alignment Normal    Ocular ROM WNL    Spontaneous Absent    Gaze-induced  Absent    Head shaking Horizontal Absent   VOR intact and smooth   Smooth Pursuits Intact    Saccades Overshoots;Intact   minimal   Comment Largely unremarkable exam      Oculomotor Exam-Fixation Suppressed    Ocular Alignment WNL      Auditory   Comments hearing symmetrical, no acute changes   pt reports hearing is "very good"     Positional Testing   Dix-Hallpike Dix-Hallpike Right;Dix-Hallpike Left    Horizontal Canal Testing Horizontal Canal Right;Horizontal Canal Left      Dix-Hallpike Right   Dix-Hallpike Right Duration Negative; mild disequilibrium without vertigo/nystagmus    Dix-Hallpike Right Symptoms No nystagmus      Dix-Hallpike Left   Dix-Hallpike Left Duration Negative; no symptoms    Dix-Hallpike Left Symptoms No nystagmus      Horizontal Canal Right   Horizontal Canal Right Symptoms Other (comment)   no nystagmus seen, but pt has ~20sec nausea     Horizontal Canal Left   Horizontal Canal Left Symptoms Normal   no symptoms     Cognition   Cognition Orientation Level Oriented x 4                Objective measurements completed on examination: See above findings.        Vestibular Treatment/Exercise - 03/12/21 0001       Vestibular Treatment/Exercise   Vestibular Treatment Provided Canalith Repositioning    Canalith Repositioning Canal Roll Right      Canal Roll Right   Number of Reps  1    Overall Response  No change    Response Details   intermittent nausea in testing, no nystagmus seen.                   PT Education - 03/12/21 0933     Education Details BPPV testing appropriateness, what to expect post testing and treatment.    Person(s) Educated Patient    Methods Explanation;Demonstration    Comprehension Verbalized understanding;Need further instruction;Returned demonstration              PT Short Term Goals - 03/12/21 1002       PT SHORT TERM GOAL #1   Title Pt to improve FOTO score to >60 to demonstrate improved perception of ability in ADL/IADL.    Baseline Eval: 52/100    Time 4    Period Weeks    Status New    Target Date 04/09/21      PT SHORT TERM GOAL #2   Title Pt to demonstrate improved DHI score <40 to demonstrate reduced feelings of handicap.    Baseline Eval: DHI 42    Time 4    Period Weeks    Status New    Target Date 04/09/21      PT SHORT TERM GOAL #3   Title Pt to show improved score on ABC scale >82/100 to demonstrate improved confidence in balance.    Baseline Eval: ABC Scale 70/100    Time 4    Period Weeks    Status New    Target Date 04/09/21      PT SHORT TERM GOAL #4   Title Pt to report return to fitness/leisure walking in neighborhood streets as desired without concern of sustaining a fall.    Baseline At eval: avoiding AMB  in street due to concerns of safety    Time 4    Period Weeks    Status New    Target Date 04/09/21                       Plan - 03/12/21 0935     Clinical Impression Statement Destiny Rogers is a 64yoF presenting with CC of movement provoked dizziness. Subjective report is consistent with typical BPPV presentation. Exam reveals normal occulomotor exam, hearing screening WNL. No falls history, however pt is concerned about sustaining a fall because of her dizzinress episodes. FOTO score 52/100. Exam reveals negative Dix Hallpike test bilat, and negative Left horizontla canal testing. Pt has symptomatic testing of Rt  horizontal canal testing, but no nystagmus is seen (no goggles used this exam.) Pt treated with BBQ Roll intervention for right sided horizontal canal BPPV, mildly provoked symptoms, some nausea, no emesis, no nystagmus. Pt educated on symptoms to expect after treatment and safety at home. Additional HEP deferred at this visit until latter. Pt will benefit from skill intervention to resolve dizziness complaint and improve confidence, safety, and activity tolerance for independent ADL, IADL mobility for return to baseline.    Personal Factors and Comorbidities Age    Examination-Activity Limitations Bed Mobility;Locomotion Level;Transfers;Squat;Stand    Examination-Participation Restrictions Yard Work;Cleaning    Stability/Clinical Decision Making Stable/Uncomplicated    Clinical Decision Making Low    Rehab Potential Excellent    PT Frequency 2x / week    PT Duration 4 weeks    PT Treatment/Interventions Canalith Repostioning;Neuromuscular re-education;Balance training;Therapeutic exercise;Therapeutic activities;Functional mobility training;Patient/family education;Vestibular;Visual/perceptual remediation/compensation    PT Next Visit Plan Retest Horizontal Canal, retreat as indicated, Investigate other need for accomodation training    PT Home Exercise Plan None given at evaluation    Consulted and Agree with Plan of Care Patient             Patient will benefit from skilled therapeutic intervention in order to improve the following deficits and impairments:  Decreased activity tolerance, Decreased mobility, Improper body mechanics, Decreased knowledge of precautions, Difficulty walking, Dizziness, Impaired vision/preception  Visit Diagnosis: Dizziness and giddiness  BPPV (benign paroxysmal positional vertigo), right     Problem List Patient Active Problem List   Diagnosis Date Noted   Cancer ([place])    Hyperlipidemia, unspecified 08/04/2017   Goals of care, counseling/discussion  04/03/2017   Primary cancer of upper outer quadrant of right female breast ([place]) 03/27/2017   Depression 12/15/2013   Essential hypertension 12/15/2013   Migraines 12/15/2013   12:48 PM, 03/12/21 Etta Grandchild, PT, DPT Physical Therapist - [place] [phone]     Etta Grandchild 03/12/2021, 11:00 AM  [place] MAIN [facility] SERVICES [address] [place], [place], [address] Phone: [phone]   Fax:  [phone]  Name: SHUNTIA EXTON MRN: [id number] Date of Birth: 03-29-1961

## 2021-03-18 ENCOUNTER — Encounter: Payer: Self-pay | Admitting: Oncology

## 2021-03-18 ENCOUNTER — Ambulatory Visit: Payer: Medicare HMO

## 2021-03-18 ENCOUNTER — Other Ambulatory Visit: Payer: Self-pay

## 2021-03-18 DIAGNOSIS — R42 Dizziness and giddiness: Secondary | ICD-10-CM | POA: Diagnosis not present

## 2021-03-18 DIAGNOSIS — R2681 Unsteadiness on feet: Secondary | ICD-10-CM

## 2021-03-18 NOTE — Therapy (Signed)
[place] MAIN [facility] SERVICES [address] [place], [place], [address] Phone: [phone]   Fax:  [phone]  Physical Therapy Treatment  Patient Details  Name: Destiny Rogers MRN: [id number] Date of Birth: 04/02/1961 Referring Provider (PT): Maryland Pink, MD   Encounter Date: 03/18/2021   PT End of Session - 03/18/21 1623     Visit Number 2    Number of Visits 8    Date for PT Re-Evaluation 04/09/21    Authorization Type [employer]na Medicare    Authorization Time Period 03/12/21-05/07/21    PT Start Time 3875    PT Stop Time 1605    PT Time Calculation (min) 42 min    Equipment Utilized During Treatment Gait belt    Activity Tolerance Patient tolerated treatment well;No increased pain    Behavior During Therapy [facility] for tasks assessed/performed             Past Medical History:  Diagnosis Date   Anemia    Arthritis    Breast cancer ([place]) 2018   Right breast cancer- [facility]   Dyspnea    SINCE STARTING CHEMO WITH EXERTION   GERD (gastroesophageal reflux disease)    NO MEDS   Heart murmur    DX IN TEENS-ASYMPTOMATIC   Hypertension    Migraines    H/O MIGRAINES   Personal history of chemotherapy 2018   finished 09/08/17   Personal history of radiation therapy    Rt. breast   Thumb fracture    LEFT-WEARING BRACE    Past Surgical History:  Procedure Laterality Date   ABLATION  2006   BREAST BIOPSY Right 03/19/2017    [place] and mets in lymph node   BREAST LUMPECTOMY Right 10/14/2017   breast ca rad and chemo    CESAREAN SECTION     FRACTURE SURGERY Left 2013   left hand, thumb (screws and titanium)   INCISION AND DRAINAGE ABSCESS Right 04/26/2018   Procedure: INCISION AND DRAINAGE ABSCESS;  Surgeon: Herbert Pun, MD;  Location: [facility] ORS;  Service: General;  Laterality: Right;   PARTIAL MASTECTOMY WITH NEEDLE LOCALIZATION Right 10/14/2017   Procedure: PARTIAL MASTECTOMY WITH NEEDLE LOCALIZATION;  Surgeon: Herbert Pun, MD;  Location: [facility] ORS;  Service: General;  Laterality: Right;   PILONIDAL CYST EXCISION  1980'S   PORT-A-CATH REMOVAL N/A 04/27/2017   Procedure: REMOVAL OF PORT A CATH & insertion PORT A CATH LEFT INTERNAL JUGULAR;  Surgeon: Herbert Pun, MD;  Location: [facility] ORS;  Service: General;  Laterality: N/A;   PORTACATH PLACEMENT N/A 04/15/2017   Procedure: INSERTION PORT-A-CATH;  Surgeon: Herbert Pun, MD;  Location: [facility] ORS;  Service: General;  Laterality: N/A;   SENTINEL NODE BIOPSY Right 10/14/2017   Procedure: SENTINEL NODE BIOPSY;  Surgeon: Herbert Pun, MD;  Location: [facility] ORS;  Service: General;  Laterality: Right;    There were no vitals filed for this visit.   Subjective Assessment - 03/18/21 1620     Subjective Pt reports no pain currently. Reports no falls or near-falls. She reports she has completed course of prednisone and has reduced dose of one of her BP medications since last session (she is unsure which one). She reports only two vertigo episodes of decreased intensity since last appointment. Does report some room spinning and some light-headedness.    Pertinent History Destiny Rogers is a 14yoF who presents with CC short-duration movement-mediated episodic dizziness. Pt reports primary aggravating factor as turning over in bed at night (bilat) or  during day when lying down. Pt also has dizzness when coming from lying down to sitting EOB. Pt reports episodes are all related to movement, described as vertiginous, all lasting less than 30sec duration. Pt denies any changes to hearing, no tinitus, denies any preceeding ear infection. Pt has since had bilat maxillary tooth ache and extraction which caused refarral ear ache at the time. Pt denies any falls, but does report self and family concerns of safety in walking outside. Per pt report geenral dizziness can be generated high visual, high head-movement scenarios presented in a busy grocery store aisle. PMH:  Rt BrCA s/p radiation 2019, remote Lt wrist fracture, remore Rt thumb fracture, lumbar spine disease, depression.    Patient Stated Goals Be rid of symptoms of dizziness    Currently in Pain? No/denies              TREATMENT  Gufoni maneuver to treat R side  2x- With first maneuver pt reports queasiness that lasted less than a minute upon sitting back up. No queasiness with second rep. Pt reports no spinning throughout.  Roll testing following maneuver: negative B, no sx  Orthostatics BP taken on LUE (pt reports cannot take on R)  Supine:140/57 mmHg Seated: 132/61 mmHg no sx  Standing: 127-8/71 mmHg no sx   Dynamic visual acuity - WNL (one line difference)  M-CTSIB-  pt able to perform all conditions except for condition 4     PT Education - 03/18/21 1622     Education Details Continued education on testing, manuevers used, precautions to take post Gufoni maneuver    Person(s) Educated Patient    Methods Explanation    Comprehension Verbalized understanding              PT Short Term Goals - 03/12/21 1002       PT SHORT TERM GOAL #1   Title Pt to improve FOTO score to >60 to demonstrate improved perception of ability in ADL/IADL.    Baseline Eval: 52/100    Time 4    Period Weeks    Status New    Target Date 04/09/21      PT SHORT TERM GOAL #2   Title Pt to demonstrate improved DHI score <40 to demonstrate reduced feelings of handicap.    Baseline Eval: DHI 42    Time 4    Period Weeks    Status New    Target Date 04/09/21      PT SHORT TERM GOAL #3   Title Pt to show improved score on ABC scale >82/100 to demonstrate improved confidence in balance.    Baseline Eval: ABC Scale 70/100    Time 4    Period Weeks    Status New    Target Date 04/09/21      PT SHORT TERM GOAL #4   Title Pt to report return to fitness/leisure walking in neighborhood streets as desired without concern of sustaining a fall.    Baseline At eval: avoiding AMB in street due  to concerns of safety    Time 4    Period Weeks    Status New    Target Date 04/09/21                      Plan - 03/18/21 1624     Clinical Impression Statement Further assessment completed on this date. PT also performed Gufoni maneuver 2x and Roll testing again. With first Gufoni manuever to treat R  side, pt reported queasiness that lasted <1 min upon sitting up at end of maneuver. With second maneuver to treat R side pt reported no sx. Roll testing was negative B following maneuver. Orthostatics were unremarkable. Pt also performed M-CTSIB and was able to complete all conditions except for condition 4. This indicates pt with difficutly using vestibular cues to maintain balance. The pt will benefit from further skilled PT to resolve dizziness related sx and to improve balance in order to decrease fall risk and increase safety wtih ADLs.    Personal Factors and Comorbidities Age    Examination-Activity Limitations Bed Mobility;Locomotion Level;Transfers;Squat;Stand    Examination-Participation Restrictions Yard Work;Cleaning    Stability/Clinical Decision Making Stable/Uncomplicated    Rehab Potential Excellent    PT Frequency 2x / week    PT Duration 4 weeks    PT Treatment/Interventions Canalith Repostioning;Neuromuscular re-education;Balance training;Therapeutic exercise;Therapeutic activities;Functional mobility training;Patient/family education;Vestibular;Visual/perceptual remediation/compensation    PT Next Visit Plan Retest Horizontal Canal, retreat as indicated, Investigate other need for accomodation training    PT Home Exercise Plan None given at evaluation    Consulted and Agree with Plan of Care Patient             Patient will benefit from skilled therapeutic intervention in order to improve the following deficits and impairments:  Decreased activity tolerance, Decreased mobility, Improper body mechanics, Decreased knowledge of precautions, Difficulty walking,  Dizziness, Impaired vision/preception  Visit Diagnosis: Unsteadiness on feet     Problem List Patient Active Problem List   Diagnosis Date Noted   Cancer ([place])    Hyperlipidemia, unspecified 08/04/2017   Goals of care, counseling/discussion 04/03/2017   Primary cancer of upper outer quadrant of right female breast ([place]) 03/27/2017   Depression 12/15/2013   Essential hypertension 12/15/2013   Migraines 12/15/2013   Ricard Dillon PT, DPT  03/18/2021, 4:33 PM  [place] MAIN [facility] SERVICES [address] [place], [place], [address] Phone: [phone]   Fax:  [phone]  Name: Destiny Rogers MRN: [id number] Date of Birth: February 10, 1961

## 2021-03-20 ENCOUNTER — Ambulatory Visit: Payer: Medicare HMO

## 2021-03-26 ENCOUNTER — Other Ambulatory Visit: Payer: Medicare HMO

## 2021-03-28 ENCOUNTER — Ambulatory Visit: Payer: Medicare HMO | Attending: Family Medicine

## 2021-03-28 ENCOUNTER — Other Ambulatory Visit: Payer: Self-pay

## 2021-03-28 DIAGNOSIS — R2681 Unsteadiness on feet: Secondary | ICD-10-CM | POA: Diagnosis present

## 2021-03-28 DIAGNOSIS — R2689 Other abnormalities of gait and mobility: Secondary | ICD-10-CM | POA: Diagnosis present

## 2021-03-28 DIAGNOSIS — M6281 Muscle weakness (generalized): Secondary | ICD-10-CM | POA: Insufficient documentation

## 2021-03-28 DIAGNOSIS — R42 Dizziness and giddiness: Secondary | ICD-10-CM | POA: Insufficient documentation

## 2021-03-28 NOTE — Therapy (Signed)
[place] MAIN [facility] SERVICES [address] [place], [place], [address] Phone: [phone]   Fax:  [phone]  Physical Therapy Treatment  Patient Details  Name: Destiny Rogers MRN: [id number] Date of Birth: 1961-03-27 Referring Provider (PT): Maryland Pink, MD   Encounter Date: 03/28/2021   PT End of Session - 03/28/21 0915     Visit Number 3    Number of Visits 8    Date for PT Re-Evaluation 04/09/21    Authorization Type [employer]    Authorization Time Period 03/12/21-05/07/21    PT Start Time 0831    PT Stop Time 0912    PT Time Calculation (min) 41 min    Equipment Utilized During Treatment Gait belt    Activity Tolerance Patient tolerated treatment well;No increased pain    Behavior During Therapy [facility] for tasks assessed/performed             Past Medical History:  Diagnosis Date   Anemia    Arthritis    Breast cancer ([place]) 2018   Right breast cancer- [facility]   Dyspnea    SINCE STARTING CHEMO WITH EXERTION   GERD (gastroesophageal reflux disease)    NO MEDS   Heart murmur    DX IN TEENS-ASYMPTOMATIC   Hypertension    Migraines    H/O MIGRAINES   Personal history of chemotherapy 2018   finished 09/08/17   Personal history of radiation therapy    Rt. breast   Thumb fracture    LEFT-WEARING BRACE    Past Surgical History:  Procedure Laterality Date   ABLATION  2006   BREAST BIOPSY Right 03/19/2017    [place] and mets in lymph node   BREAST LUMPECTOMY Right 10/14/2017   breast ca rad and chemo    CESAREAN SECTION     FRACTURE SURGERY Left 2013   left hand, thumb (screws and titanium)   INCISION AND DRAINAGE ABSCESS Right 04/26/2018   Procedure: INCISION AND DRAINAGE ABSCESS;  Surgeon: Herbert Pun, MD;  Location: [facility] ORS;  Service: General;  Laterality: Right;   PARTIAL MASTECTOMY WITH NEEDLE LOCALIZATION Right 10/14/2017   Procedure: PARTIAL MASTECTOMY WITH NEEDLE LOCALIZATION;  Surgeon: Herbert Pun, MD;  Location: [facility] ORS;  Service: General;  Laterality: Right;   PILONIDAL CYST EXCISION  1980'S   PORT-A-CATH REMOVAL N/A 04/27/2017   Procedure: REMOVAL OF PORT A CATH & insertion PORT A CATH LEFT INTERNAL JUGULAR;  Surgeon: Herbert Pun, MD;  Location: [facility] ORS;  Service: General;  Laterality: N/A;   PORTACATH PLACEMENT N/A 04/15/2017   Procedure: INSERTION PORT-A-CATH;  Surgeon: Herbert Pun, MD;  Location: [facility] ORS;  Service: General;  Laterality: N/A;   SENTINEL NODE BIOPSY Right 10/14/2017   Procedure: SENTINEL NODE BIOPSY;  Surgeon: Herbert Pun, MD;  Location: [facility] ORS;  Service: General;  Laterality: Right;    There were no vitals filed for this visit.   Subjective Assessment - 03/28/21 0830     Subjective Pt reports only one instance of dizziness on Sunday since last appointment after she got up to take her medicine (reports she had forgotten to take her medicine). She says dizziness lasted about 1 minute. She reports she is taking a new dose of one of her BP medications and a new gout medication.    Pertinent History [name] is a 78yoF who presents with CC short-duration movement-mediated episodic dizziness. Pt reports primary aggravating factor as turning over in bed at night (bilat) or during day  when lying down. Pt also has dizzness when coming from lying down to sitting EOB. Pt reports episodes are all related to movement, described as vertiginous, all lasting less than 30sec duration. Pt denies any changes to hearing, no tinitus, denies any preceeding ear infection. Pt has since had bilat maxillary tooth ache and extraction which caused refarral ear ache at the time. Pt denies any falls, but does report self and family concerns of safety in walking outside. Per pt report geenral dizziness can be generated high visual, high head-movement scenarios presented in a busy grocery store aisle. PMH: Rt BrCA s/p radiation 2019, remote Lt wrist fracture,  remore Rt thumb fracture, lumbar spine disease, depression.    Patient Stated Goals Be rid of symptoms of dizziness    Currently in Pain? No/denies             TREATMENT -   Left head impulse test: Positive, patient exhibits undershooting followed by a corrective saccade Right head impulse test: Negative     Seated VORx1 with horizontal head-turns - 5x30 seconds; pt reports brief instance of seeing double that quickly resolves with rest. Reports dizziness reaches 2-3/10, pt reports "wavy" look when turning head to L side.   Seated VORx1 with vertical head turns - 5x30 sec  Dizziness reaches 3/10   At support surface with contact-guard assist while standing on Airex: WBOS EO 30 sec WBOS EC 2x30 sec NBOS EO 30 sec NBOS EC 2x30 sec  Semi-tandem with increased BOS 2x30 sec each LE; rates hard  Comments: Patient requires intermittent upper extremity support with narrow base of support eyes closed and with semitandem stance  SLB - 30 sec bilaterally, very difficult in part d/t fatigue, patient unable to complete further reps  Seated visual tracking with multicolored ball with trunk twist side to side -2 x 10 each side.  Patient reports only mild symptoms with second set.    HEP: PT educates patient on the below exercise regarding sets, reps, frequency, rest, and to perform the exercises seated with a plain background for safety and symptom modulation. Access Code: [email or web address] URL: [email or web address] Date: 03/28/2021 Prepared by: Ricard Dillon  Exercises Seated Gaze Stabilization with Head Rotation - 1 x daily - 7 x weekly - 6 sets - 2 reps - 30 hold   Plan - 03/28/21 0916     Clinical Impression Statement Head impulse testing completed on this date.  Patient with positive left head impulse test demonstrating undershooting with a corrective saccade.  PT issued seated VOR x1 with horizontal head turns for HEP (see note for details).  Patient very symptomatic with  all VOR exercises, with reporting double vision with one bout of seated VOR with horizontal head turns.  Patient dizziness reached 3 out of 10, but resolved within 1 minute of rest.  Patient shows improvement today with performing compliant surface training that relies on vestibular cues.  Patient was very challenged with single-leg balance in part due to lower extremity fatigue.  The patient will benefit from further skilled PT to continue to improve the aforementioned deficits and balance to decrease fall risk.    Personal Factors and Comorbidities Age    Examination-Activity Limitations Bed Mobility;Locomotion Level;Transfers;Squat;Stand    Examination-Participation Restrictions Yard Work;Cleaning    Stability/Clinical Decision Making Stable/Uncomplicated    Rehab Potential Excellent    PT Frequency 2x / week    PT Duration 4 weeks    PT Treatment/Interventions Canalith Repostioning;Neuromuscular re-education;Balance training;Therapeutic exercise;Therapeutic activities;Functional mobility training;Patient/family education;Vestibular;Visual/perceptual remediation/compensation  PT Next Visit Plan VOR exercises and balance training    PT Home Exercise Plan ?Access Code: [email or web address]    Consulted and Agree with Plan of Care Patient                PT Education - 03/28/21 0914     Education Details Findings of head impulse testing and implications for PT.  PT issued HEP of seated VOR x1 with horizontal head turns and instructed patient on frequency, sets, reps and rest and what to expect with the exercise.    Person(s) Educated Patient    Methods Explanation;Demonstration;Tactile cues;Verbal cues;Other (comment)   Emailed patient HEP with patient's permission over med bridge   Comprehension Verbalized understanding;Returned demonstration;Verbal cues required;Need further instruction            Note: Portions of this document were prepared using Dragon voice recognition software and  although reviewed may contain unintentional dictation errors in syntax, grammar, or spelling.     PT Short Term Goals - 03/12/21 1002       PT SHORT TERM GOAL #1   Title Pt to improve FOTO score to >60 to demonstrate improved perception of ability in ADL/IADL.    Baseline Eval: 52/100    Time 4    Period Weeks    Status New    Target Date 04/09/21      PT SHORT TERM GOAL #2   Title Pt to demonstrate improved DHI score <40 to demonstrate reduced feelings of handicap.    Baseline Eval: DHI 42    Time 4    Period Weeks    Status New    Target Date 04/09/21      PT SHORT TERM GOAL #3   Title Pt to show improved score on ABC scale >82/100 to demonstrate improved confidence in balance.    Baseline Eval: ABC Scale 70/100    Time 4    Period Weeks    Status New    Target Date 04/09/21      PT SHORT TERM GOAL #4   Title Pt to report return to fitness/leisure walking in neighborhood streets as desired without concern of sustaining a fall.    Baseline At eval: avoiding AMB in street due to concerns of safety    Time 4    Period Weeks    Status New    Target Date 04/09/21             Patient will benefit from skilled therapeutic intervention in order to improve the following deficits and impairments:  Decreased activity tolerance, Decreased mobility, Improper body mechanics, Decreased knowledge of precautions, Difficulty walking, Dizziness, Impaired vision/preception  Visit Diagnosis: Dizziness and giddiness  Unsteadiness on feet  Other abnormalities of gait and mobility  Muscle weakness (generalized)     Problem List Patient Active Problem List   Diagnosis Date Noted   Cancer ([place])    Hyperlipidemia, unspecified 08/04/2017   Goals of care, counseling/discussion 04/03/2017   Primary cancer 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

## 2021-04-02 ENCOUNTER — Ambulatory Visit: Payer: Medicare HMO

## 2021-04-02 ENCOUNTER — Other Ambulatory Visit: Payer: Medicare HMO

## 2021-04-02 ENCOUNTER — Ambulatory Visit: Payer: Medicare HMO | Admitting: Oncology

## 2021-04-03 ENCOUNTER — Encounter: Payer: Self-pay | Admitting: Oncology

## 2021-04-04 ENCOUNTER — Ambulatory Visit: Payer: Medicare HMO

## 2021-04-05 ENCOUNTER — Other Ambulatory Visit: Payer: Self-pay | Admitting: Oncology

## 2021-04-05 ENCOUNTER — Inpatient Hospital Stay: Payer: Medicare HMO | Attending: Oncology | Admitting: Oncology

## 2021-04-05 ENCOUNTER — Inpatient Hospital Stay: Payer: Medicare HMO

## 2021-04-05 ENCOUNTER — Encounter: Payer: Self-pay | Admitting: Medical Oncology

## 2021-04-05 VITALS — BP 129/94 | HR 86 | Temp 97.1°F | Wt 254.2 lb

## 2021-04-05 DIAGNOSIS — C50411 Malignant neoplasm of upper-outer quadrant of right female breast: Secondary | ICD-10-CM

## 2021-04-05 DIAGNOSIS — Z79811 Long term (current) use of aromatase inhibitors: Secondary | ICD-10-CM | POA: Insufficient documentation

## 2021-04-05 DIAGNOSIS — B372 Candidiasis of skin and nail: Secondary | ICD-10-CM | POA: Diagnosis not present

## 2021-04-05 DIAGNOSIS — Z170 Estrogen receptor positive status [ER+]: Secondary | ICD-10-CM | POA: Diagnosis not present

## 2021-04-05 DIAGNOSIS — Z7982 Long term (current) use of aspirin: Secondary | ICD-10-CM | POA: Insufficient documentation

## 2021-04-05 DIAGNOSIS — I10 Essential (primary) hypertension: Secondary | ICD-10-CM | POA: Insufficient documentation

## 2021-04-05 DIAGNOSIS — Z79899 Other long term (current) drug therapy: Secondary | ICD-10-CM | POA: Insufficient documentation

## 2021-04-05 DIAGNOSIS — Z923 Personal history of irradiation: Secondary | ICD-10-CM | POA: Insufficient documentation

## 2021-04-05 LAB — COMPREHENSIVE METABOLIC PANEL
ALT: 22 U/L (ref 0–44)
AST: 22 U/L (ref 15–41)
Albumin: 3.8 g/dL (ref 3.5–5.0)
Alkaline Phosphatase: 138 U/L — ABNORMAL HIGH (ref 38–126)
Anion gap: 11 (ref 5–15)
BUN: 16 mg/dL (ref 6–20)
CO2: 28 mmol/L (ref 22–32)
Calcium: 9 mg/dL (ref 8.9–10.3)
Chloride: 100 mmol/L (ref 98–111)
Creatinine, Ser: 0.95 mg/dL (ref 0.44–1.00)
GFR, Estimated: >60 mL/min (ref >60)
Glucose, Bld: 180 mg/dL — ABNORMAL HIGH (ref 70–99)
Potassium: 3.6 mmol/L (ref 3.5–5.1)
Sodium: 139 mmol/L (ref 135–145)
Total Bilirubin: 0.4 mg/dL (ref 0.3–1.2)
Total Protein: 7.4 g/dL (ref 6.5–8.1)

## 2021-04-05 LAB — CBC WITH DIFFERENTIAL/PLATELET
Abs Immature Granulocytes: 0.05 10*3/uL (ref 0.00–0.07)
Basophils Absolute: 0.1 10*3/uL (ref 0.0–0.1)
Basophils Relative: 1 %
Eosinophils Absolute: 0.2 10*3/uL (ref 0.0–0.5)
Eosinophils Relative: 3 %
HCT: 41.3 % (ref 36.0–46.0)
Hemoglobin: 13.3 g/dL (ref 12.0–15.0)
Immature Granulocytes: 1 %
Lymphocytes Relative: 28 %
Lymphs Abs: 2.3 10*3/uL (ref 0.7–4.0)
MCH: 23.5 pg — ABNORMAL LOW (ref 26.0–34.0)
MCHC: 32.2 g/dL (ref 30.0–36.0)
MCV: 72.8 fL — ABNORMAL LOW (ref 80.0–100.0)
Monocytes Absolute: 0.7 10*3/uL (ref 0.1–1.0)
Monocytes Relative: 8 %
Neutro Abs: 4.8 10*3/uL (ref 1.7–7.7)
Neutrophils Relative %: 59 %
Platelets: 247 10*3/uL (ref 150–400)
RBC: 5.67 MIL/uL — ABNORMAL HIGH (ref 3.87–5.11)
RDW: 14.9 % (ref 11.5–15.5)
WBC: 8 10*3/uL (ref 4.0–10.5)
nRBC: 0 % (ref 0.0–0.2)

## 2021-04-05 MED ORDER — NYSTATIN 100000 UNIT/GM EX POWD: 1.0000 "application " | Freq: Three times a day (TID) | CUTANEOUS | 0 refills | Status: DC

## 2021-04-05 MED ORDER — NYSTATIN-TRIAMCINOLONE 100000-0.1 UNIT/GM-% EX OINT: 1.0000 "application " | TOPICAL_OINTMENT | Freq: Two times a day (BID) | CUTANEOUS | 0 refills | Status: DC

## 2021-04-05 NOTE — Progress Notes (Signed)
Pt c/o being raw under both breasts and underneath stomach folds. Cream has been prescribed with no relief. No other complaints at this time.

## 2021-04-05 NOTE — Progress Notes (Signed)
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  History of present illness- Destiny Rogers is a 60 year old female with past medical history significant for hypertension, migraines, depression, hyperlipidemia who is followed Dr. Grayland Ormond for stage Ib ER/PR positive HER2/neu negative right breast cancer.  She completed 4 cycles of Adriamycin/Cytoxan followed by 12 cycles of Taxol on 09/08/2017.  She completed adjuvant XRT on 01/07/2018.  She is now on letrozole.  INTERVAL HISTORY: Destiny Rogers is doing okay today.  Reports being anxious coming back to the cancer center.  She has been very tearful.  Reports stable hot flashes and neuropathy.  Has some chronic back pain.  She is still not working.  Recently was diagnosed with gout and has had several flares.  She completed a course of steroids at the end of August.  Reports some brain fog.  Is scheduled for colonoscopy next month.  She will have a bone density scan at the end of this month.  Has occasional constipation but none recently.  Has been having some itching underneath of her breast and in her groin.  REVIEW OF SYSTEMS:   Review of Systems  Gastrointestinal:  Positive for constipation.  Musculoskeletal:  Positive for joint pain.  Skin:  Positive for itching and rash.  Psychiatric/Behavioral:  Positive for memory loss. The patient is nervous/anxious.    All other systems reviewed and are negative.  As per HPI. Otherwise, a complete review of systems is negative.  PAST MEDICAL HISTORY: Past Medical History:  Diagnosis Date   Anemia    Arthritis    Breast cancer ([place]) 2018   Right breast cancer- [facility]   Dyspnea    SINCE STARTING CHEMO WITH EXERTION   GERD (gastroesophageal reflux disease)    NO MEDS   Heart murmur    DX IN TEENS-ASYMPTOMATIC   Hypertension    Migraines    H/O MIGRAINES   Personal history of chemotherapy 2018   finished 09/08/17   Personal history of radiation therapy    Rt. breast   Thumb fracture    LEFT-WEARING BRACE    PAST SURGICAL HISTORY: Past Surgical History:  Procedure Laterality Date   ABLATION  2006   BREAST BIOPSY Right 03/19/2017    [place] and mets in lymph node   BREAST LUMPECTOMY Right 10/14/2017   breast ca rad and chemo    CESAREAN SECTION     FRACTURE SURGERY Left 2013   left hand, thumb (screws and titanium)   INCISION AND DRAINAGE ABSCESS Right 04/26/2018   Procedure: INCISION AND DRAINAGE ABSCESS;  Surgeon: Herbert Pun, MD;  Location: [facility] ORS;  Service: General;  Laterality: Right;   PARTIAL MASTECTOMY WITH NEEDLE LOCALIZATION Right 10/14/2017   Procedure: PARTIAL MASTECTOMY WITH NEEDLE LOCALIZATION;  Surgeon: Herbert Pun, MD;  Location: [facility] ORS;  Service: General;  Laterality: Right;   PILONIDAL CYST EXCISION  1980'S   PORT-A-CATH REMOVAL N/A 04/27/2017   Procedure: REMOVAL OF PORT A CATH & insertion PORT A CATH LEFT INTERNAL JUGULAR;  Surgeon: Herbert Pun, MD;  Location: [facility] ORS;  Service: General;  Laterality: N/A;   PORTACATH PLACEMENT N/A 04/15/2017   Procedure: INSERTION PORT-A-CATH;  Surgeon: Herbert Pun, MD;  Location: [facility] ORS;  Service: General;  Laterality: N/A;   SENTINEL NODE BIOPSY Right 10/14/2017   Procedure: SENTINEL NODE BIOPSY;  Surgeon: Herbert Pun, MD;  Location: [facility] ORS;  Service: General;  Laterality: Right;     FAMILY HISTORY: Family History  Problem Relation Age of Onset   Breast cancer Maternal Grandmother    Hypertension Maternal Grandmother    Angina Mother    Lung cancer Father    Diabetes Father    Prostate cancer Maternal Uncle    Dementia Maternal Grandfather    Congestive Heart Failure Maternal Grandfather    Heart disease Paternal Grandmother     ADVANCED DIRECTIVES (Y/N):  N  HEALTH MAINTENANCE: Social History   Tobacco Use   Smoking status: Former    Packs/day: 2.00    Years: 18.00    Pack years: 36.00    Types: Cigarettes    Quit date: 05/01/2017    Years since quitting: 3.9   Smokeless tobacco: Never   Tobacco comments:    quit 4 months ago  Vaping Use   Vaping Use: Never used  Substance Use Topics   Alcohol use: No   Drug use: No     Colonoscopy:  PAP:  Bone density:  Lipid panel:  Allergies  Allergen Reactions   Hydrocodone-Acetaminophen Itching and Rash   Penicillins Swelling, Other (See Comments) and Rash    Throat swelling Has patient had a PCN reaction causing immediate rash, facial/tongue/throat swelling, SOB or lightheadedness with hypotension: Yes Has patient had a PCN reaction causing severe rash involving mucus membranes or skin necrosis: No Has patient had a PCN reaction that required hospitalization: No Has patient had a PCN reaction occurring within the last 10 years: No If all of the above answers are "NO", then may proceed with Cephalosporin use.     Current Outpatient Medications  Medication Sig Dispense Refill   acetaminophen (TYLENOL) 500 MG tablet Take 1,000 mg by mouth every 8 (eight) hours as needed for mild pain.     bisoprolol-hydrochlorothiazide (ZIAC) 10-6.25 MG tablet Take 1 tablet by mouth daily.     hydrochlorothiazide (HYDRODIURIL) 12.5 MG tablet Take 1 tablet by mouth as needed. 1 tablet daily     ibuprofen (ADVIL,MOTRIN) 600 MG tablet   0   Investigational aspirin/placebo 300 MG tablet [facility] [id number] Take 1  tablet by mouth daily. Take with food or a full glass of water.  Do not crush enteric coated tablets. (Patient not taking: Reported on 03/07/2021) 180 tablet 0   Investigational aspirin/placebo 300 MG tablet Alliance [id number] Take 1 tablet by mouth daily. Take with food or a full glass of water.  Do not crush enteric coated tablets. (Patient not taking: Reported on 03/07/2021) 180 tablet 0   letrozole (FEMARA) 2.5 MG tablet Take 1 tablet (2.5 mg total) by mouth daily. 90 tablet 3   loratadine (CLARITIN) 10 MG tablet Take 10 mg by mouth daily as needed for allergies.     naproxen (NAPROSYN) 500 MG tablet Take 500 mg by mouth 2 (two) times daily.     No current facility-administered medications for this visit.    OBJECTIVE: There were no vitals filed for this visit.    There is no height or weight on file to calculate BMI.    ECOG FS:0 - Asymptomatic  Physical Exam Vitals reviewed.  Constitutional:      Appearance: She is obese.  Musculoskeletal:        General: No swelling.     Right lower leg: Edema present.     Left lower leg: Edema present.  Skin:    Findings: Rash (Between and under breast) present.     LAB RESULTS:  Lab Results  Component Value Date   NA 141 01/01/2021   K 3.9 01/01/2021   CL 104 01/01/2021   CO2 28 01/01/2021   GLUCOSE 88 01/01/2021   BUN 11 01/01/2021   CREATININE 0.98 01/01/2021   CALCIUM 9.3 01/01/2021   PROT 7.4 01/01/2021   ALBUMIN 3.7 01/01/2021   AST 28 01/01/2021   ALT 26 01/01/2021   ALKPHOS 125 01/01/2021   BILITOT 0.4 01/01/2021   GFRNONAA >60 01/01/2021   GFRAA >60 03/15/2020    Lab Results  Component Value Date   WBC 9.7 01/01/2021   NEUTROABS 5.6 01/01/2021   HGB 13.1 01/01/2021   HCT 39.9 01/01/2021   MCV 71.8 (L) 01/01/2021   PLT 242 01/01/2021     STUDIES: No results found.  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  PLAN:     1.  Stage Ib ER/PR positive HER2/neu negative right breast cancer- Patient completed neoadjuvant chemo with Adriamycin Cytoxan followed by 12 cycles of Taxol on 09/08/2017.  Underwent partial mastectomy on 10/14/2017.  She was noted to have residual disease so she proceeded with adjuvant XRT which she completed on 01/06/2018.  She is currently on letrozole and tolerating well except for hot flashes.  She will complete 5 years of letrozole in June 2024.  Most recent mammogram is from 10/17/2020 which was reported as BI-RADS Category 1.  We will repeat this in March 2023.  She is currently enrolled in 2 clinical trials-[id number] (randomized phase 2 trial evaluating the role of weight loss and adjuvant treatment of overweight obese woman with early breast cancer) and [id number] (randomized phase 3 double-blind placebo controlled trial of aspirin and adjuvant therapy for HER2 negative breast cancer-the ABC trial)  2.  Hot flashes:  Letrozole is likely contributing.  These are tolerable and she would like to continue.  3.  Bone health:  She is currently on letrozole.  Last bone density from 04/03/2020 revealed a T score of -1.0 which is improved from previous.  Repeat bone density is scheduled for 04/17/2021.  She is currently on calcium and vitamin D.  4.  Intertriginous candidiasis- Located under and between her breasts.  Recommend topical antifungal powder and cream.  Prescription sent.  Recommend she keep area clean and dry.  5.  Anxiety: Patient would really benefit from speaking to a Education officer, museum.  We will reach out to see if we still have somebody available on staff or make recommendations to her.  We will call her.  Disposition- Nystatin powder and cream sent to her pharmacy. Continue letrozole.  RTC on 04/17/2021 for repeat bone density.  RTC in 6 months after mammogram to review results with Dr. Grayland Ormond.  I spent 25 minutes dedicated to the care of this patient (face-to-face and non-face-to-face)  on the date of the encounter to include what is described in the assessment and plan.  Patient expressed understanding and was in agreement with this plan. She also understands that She can call clinic at any time with any questions, concerns, or complaints.  Cancer Staging Primary cancer of upper outer quadrant of right female breast [facility]) Staging form: Breast, AJCC 8th Edition - Clinical stage from 03/27/2017: Stage IB (cT1c, cN1, cM0, G2, ER+, PR+, HER2-) - Signed by Lloyd Huger, MD on 03/27/2017 Histologic grading system: 3 grade system Laterality: Right - Pathologic stage from 10/22/2017: No Stage Recommended (ypT1c, pN1a, cM0, G2, ER+, PR+, HER2-) - Signed by Lloyd Huger, MD on 10/22/2017 Stage prefix: Post-therapy Neoadjuvant therapy: Yes Histologic grading system: 3 grade system Laterality: Right   Jacquelin Hawking, NP   04/05/2021 8:33 AM

## 2021-04-05 NOTE — Progress Notes (Signed)
[facility] [phone]: RANDOMIZED PHASE III TRIAL EVALUATING THE ROLE OF WEIGHT LOSS IN ADJUVANT TREATMENT OF OVERWEIGHT AND OBESE WOMEN WITH EARLY BREAST CANCER  Patient arrived to clinic, alone, for scheduled lab and NP appointment and 57-month on study assessments.  I met with patient in exam room and introduced myself and informed her that I will be completing her 36 month assessment for the [facility] study. Patient reports to be doing well, she does state that she has some vertigo presently. Study assessments completed with weight, hip and waist measurements, as well as study questionnaires, as required by study at this time point. Yearly mammogram completed 10/17/2020. Patient was informed of new study Consent Addendum and this was reviewed with her.   Patient and I read over the Consent Addendum: Optional Long Term Outcomes Substudy [id number]) RANDOMIZED PHASE III TRIAL EVALUATING THE ROLE OF WEIGHT LOSS IN ADJUVANT TREATMENT OF OVERWEIGHT AND OBESE WOMEN WITH EARLY BREAST CANCER, Protocol Version Date: 11/07/20, together page by page. Patient understands and reads English. Patient confirms understanding to the purpose of the substudy, the voluntary nature of this substudy, what will be required from her; questionnaires and collection of blood sample at 5 or 6 years after enrolled on study, how her information will be protected, risks involved and that she will receive no direct benefits or compensation for participating in the substudy. Patient gave verbal understanding, that even if she chose not to participate in this substudy, she would still continue with the main BWEL study. After all of patient's questions were answered to her satisfaction and patient circled "YES" to both statements in the Substudy Consent Addendum. Patient then proceeded to sign, date and time where indicated. Patient was provided with a copy of the signed Consent Addendum for her records. Approximately 20 minutes was spent with  patient reviewing and consenting to this Consent Addendum.   Labs: No study labs required for this visit.  H&P: Patient met with NP, Faythe Casa, who addressed patient questions/needs. Study Questionnaires: Patient was provided questionnaires to complete. This nurse checked for completion and accuracy. Solicited Study Adverse Assessments: Reviewed with patient and patient denies any fractures, sprains, tendon or ligament injuries and confirms she has had no orthopedic surgery.  Physical assessments: Patient's weight, waist and hips were measured twice, and as instructed per protocol.  Weight (kg):   Waist circumference(cm)  Hip circumference (cm) 115.3    112.5     143.3 115.6   112.5     143.3  Plan: Patient denies any questions at this time. Patient was thanked for her time and continued support of study and was encouraged to call clinic and/or research for any questions or concerns she may. Patient to be followed annually, for study, going forward.   Maxwell Marion, RN, BSN, [facility] Clinical Research 04/05/2021 12:33 PM

## 2021-04-09 ENCOUNTER — Ambulatory Visit: Payer: Medicare HMO

## 2021-04-09 ENCOUNTER — Other Ambulatory Visit: Payer: Self-pay

## 2021-04-09 DIAGNOSIS — R2681 Unsteadiness on feet: Secondary | ICD-10-CM

## 2021-04-09 DIAGNOSIS — M6281 Muscle weakness (generalized): Secondary | ICD-10-CM

## 2021-04-09 DIAGNOSIS — R42 Dizziness and giddiness: Secondary | ICD-10-CM | POA: Diagnosis not present

## 2021-04-09 DIAGNOSIS — R2689 Other abnormalities of gait and mobility: Secondary | ICD-10-CM

## 2021-04-09 NOTE — Therapy (Signed)
[place] MAIN [facility] SERVICES [address] [place], [place], [address] Phone: [phone]   Fax:  [phone]  Physical Therapy Treatment  Patient Details  Name: Destiny Rogers MRN: [id number] Date of Birth: 05-07-1961 Referring Provider (PT): [place] Pink, MD   Encounter Date: 04/09/2021   PT End of Session - 04/09/21 0949     Visit Number 4    Number of Visits 8    Date for PT Re-Evaluation 04/09/21    Authorization Type [employer]    Authorization Time Period 03/12/21-05/07/21    PT Start Time 0830    PT Stop Time 0913    PT Time Calculation (min) 43 min    Equipment Utilized During Treatment Gait belt    Activity Tolerance Patient tolerated treatment well;No increased pain    Behavior During Therapy [facility] for tasks assessed/performed             Past Medical History:  Diagnosis Date   Anemia    Arthritis    Breast cancer ([place]) 2018   Right breast cancer- [facility]   Dyspnea    SINCE STARTING CHEMO WITH EXERTION   GERD (gastroesophageal reflux disease)    NO MEDS   Heart murmur    DX IN TEENS-ASYMPTOMATIC   Hypertension    Migraines    H/O MIGRAINES   Personal history of chemotherapy 2018   finished 09/08/17   Personal history of radiation therapy    Rt. breast   Thumb fracture    LEFT-WEARING BRACE    Past Surgical History:  Procedure Laterality Date   ABLATION  2006   BREAST BIOPSY Right 03/19/2017    [place] and mets in lymph node   BREAST LUMPECTOMY Right 10/14/2017   breast ca rad and chemo    CESAREAN SECTION     FRACTURE SURGERY Left 2013   left hand, thumb (screws and titanium)   INCISION AND DRAINAGE ABSCESS Right 04/26/2018   Procedure: INCISION AND DRAINAGE ABSCESS;  Surgeon: Herbert Pun, MD;  Location: [facility] ORS;  Service: General;  Laterality: Right;   PARTIAL MASTECTOMY WITH NEEDLE LOCALIZATION Right 10/14/2017   Procedure: PARTIAL MASTECTOMY WITH NEEDLE LOCALIZATION;  Surgeon: Herbert Pun, MD;  Location: [facility] ORS;  Service: General;  Laterality: Right;   PILONIDAL CYST EXCISION  1980'S   PORT-A-CATH REMOVAL N/A 04/27/2017   Procedure: REMOVAL OF PORT A CATH & insertion PORT A CATH LEFT INTERNAL JUGULAR;  Surgeon: Herbert Pun, MD;  Location: [facility] ORS;  Service: General;  Laterality: N/A;   PORTACATH PLACEMENT N/A 04/15/2017   Procedure: INSERTION PORT-A-CATH;  Surgeon: Herbert Pun, MD;  Location: [facility] ORS;  Service: General;  Laterality: N/A;   SENTINEL NODE BIOPSY Right 10/14/2017   Procedure: SENTINEL NODE BIOPSY;  Surgeon: Herbert Pun, MD;  Location: [facility] ORS;  Service: General;  Laterality: Right;    There were no vitals filed for this visit.   Subjective Assessment - 04/09/21 0832     Subjective Pt reports no dizziness lately. Denies no falls or near-falls, but did report some difficulty using an escalator. Pt reports gout flare but reports pain is OK currently.    Pertinent History Destiny Rogers is a 3yoF who presents with CC short-duration movement-mediated episodic dizziness. Pt reports primary aggravating factor as turning over in bed at night (bilat) or during day when lying down. Pt also has dizzness when coming from lying down to sitting EOB. Pt reports episodes are all related to movement, described as vertiginous,  all lasting less than 30sec duration. Pt denies any changes to hearing, no tinitus, denies any preceeding ear infection. Pt has since had bilat maxillary tooth ache and extraction which caused refarral ear ache at the time. Pt denies any falls, but does report self and family concerns of safety in walking outside. Per pt report geenral dizziness can be generated high visual, high head-movement scenarios presented in a busy grocery store aisle. PMH: Rt BrCA s/p radiation 2019, remote Lt wrist fracture, remore Rt thumb fracture, lumbar spine disease, depression.    Patient Stated Goals Be rid of symptoms of dizziness     Currently in Pain? No/denies             TREATMENT -     NuStep x 5 minutes performed on level 1 for cardiorespiratory and lower extremity muscular endurance; p cued to increase RPM to greater than 60.  Patient able to maintain RPM in 50s to 60s. (Unbilled)   Seated: VOR x1 with horizontal head turns -5 x 30 seconds each -Patient reports seeing double with initial set but no dizziness.  She reports no symptoms with the remaining sets. VOR x1 with vertical head turns -4 x 30 seconds each.  -- Patient reports minimal symptoms lasting a second and states that she feels "strange like a lava lamp," and reports last round that she is "seeing green sticky pads," however this resolves immediately once she completes exercise.  Reports no dizziness.  PT reinforces seated VORx1 with horizontal head turns HEP with new handout.  PT also adds single-leg balance to HEP.  Instruct patient to perform at support surface such as a counter with a chair behind her for 2 x 30 seconds sets for each lower extremity.  Instructs patient that this can be performed up to 7 times a week. HEP Access code [email or web address]   PT provides contact-guard assist for the following: STS from compliant surface 2x6, requires at least UUE support, minimal increase in sway, patient most limited by lower extremity endurance.  At support surface with contact-guard assist  SLB: 4 x 30 seconds each lower extremity.  Patient must use intermittent upper extremity support as exercises very challenging for patient  Standing on Airex narrow base of support by 30 seconds.  No decrease in postural stability does not need to use upper extremity support.  Standing on Airex narrow base of support eyes closed -4 x 30 seconds, slight increase in sway but no loss of balance or use of upper extremity support.  Patient reports feels easier.  Standing on Airex pad with narrow base of support with vertical and horizontal head turns -2 x 10 for each in  each direction.  No loss of balance, patient able to perform without upper extremity support.  Pt educated throughout session about proper posture and technique with exercises. Improved exercise technique, movement at target joints, use of target muscles after min to mod verbal, visual, tactile cues.    Note: Portions of this document were prepared using Dragon voice recognition software and although reviewed may contain unintentional dictation errors in syntax, grammar, or spelling.    PT Short Term Goals - 03/12/21 1002       PT SHORT TERM GOAL #1   Title Pt to improve FOTO score to >60 to demonstrate improved perception of ability in ADL/IADL.    Baseline Eval: 52/100    Time 4    Period Weeks    Status New    Target Date 04/09/21  PT SHORT TERM GOAL #2   Title Pt to demonstrate improved DHI score <40 to demonstrate reduced feelings of handicap.    Baseline Eval: DHI 42    Time 4    Period Weeks    Status New    Target Date 04/09/21      PT SHORT TERM GOAL #3   Title Pt to show improved score on ABC scale >82/100 to demonstrate improved confidence in balance.    Baseline Eval: ABC Scale 70/100    Time 4    Period Weeks    Status New    Target Date 04/09/21      PT SHORT TERM GOAL #4   Title Pt to report return to fitness/leisure walking in neighborhood streets as desired without concern of sustaining a fall.    Baseline At eval: avoiding AMB in street due to concerns of safety    Time 4    Period Weeks    Status New    Target Date 04/09/21                      Plan - 04/09/21 0951     Clinical Impression Statement Patient making gains towards therapy goals as indicated by reports of no dizziness since previous appointment.  Patient less symptomatic with VOR exercises, reporting only brief symptoms with initial bout of VORx1 with horizontal head turns and only 2 instances of sx increase with vertical head turns.  Patient also able to maintain balance  with eyes closed on compliant surface and with head turns on compliant surface without use of upper extremity support or assist from PT. this indicates improved use of vestibular cues to maintain balance.  Patient is still most challenged with single-leg balance tasks and is unable to maintain single-leg balance for 5 seconds on bilateral lower extremities.  PT reinforces VOR HEP (see note) and adds single-leg balance with counter support and chair behind patient to HEP.  The patient will benefit from further skilled PT to continue to improve the aforementioned deficits and her balance to decrease fall risk and improve QOL.    Personal Factors and Comorbidities Age    Examination-Activity Limitations Bed Mobility;Locomotion Level;Transfers;Squat;Stand    Examination-Participation Restrictions Yard Work;Cleaning    Stability/Clinical Decision Making Stable/Uncomplicated    Rehab Potential Excellent    PT Frequency 2x / week    PT Duration 4 weeks    PT Treatment/Interventions Canalith Repostioning;Neuromuscular re-education;Balance training;Therapeutic exercise;Therapeutic activities;Functional mobility training;Patient/family education;Vestibular;Visual/perceptual remediation/compensation    PT Next Visit Plan VOR exercises and balance training    PT Home Exercise Plan ?Access Code: [email or web address]; update 9/20: Access code [email or web address] and written instructions for SLB with counter support and chair behind pt to be performed 7x/week for 2x30 sec each LE.    Consulted and Agree with Plan of Care Patient             Patient will benefit from skilled therapeutic intervention in order to improve the following deficits and impairments:  Decreased activity tolerance, Decreased mobility, Improper body mechanics, Decreased knowledge of precautions, Difficulty walking, Dizziness, Impaired vision/preception  Visit Diagnosis: Unsteadiness on feet  Muscle weakness (generalized)  Other abnormalities of gait and  mobility     Problem List Patient Active Problem List   Diagnosis Date Noted   Cancer (HCC)    Hyperlipidemia, unspecified 08/04/2017   Goals of care, counseling/discussion 04/03/2017   Primary cancer of upper outer quadrant of right female breast ([facility]CC) 03/27/2017  Depression 12/15/2013   Essential hypertension 12/15/2013   Migraines 12/15/2013    Zollie Pee, PT 04/09/2021, 1:59 PM  [place] MAIN [facility] SERVICES [address] [place], [place], [address] Phone: [phone]   Fax:  [phone]  Name: Destiny Rogers MRN: [id number] Date of Birth: September 26, 1960

## 2021-04-11 ENCOUNTER — Ambulatory Visit: Payer: Medicare HMO

## 2021-04-11 ENCOUNTER — Other Ambulatory Visit: Payer: Self-pay

## 2021-04-11 ENCOUNTER — Encounter: Payer: Self-pay | Admitting: Oncology

## 2021-04-11 DIAGNOSIS — R2681 Unsteadiness on feet: Secondary | ICD-10-CM

## 2021-04-11 DIAGNOSIS — R42 Dizziness and giddiness: Secondary | ICD-10-CM | POA: Diagnosis not present

## 2021-04-11 NOTE — Therapy (Signed)
[place] MAIN [facility] SERVICES [address] [place], [place], [address] Phone: [phone]   Fax:  [phone]  Physical Therapy Treatment/RECERT  Patient Details  Name: Destiny Rogers MRN: [id number] Date of Birth: 1960/09/16 Referring Provider (PT): Maryland Pink, MD   Encounter Date: 04/11/2021   PT End of Session - 04/11/21 1536     Visit Number 5    Number of Visits 13    Date for PT Re-Evaluation 05/09/21    Authorization Type [employer] Medicare    Authorization Time Period 03/12/21-05/07/21    PT Start Time 0830    PT Stop Time 0914    PT Time Calculation (min) 44 min    Equipment Utilized During Treatment Gait belt    Activity Tolerance Patient tolerated treatment well    Behavior During Therapy [facility] for tasks assessed/performed             Past Medical History:  Diagnosis Date   Anemia    Arthritis    Breast cancer ([place]) 2018   Right breast cancer- [facility]   Dyspnea    SINCE STARTING CHEMO WITH EXERTION   GERD (gastroesophageal reflux disease)    NO MEDS   Heart murmur    DX IN TEENS-ASYMPTOMATIC   Hypertension    Migraines    H/O MIGRAINES   Personal history of chemotherapy 2018   finished 09/08/17   Personal history of radiation therapy    Rt. breast   Thumb fracture    LEFT-WEARING BRACE    Past Surgical History:  Procedure Laterality Date   ABLATION  2006   BREAST BIOPSY Right 03/19/2017    [place] and mets in lymph node   BREAST LUMPECTOMY Right 10/14/2017   breast ca rad and chemo    CESAREAN SECTION     FRACTURE SURGERY Left 2013   left hand, thumb (screws and titanium)   INCISION AND DRAINAGE ABSCESS Right 04/26/2018   Procedure: INCISION AND DRAINAGE ABSCESS;  Surgeon: Herbert Pun, MD;  Location: [facility] ORS;  Service: General;  Laterality: Right;   PARTIAL MASTECTOMY WITH NEEDLE LOCALIZATION Right 10/14/2017   Procedure: PARTIAL MASTECTOMY WITH NEEDLE LOCALIZATION;  Surgeon: Herbert Pun, MD;   Location: [facility];  Service: General;  Laterality: Right;   PILONIDAL CYST EXCISION  1980'S   PORT-A-CATH REMOVAL N/A 04/27/2017   Procedure: REMOVAL OF PORT A CATH & insertion PORT A CATH LEFT INTERNAL JUGULAR;  Surgeon: Herbert Pun, MD;  Location: [facility] ORS;  Service: General;  Laterality: N/A;   PORTACATH PLACEMENT N/A 04/15/2017   Procedure: INSERTION PORT-A-CATH;  Surgeon: Herbert Pun, MD;  Location: [facility] ORS;  Service: General;  Laterality: N/A;   SENTINEL NODE BIOPSY Right 10/14/2017   Procedure: SENTINEL NODE BIOPSY;  Surgeon: Herbert Pun, MD;  Location: [facility] ORS;  Service: General;  Laterality: Right;    There were no vitals filed for this visit.   Subjective Assessment - 04/11/21 1535     Subjective Patient reports she has not had any dizzy episodes and denies any falls or near falls.    Pertinent History Destiny Rogers is a 80yoF who presents with CC short-duration movement-mediated episodic dizziness. Pt reports primary aggravating factor as turning over in bed at night (bilat) or during day when lying down. Pt also has dizzness when coming from lying down to sitting EOB. Pt reports episodes are all related to movement, described as vertiginous, all lasting less than 30sec duration. Pt denies any changes to hearing, no tinitus,  denies any preceeding ear infection. Pt has since had bilat maxillary tooth ache and extraction which caused refarral ear ache at the time. Pt denies any falls, but does report self and family concerns of safety in walking outside. Per pt report geenral dizziness can be generated high visual, high head-movement scenarios presented in a busy grocery store aisle. PMH: Rt BrCA s/p radiation 2019, remote Lt wrist fracture, remore Rt thumb fracture, lumbar spine disease, depression.    Patient Stated Goals Be rid of symptoms of dizziness    Currently in Pain? No/denies            TREATMENT -    Reassessment of goals (see goal section  for full details) DHI: score 10 (achieved) FOTO: 70 (achieved) ABC Scale: 64.38 (decreased)   Standing, VOR x1, busy background, with horizontal and vertical head turns -4 x 30-second bouts for each condition.  Patient reports no symptoms with all vertical head turn sets.  Patient reports 1 instance of minimal symptoms with initial horizontal head turns set but reports it resolves quickly.  No decrease in postural stability observed.  Dynamic walking hallway with patient reading sticky notes placed on walls to incorporate horizontal and vertical head turns-  2x down length of hall. Patient admits only 1 instance of needing to use step strategy to improve postural stability with horizontal head turns.  She reports no visual symptoms.  Dynamic walking down hallway with purely vertical head turns -2 times length of hallway.  No symptoms no decreased postural stability  At support surface SLB -2 x 30 seconds each lower extremity.  Patient maintains balance for 30 seconds on right lower extremity, has great difficulty maintaining balance with left lower extremity.  Patient must use intermittent upper extremity support with left lower extremity as stance leg. Modified SLB f with left lower extremity on Airex pad and right lower extremity on 6 inch step -30 seconds.  Patient able to perform without support  PT advances patient's HEP to standing VOR horizontal head turns with busy background to be performed at home with instructions to perform with support surface in front of patient and chair behind patient.  PT provides patient with electronic form of handout from [place] sent securely over email with patient's permission.  Patient instructed to perform in 30 seconds bouts for 3 minutes daily.  Patient instructed not to perform if she feels increasingly unsteady or unsafe.  In that instance, patient instructed to go back to seated version.  Patient verbalizes understanding for all.    Pt educated  throughout session about proper posture and technique with exercises. Improved exercise technique, movement at target joints, use of target muscles after min to mod verbal, visual, tactile cues.       Note: Portions of this document were prepared using Dragon voice recognition software and although reviewed may contain unintentional dictation errors in syntax, grammar, or spelling.    PT Education - 04/11/21 1536     Education Details Reassessment findings, indications for plan of care    Person(s) Educated Patient    Methods Explanation;Handout;Demonstration;Verbal cues    Comprehension Verbalized understanding;Returned demonstration              PT Short Term Goals - 04/11/21 0827       PT SHORT TERM GOAL #1   Title Pt to improve FOTO score to >60 to demonstrate improved perception of ability in ADL/IADL.    Baseline Eval: 52/100; 9/22: 70    Time 4  Period Weeks    Status Achieved    Target Date 05/09/21      PT SHORT TERM GOAL #2   Title Pt to demonstrate improved DHI score <40 to demonstrate reduced feelings of handicap.    Baseline Eval: DHI 42; 9/22: 10    Time 4    Period Weeks    Status Achieved    Target Date 04/09/21      PT SHORT TERM GOAL #3   Title Pt to show improved score on ABC scale >82/100 to demonstrate improved confidence in balance.    Baseline Eval: ABC Scale 70/100; 9/22: 64.38    Time 4    Period Weeks    Status On-going    Target Date 05/09/21      PT SHORT TERM GOAL #4   Title Pt to report return to fitness/leisure walking in neighborhood streets as desired without concern of sustaining a fall.    Baseline At eval: avoiding AMB in street due to concerns of safety; 9/22: Patient reports intention to start walking again with her son now that weather has improved, and feels she does not really experience much issue walking unless in a very crowded environment    Time 4    Period Weeks    Status Partially Met    Target Date 05/09/21                       Plan - 04/11/21 1553     Clinical Impression Statement Goals reassessed for recertification.  Patient has achieved Foto goal (70) and DHI goal (score 10), indicating improved functional mobility, quality of life and no handicap due to dizziness symptoms.  She also reports resolution of dizziness symptoms.  Patient did see slight decrease in ABC scale score.  However, she reports improvement in overall functional mobility and confidence in walking in various environments.  The patient does indicate that busy environments do still increase visual disturbance symptoms but does not decrease her balance.  The patient was able to progress to VOR exercise to standing with busy background, and exhibited no decrease in postural stability.  Similarly, she was able to perform walking with head turns with only 1 instance of requiring a stepping strategy where she was independently able to regain her balance.  Patient is still most challenged with left lower extremity single-leg balance.  Due to her progress and resolution of dizziness, patient will be taking a 2-week trial period outside of PT to perform her HEP and monitor her symptoms.  Patient to follow-up again in 2 weeks to determine if patient is ready for discharge.  Patient will benefit from further skilled PT to continue to improve balance in order to decrease fall.    Personal Factors and Comorbidities Age    Examination-Activity Limitations Bed Mobility;Locomotion Level;Transfers;Squat;Stand    Examination-Participation Restrictions Yard Work;Cleaning    Stability/Clinical Decision Making Stable/Uncomplicated    Rehab Potential Excellent    PT Frequency 2x / week    PT Duration 4 weeks    PT Treatment/Interventions Canalith Repostioning;Neuromuscular re-education;Balance training;Therapeutic exercise;Therapeutic activities;Functional mobility training;Patient/family education;Vestibular;Visual/perceptual  remediation/compensation    PT Next Visit Plan VOR exercises and balance training    PT Home Exercise Plan ?Access Code: [email or web address]; update 9/20: Access code [email or web address] and written instructions for SLB with counter support and chair behind pt to be performed 7x/week for 2x30 sec each LE.  9/22: PT provides patient with electronic handout, sent over secure email with patient's permission,  of standing VOR x1 with horizontal head turns and busy background (perfomred in 30 sec bouts for up to 3 min daily).  Instruction to perform at support surface and with chair behind her.  Patient also instructed to discontinue if feeling increasingly unsteady and return to seated version.  Pt verbalizes understanding    Consulted and Agree with Plan of Care Patient             Patient will benefit from skilled therapeutic intervention in order to improve the following deficits and impairments:  Decreased activity tolerance, Decreased mobility, Improper body mechanics, Decreased knowledge of precautions, Difficulty walking, Dizziness, Impaired vision/preception  Visit Diagnosis: Unsteadiness on feet     Problem List Patient Active Problem List   Diagnosis Date Noted   Cancer ([place])    Hyperlipidemia, unspecified 08/04/2017   Goals of care, counseling/discussion 04/03/2017   Primary cancer of upper outer quadrant of right female breast ([place]) 03/27/2017   Depression 12/15/2013   Essential hypertension 12/15/2013   Migraines 12/15/2013    Zollie Pee, PT 04/11/2021, 4:00 PM  [place] [address] [place], [place], [address] Phone: [phone]   Fax:  [phone]  Name: Destiny Rogers MRN: [id number] Date of Birth: September 28, 1960

## 2021-04-15 ENCOUNTER — Other Ambulatory Visit: Payer: Self-pay | Admitting: Oncology

## 2021-04-15 ENCOUNTER — Encounter: Payer: Self-pay | Admitting: Oncology

## 2021-04-16 ENCOUNTER — Ambulatory Visit: Payer: Medicare HMO

## 2021-04-16 ENCOUNTER — Encounter: Payer: Self-pay | Admitting: Oncology

## 2021-04-17 ENCOUNTER — Other Ambulatory Visit: Payer: Self-pay

## 2021-04-17 ENCOUNTER — Ambulatory Visit
Admission: RE | Admit: 2021-04-17 | Discharge: 2021-04-17 | Disposition: A | Discharge status: Home / Self Care | Payer: Medicare HMO | Source: Ambulatory Visit | Attending: Oncology | Admitting: Oncology

## 2021-04-17 DIAGNOSIS — C50411 Malignant neoplasm of upper-outer quadrant of right female breast: Secondary | ICD-10-CM | POA: Diagnosis not present

## 2021-04-17 DIAGNOSIS — Z79811 Long term (current) use of aromatase inhibitors: Secondary | ICD-10-CM | POA: Insufficient documentation

## 2021-04-18 ENCOUNTER — Ambulatory Visit: Payer: Medicare HMO

## 2021-04-23 ENCOUNTER — Ambulatory Visit: Payer: Medicare HMO

## 2021-04-25 ENCOUNTER — Ambulatory Visit: Payer: Medicare HMO

## 2021-04-30 ENCOUNTER — Ambulatory Visit: Payer: Medicare HMO

## 2021-05-02 ENCOUNTER — Ambulatory Visit: Payer: Medicare HMO

## 2021-05-07 ENCOUNTER — Ambulatory Visit: Payer: Medicare HMO

## 2021-05-09 ENCOUNTER — Ambulatory Visit: Payer: Medicare HMO

## 2021-05-14 ENCOUNTER — Ambulatory Visit: Payer: Medicare HMO

## 2021-05-16 ENCOUNTER — Ambulatory Visit: Payer: Medicare HMO

## 2021-05-21 ENCOUNTER — Ambulatory Visit: Payer: Medicare HMO

## 2021-05-23 ENCOUNTER — Ambulatory Visit: Payer: Medicare HMO

## 2021-05-28 ENCOUNTER — Ambulatory Visit: Payer: Medicare HMO

## 2021-05-30 ENCOUNTER — Ambulatory Visit: Payer: Medicare HMO

## 2021-06-04 ENCOUNTER — Ambulatory Visit: Payer: Medicare HMO

## 2021-06-06 ENCOUNTER — Ambulatory Visit: Payer: Medicare HMO

## 2021-06-11 ENCOUNTER — Ambulatory Visit: Payer: Medicare HMO

## 2021-06-18 ENCOUNTER — Ambulatory Visit: Payer: Medicare HMO

## 2021-06-20 ENCOUNTER — Ambulatory Visit: Payer: Medicare HMO

## 2021-09-13 ENCOUNTER — Encounter: Payer: Self-pay | Admitting: Oncology

## 2021-09-13 NOTE — Telephone Encounter (Signed)
Signing encounter see previous note on 09/27/20

## 2021-10-06 NOTE — Progress Notes (Deleted)
?[facility]  ?[phone] [phone] ? ?ID: Destiny Rogers OB: 1960/09/27  MR#: [id number]  BEM#:[id number] ? ?Patient Care Team: ?Maryland Pink, MD as PCP - General (Family Medicine) ?Rico Junker, RN as Registered Nurse ?Lloyd Huger, MD as Consulting Physician (Oncology) ?Jacquelin Hawking, NP as Nurse Practitioner (Oncology) ?Herbert Pun, MD as Consulting Physician (General Surgery) ?Noreene Filbert, MD as Referring Physician (Radiation Oncology) ? ? 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 ? ?INTERVAL HISTORY: Patient returns to clinic today for routine 67-monthevaluation.  She continues to have periodic hot flashes from letrozole, but states these are tolerable and do not affect her day-to-day activity.  She otherwise feels well.  She has no neurologic complaints.  She does not complain of weakness or fatigue today.  She has a good appetite and denies weight loss.  She denies any chest pain, shortness of breath, cough, or hemoptysis.  She denies any nausea, vomiting, constipation, or diarrhea. She has no urinary complaints.  Patient offers no further specific complaints today. ? ?REVIEW OF SYSTEMS:   ?Review of Systems  ?Constitutional: Negative.  Negative for fever, malaise/fatigue and weight loss.  ?Respiratory: Negative.  Negative for cough and shortness of breath.   ?Cardiovascular: Negative.  Negative for chest pain and leg swelling.  ?Gastrointestinal: Negative.  Negative for abdominal pain.  ?Genitourinary: Negative.  Negative for dysuria and flank pain.  ?Musculoskeletal: Negative.  Negative for back pain and joint pain.  ?Skin: Negative.  Negative for itching and rash.  ?Neurological:  Positive for sensory change. Negative for tingling, focal weakness and weakness.  ?Psychiatric/Behavioral: Negative.  The patient is not nervous/anxious and  does not have insomnia.   ? ?As per HPI. Otherwise, a complete review of systems is negative. ? ?PAST MEDICAL HISTORY: ?Past Medical History:  ?Diagnosis Date  ? Anemia   ? Arthritis   ? Breast cancer ([place] 2018  ? Right breast [facility]- [facility]  ? Dyspnea   ? SINCE STARTING CHEMO WITH EXERTION  ? GERD (gastroesophageal reflux disease)   ? NO MEDS  ? Heart murmur   ? DX IN TEENS-ASYMPTOMATIC  ? Hypertension   ? Migraines   ? H/O MIGRAINES  ? Personal history of chemotherapy 2018  ? finished 09/08/17  ? Personal history of radiation therapy   ? Rt. breast  ? Thumb fracture   ? LEFT-WEARING BRACE  ? ? ?PAST SURGICAL HISTORY: ?Past Surgical History:  ?Procedure Laterality Date  ? ABLATION  2006  ? [place]  [place]  ? BREAST LUMPECTOMY Right 10/14/2017  ? breast ca rad and chemo   ? CESAREAN SECTION    ? FRACTURE SURGERY Left 2013  ? left hand, thumb (screws and titanium)  ? INCISION AND DRAINAGE ABSCESS Right 04/26/2018  ? Procedure: INCISION AND DRAINAGE ABSCESS;  Surgeon: CHerbert Pun MD;  Location: [facility] OR[facility];  Service: General;  Laterality: Right;  ? PARTIAL MASTECTOMY WITH NEEDLE LOCALIZATION Right 10/14/2017  ? Procedure: PARTIAL MASTECTOMY WITH NEEDLE LOCALIZATION;  Surgeon: CHerbert Pun MD;  Location: [facility];  Service: General;  Laterality: Right;  ? PILONIDAL CYST EXCISION  1980'S  ? PORT-A-CATH REMOVAL N/A 04/27/2017  ? Procedure: REMOVAL OF PORT A CATH & insertion PORT A CATH LEFT INTERNAL JUGULAR;  Surgeon: CHerbert Pun MD;  Location: [facility] OR[facility];  Service: General;  Laterality: N/A;  ? PORTACATH  PLACEMENT N/A 04/15/2017  ? Procedure: INSERTION PORT-A-CATH;  Surgeon: Herbert Pun, MD;  Location: [facility];  Service: General;  Laterality: N/A;  ? SENTINEL NODE BIOPSY Right 10/14/2017  ? Procedure: SENTINEL NODE BIOPSY;  Surgeon: Herbert Pun, MD;  Location: [facility];  Service: General;  Laterality: Right;  ? ? ?FAMILY  HISTORY: ?Family History  ?Problem Relation Age of Onset  ? Breast cancer Maternal Grandmother   ? Hypertension Maternal Grandmother   ? Angina Mother   ? Lung cancer Father   ? Diabetes Father   ? Prostate cancer Maternal Uncle   ? Dementia Maternal Grandfather   ? Congestive Heart Failure Maternal Grandfather   ? Heart disease Paternal Grandmother   ? ? ?ADVANCED DIRECTIVES (Y/N):  N ? ?HEALTH MAINTENANCE: ?Social History  ? ?Tobacco Use  ? Smoking status: Former  ?  Packs/day: 2.00  ?  Years: 18.00  ?  Pack years: 36.00  ?  Types: Cigarettes  ?  Quit date: 05/01/2017  ?  Years since quitting: 4.4  ? Smokeless tobacco: Never  ? Tobacco comments:  ?  quit 4 months ago  ?Vaping Use  ? Vaping Use: Never used  ?Substance Use Topics  ? Alcohol use: No  ? Drug use: No  ? ? ? Colonoscopy: ? PAP: ? Bone density: ? Lipid panel: ? ?Allergies  ?Allergen Reactions  ? Hydrocodone-Acetaminophen Itching and Rash  ? Penicillins Swelling, Other (See Comments) and Rash  ?  Throat swelling ?Has patient had a PCN reaction causing immediate rash, facial/tongue/throat swelling, SOB or lightheadedness with hypotension: Yes ?Has patient had a PCN reaction causing severe rash involving mucus membranes or skin necrosis: No ?Has patient had a PCN reaction that required hospitalization: No ?Has patient had a PCN reaction occurring within the last 10 years: No ?If all of the above answers are "NO", then may proceed with Cephalosporin use. ?  ? ? ?Current Outpatient Medications  ?Medication Sig Dispense Refill  ? acetaminophen (TYLENOL) 500 MG tablet Take 1,000 mg by mouth every 8 (eight) hours as needed for mild pain.    ? allopurinol (ZYLOPRIM) 300 MG tablet Take 300 mg by mouth daily.    ? bisoprolol-hydrochlorothiazide (ZIAC) 10-6.25 MG tablet Take 1 tablet by mouth daily.    ? hydrochlorothiazide (HYDRODIURIL) 12.5 MG tablet Take 1 tablet by mouth as needed. 1 tablet daily    ? ibuprofen (ADVIL,MOTRIN) 600 MG tablet   0  ? Investigational  aspirin/placebo 300 MG tablet [facility] [id number] Take 1 tablet by mouth daily. Take with food or a full glass of water.  Do not crush enteric coated tablets. (Patient not taking: No sig reported) 180 tablet 0  ? Investigational aspirin/placebo 300 MG tablet [facility] [id number] Take 1 tablet by mouth daily. Take with food or a full glass of water.  Do not crush enteric coated tablets. (Patient not taking: No sig reported) 180 tablet 0  ? letrozole (FEMARA) 2.5 MG tablet Take 1 tablet (2.5 mg total) by mouth daily. (Patient not taking: Reported on 04/05/2021) 90 tablet 3  ? loratadine (CLARITIN) 10 MG tablet Take 10 mg by mouth daily as needed for allergies.    ? naproxen (NAPROSYN) 500 MG tablet Take 500 mg by mouth 2 (two) times daily.    ? nystatin (MYCOSTATIN/NYSTOP) powder Apply 1 application topically 3 (three) times daily. 15 g 0  ? nystatin-triamcinolone ointment (MYCOLOG) APPLY TO AFFECTED AREA TWICE A DAY 30 g 0  ? ?No current facility-administered medications for  this visit.  ? ? ?OBJECTIVE: ?There were no vitals filed for this visit. ?   There is no height or weight on file to calculate BMI.    ECOG FS:0 - Asymptomatic ? ?General: Well-developed, well-nourished, no acute distress. ?Eyes: Pink conjunctiva, anicteric sclera. ?HEENT: Normocephalic, moist mucous membranes. ?Breasts: Exam deferred today. ?Lungs: No audible wheezing or coughing. ?Heart: Regular rate and rhythm. ?Abdomen: Soft, nontender, no obvious distention. ?Musculoskeletal: No edema, cyanosis, or clubbing. ?Neuro: Alert, answering all questions appropriately. Cranial nerves grossly intact. ?Skin: No rashes or petechiae noted. ?Psych: Normal affect. ? ? ?LAB RESULTS: ? ?Lab Results  ?Component Value Date  ? NA 139 04/05/2021  ? K 3.6 04/05/2021  ? CL 100 04/05/2021  ? CO2 28 04/05/2021  ? GLUCOSE 180 (H) 04/05/2021  ? BUN 16 04/05/2021  ? CREATININE 0.95 04/05/2021  ? CALCIUM 9.0 04/05/2021  ? PROT 7.4 04/05/2021  ? ALBUMIN 3.8 04/05/2021  ? AST 22  04/05/2021  ? ALT 22 04/05/2021  ? ALKPHOS 138 (H) 04/05/2021  ? BILITOT 0.4 04/05/2021  ? GFRNONAA >60 04/05/2021  ? GFRAA >60 03/15/2020  ? ? ?Lab Results  ?Component Value Date  ? WBC 8.0 04/05/2021  ? NEUTROABS

## 2021-10-08 ENCOUNTER — Inpatient Hospital Stay: Payer: Medicare HMO | Admitting: Oncology

## 2021-10-08 DIAGNOSIS — C50411 Malignant neoplasm of upper-outer quadrant of right female breast: Secondary | ICD-10-CM

## 2021-11-11 ENCOUNTER — Other Ambulatory Visit: Payer: Self-pay | Admitting: Oncology

## 2021-11-13 ENCOUNTER — Ambulatory Visit
Admission: RE | Admit: 2021-11-13 | Discharge: 2021-11-13 | Disposition: A | Discharge status: Home / Self Care | Payer: Medicare HMO | Source: Ambulatory Visit | Attending: Oncology | Admitting: Oncology

## 2021-11-13 DIAGNOSIS — C50411 Malignant neoplasm of upper-outer quadrant of right female breast: Secondary | ICD-10-CM | POA: Diagnosis present

## 2021-11-13 DIAGNOSIS — Z1231 Encounter for screening mammogram for malignant neoplasm of breast: Secondary | ICD-10-CM | POA: Diagnosis not present

## 2021-11-19 ENCOUNTER — Inpatient Hospital Stay: Payer: Medicare HMO | Attending: Oncology | Admitting: Medical Oncology

## 2021-11-19 VITALS — BP 111/84 | HR 104 | Resp 18 | Wt 244.0 lb

## 2021-11-19 DIAGNOSIS — N951 Menopausal and female climacteric states: Secondary | ICD-10-CM | POA: Diagnosis not present

## 2021-11-19 DIAGNOSIS — Z79811 Long term (current) use of aromatase inhibitors: Secondary | ICD-10-CM | POA: Insufficient documentation

## 2021-11-19 DIAGNOSIS — C50411 Malignant neoplasm of upper-outer quadrant of right female breast: Secondary | ICD-10-CM | POA: Diagnosis present

## 2021-11-19 DIAGNOSIS — Z170 Estrogen receptor positive status [ER+]: Secondary | ICD-10-CM | POA: Insufficient documentation

## 2021-11-19 NOTE — Progress Notes (Signed)
?[facility] [facility]  ?[phone]) [phone] [phone] ? ?ID: Destiny Rogers OB: 06/18/1961  MR#: [id number]  DQQ#:[id number] ? ?Patient Care Team: ?Mary[place] Pink, MD as PCP - General (Family Medicine) ?Rico Junker, RN as Registered Nurse ?Lloyd Huger, MD as Consulting Physician (Oncology) ?Jacquelin Hawking, NP as Nurse Practitioner (Oncology) ?Herbert Pun, MD as Consulting Physician (General Surgery) ?Noreene Filbert, MD as Referring Physician (Radiation Oncology) ? ? 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 ? ?History of present illness- ?Destiny Rogers is a 61 year old female with past medical history significant for hypertension, migraines, depression, hyperlipidemia who is followed Dr. Grayland Ormond for stage Ib ER/PR positive HER2/neu negative right breast cancer.  She completed 4 cycles of Adriamycin/Cytoxan followed by 12 cycles of Taxol on 09/08/2017.  She completed adjuvant XRT on 01/07/2018.  She is now on letrozole. ? ?INTERVAL HISTORY: Destiny Rogers is doing well today. She recently had a mammogram to which she is eager to hear the results. Still has mild hot flashes but wishes to continue on her medication. Continues to have some chronic but not worsened brain fog. Not taking a calcium or vitamin D supplement.  ? ?REVIEW OF SYSTEMS:   ?Review of Systems  ?Gastrointestinal:  Negative for constipation.  ?Musculoskeletal:  Negative for joint pain.  ?Skin:  Negative for itching and rash.  ?Psychiatric/Behavioral:  Negative for memory loss. The patient is not nervous/anxious.   ?All other systems reviewed and are negative. ? ?As per HPI. Otherwise, a complete review of systems is negative. ? ?PAST MEDICAL HISTORY: ?Past Medical History:  ?Diagnosis Date  ? Anemia   ? Arthritis   ? Breast cancer ([place]) 2018  ? Right breast cancer- [facility]  ? Dyspnea   ? SINCE STARTING CHEMO WITH  EXERTION  ? GERD (gastroesophageal reflux disease)   ? NO MEDS  ? Heart murmur   ? DX IN TEENS-ASYMPTOMATIC  ? Hypertension   ? Migraines   ? H/O MIGRAINES  ? Personal history of chemotherapy 2018  ? finished 09/08/17  ? Personal history of radiation therapy   ? Rt. breast  ? Thumb fracture   ? LEFT-WEARING BRACE  ? ? ?PAST SURGICAL HISTORY: ?Past Surgical History:  ?Procedure Laterality Date  ? ABLATION  2006  ? BREAST BIOPSY Right 03/19/2017  ?  [place] and mets in lymph node  ? BREAST LUMPECTOMY Right 10/14/2017  ? breast ca rad and chemo   ? CESAREAN SECTION    ? FRACTURE SURGERY Left 2013  ? left hand, thumb (screws and titanium)  ? INCISION AND DRAINAGE ABSCESS Right 04/26/2018  ? Procedure: INCISION AND DRAINAGE ABSCESS;  Surgeon: Herbert Pun, MD;  Location: [facility];  Service: General;  Laterality: Right;  ? PARTIAL MASTECTOMY WITH NEEDLE LOCALIZATION Right 10/14/2017  ? Procedure: PARTIAL MASTECTOMY WITH NEEDLE LOCALIZATION;  Surgeon: Herbert Pun, MD;  Location: [facility];  Service: General;  Laterality: Right;  ? PILONIDAL CYST EXCISION  1980'S  ? PORT-A-CATH REMOVAL N/A 04/27/2017  ? Procedure: REMOVAL OF PORT A CATH & insertion PORT A CATH LEFT INTERNAL JUGULAR;  Surgeon: Herbert Pun, MD;  Location: [facility];  Service: General;  Laterality: N/A;  ? PORTACATH PLACEMENT N/A 04/15/2017  ? Procedure: INSERTION PORT-A-CATH;  Surgeon: Herbert Pun, MD;  Location: [facility];  Service: General;  Laterality: N/A;  ? SENTINEL NODE BIOPSY Right 10/14/2017  ? Procedure: SENTINEL NODE BIOPSY;  Surgeon: Herbert Pun, MD;  Location: [facility];  Service: General;  Laterality: Right;  ? ? ?FAMILY HISTORY: ?Family History  ?Problem Relation Age of Onset  ? Breast cancer Maternal Grandmother   ? Hypertension Maternal Grandmother   ? Angina Mother   ? Lung cancer Father   ? Diabetes Father   ? Prostate cancer Maternal Uncle   ? Dementia Maternal Grandfather   ? Congestive Heart Failure  Maternal Grandfather   ? Heart disease Paternal Grandmother   ? ? ?ADVANCED DIRECTIVES (Y/N):  N ? ?HEALTH MAINTENANCE: ?Social History  ? ?Tobacco Use  ? Smoking status: Former  ?  Packs/day: 2.00  ?  Years: 18.00  ?  Pack years: 36.00  ?  Types: Cigarettes  ?  Quit date: 05/01/2017  ?  Years since quitting: 4.5  ? Smokeless tobacco: Never  ? Tobacco comments:  ?  quit 4 months ago  ?Vaping Use  ? Vaping Use: Never used  ?Substance Use Topics  ? Alcohol use: No  ? Drug use: No  ? ? ? Colonoscopy: ? PAP: ? Bone density: ? Lipid panel: ? ?Allergies  ?Allergen Reactions  ? Hydrocodone-Acetaminophen Itching and Rash  ? Penicillins Swelling, Other (See Comments) and Rash  ?  Throat swelling ?Has patient had a PCN reaction causing immediate rash, facial/tongue/throat swelling, SOB or lightheadedness with hypotension: Yes ?Has patient had a PCN reaction causing severe rash involving mucus membranes or skin necrosis: No ?Has patient had a PCN reaction that required hospitalization: No ?Has patient had a PCN reaction occurring within the last 10 years: No ?If all of the above answers are "NO", then may proceed with Cephalosporin use. ?  ? ? ?Current Outpatient Medications  ?Medication Sig Dispense Refill  ? acetaminophen (TYLENOL) 500 MG tablet Take 1,000 mg by mouth every 8 (eight) hours as needed for mild pain.    ? allopurinol (ZYLOPRIM) 300 MG tablet Take 300 mg by mouth daily.    ? colchicine 0.6 MG tablet Take by mouth.    ? letrozole (FEMARA) 2.5 MG tablet TAKE 1 TABLET BY MOUTH EVERY DAY 90 tablet 1  ? metoprolol succinate (TOPROL-XL) 50 MG 24 hr tablet Take 1 tablet by mouth daily.    ? PARoxetine (PAXIL) 20 MG tablet Take 20 mg by mouth daily.    ? ALPRAZolam (XANAX) 0.25 MG tablet Take 0.25 mg by mouth daily as needed. (Patient not taking: Reported on 11/19/2021)    ? hydrochlorothiazide (HYDRODIURIL) 12.5 MG tablet Take 1 tablet by mouth as needed. 1 tablet daily (Patient not taking: Reported on 11/19/2021)    ?  ibuprofen (ADVIL,MOTRIN) 600 MG tablet  (Patient not taking: Reported on 11/19/2021)  0  ? Investigational aspirin/placebo 300 MG tablet [facility] [id number] Take 1 tablet by mouth daily. Take with food or a full glass of water.  Do not crush enteric coated tablets. (Patient not taking: Reported on 03/07/2021) 180 tablet 0  ? Investigational aspirin/placebo 300 MG tablet [facility] [id number] Take 1 tablet by mouth daily. Take with food or a full glass of water.  Do not crush enteric coated tablets. (Patient not taking: Reported on 03/07/2021) 180 tablet 0  ? loratadine (CLARITIN) 10 MG tablet Take 10 mg by mouth daily as needed for allergies. (Patient not taking: Reported on 11/19/2021)    ? naproxen (NAPROSYN) 500 MG tablet Take 500 mg by mouth 2 (two) times daily. (Patient not taking: Reported on 11/19/2021)    ? nystatin (MYCOSTATIN/NYSTOP) powder Apply 1 application topically 3 (three) times  daily. (Patient not taking: Reported on 11/19/2021) 15 g 0  ? nystatin-triamcinolone ointment (MYCOLOG) APPLY TO AFFECTED AREA TWICE A DAY (Patient not taking: Reported on 11/19/2021) 30 g 0  ? ?No current facility-administered medications for this visit.  ? ? ?OBJECTIVE: ?Vitals:  ? 11/19/21 1047  ?BP: 111/84  ?Pulse: (!) 104  ?Resp: 18  ?SpO2: 98%  ? ?   Body mass index is 41.24 kg/m?Marland Kitchen    ECOG FS:0 - Asymptomatic ? ?Physical Exam ?Vitals reviewed.  ?Constitutional:   ?   Appearance: Normal appearance. She is obese.  ?Cardiovascular:  ?   Rate and Rhythm: Normal rate and regular rhythm.  ?   Heart sounds: Normal heart sounds.  ?Pulmonary:  ?   Effort: Pulmonary effort is normal.  ?   Breath sounds: Normal breath sounds.  ?Chest:  ?Breasts: ?   Right: No swelling, bleeding, inverted nipple, mass, nipple discharge, skin change or tenderness.  ?   Left: No swelling, bleeding, inverted nipple, mass, nipple discharge, skin change or tenderness.  ?Musculoskeletal:     ?   General: No swelling.  ?   Cervical back: Neck supple.  ?   Right lower leg:  No edema.  ?   Left lower leg: No edema.  ?Lymphadenopathy:  ?   Cervical: No cervical adenopathy.  ?   Upper Body:  ?   Right upper body: No supraclavicular, axillary or pectoral adenopathy.  ?   Left upper b

## 2021-11-19 NOTE — Progress Notes (Signed)
Patient reports Brain fog ?

## 2022-03-10 ENCOUNTER — Encounter: Payer: Self-pay | Admitting: Radiation Oncology

## 2022-03-10 ENCOUNTER — Ambulatory Visit
Admission: RE | Admit: 2022-03-10 | Discharge: 2022-03-10 | Disposition: A | Discharge status: Home / Self Care | Payer: Medicare HMO | Source: Ambulatory Visit | Attending: Radiation Oncology | Admitting: Radiation Oncology

## 2022-03-10 VITALS — BP 116/91 | HR 109 | Temp 96.2°F | Resp 16 | Ht 64.5 in | Wt 239.9 lb

## 2022-03-10 DIAGNOSIS — Z923 Personal history of irradiation: Secondary | ICD-10-CM | POA: Diagnosis not present

## 2022-03-10 DIAGNOSIS — Z170 Estrogen receptor positive status [ER+]: Secondary | ICD-10-CM | POA: Insufficient documentation

## 2022-03-10 DIAGNOSIS — Z79811 Long term (current) use of aromatase inhibitors: Secondary | ICD-10-CM | POA: Insufficient documentation

## 2022-03-10 DIAGNOSIS — C50411 Malignant neoplasm of upper-outer quadrant of right female breast: Secondary | ICD-10-CM | POA: Insufficient documentation

## 2022-03-10 NOTE — Progress Notes (Signed)
Radiation Oncology Follow up Note  Name: Destiny Rogers   Date:   03/10/2022 MRN:  [id number] DOB: 03-11-1961    This 61 y.o. female presents to the clinic today for 4-year follow-up status post whole breast radiation to her right breast for stage IIa invasive mammary carcinoma ER/PR positive.  REFERRING PROVIDER: Maryland Pink, MD  HPI: Patient is a 61 year old female now out for years having pleated whole breast radiation to her right breast for stage IIa ER/PR positive invasive mammary carcinoma.  Seen today in routine follow-up she is doing well she specifically denies breast tenderness cough or bone pain.  She does occasionally have some slight shooting pain in her axilla most likely secondary to scar tissue..  She had mammograms back in April which I have reviewed were BI-RADS Category 1 negative.  She is currently on letrozole tolerating that well without side effect.  COMPLICATIONS OF TREATMENT: none  FOLLOW UP COMPLIANCE: keeps appointments   PHYSICAL EXAM:  BP (!) 116/91   Pulse (!) 109   Temp (!) 96.2 F (35.7 C)   Resp 16   Ht 5' 4.5" (1.638 m)   Wt 239 lb 14.4 oz (108.8 kg)   LMP 04/09/2005 Comment: Tubal Ligation  BMI 40.54 kg/m  Lungs are clear to A&P cardiac examination essentially unremarkable with regular rate and rhythm. No dominant mass or nodularity is noted in either breast in 2 positions examined. Incision is well-healed. No axillary or supraclavicular adenopathy is appreciated. Cosmetic result is excellent.  Well-developed well-nourished patient in NAD. HEENT reveals PERLA, EOMI, discs not visualized.  Oral cavity is clear. No oral mucosal lesions are identified. Neck is clear without evidence of cervical or supraclavicular adenopathy. Lungs are clear to A&P. Cardiac examination is essentially unremarkable with regular rate and rhythm without murmur rub or thrill. Abdomen is benign with no organomegaly or masses noted. Motor sensory and DTR levels are equal and  symmetric in the upper and lower extremities. Cranial nerves II through XII are grossly intact. Proprioception is intact. No peripheral adenopathy or edema is identified. No motor or sensory levels are noted. Crude visual fields are within normal range.  RADIOLOGY RESULTS: Mammograms reviewed compatible with above-stated findings  PLAN: Present time patient is now out over 4 years with no evidence of disease.  I am going to discontinue follow-up care.  She continues follow-up care with both her surgeon as well as medical oncology.  Patient knows to call at anytime with any concerns.  I would like to take this opportunity to thank you for allowing me to participate in the care of your patient.Noreene Filbert, MD

## 2022-03-17 ENCOUNTER — Other Ambulatory Visit: Payer: Self-pay | Admitting: Oncology

## 2022-05-21 ENCOUNTER — Encounter: Payer: Self-pay | Admitting: Oncology

## 2022-05-22 ENCOUNTER — Inpatient Hospital Stay: Payer: Medicare HMO | Attending: Oncology | Admitting: Oncology

## 2022-05-22 ENCOUNTER — Encounter: Payer: Self-pay | Admitting: Oncology

## 2022-05-22 VITALS — BP 125/83 | HR 117 | Temp 97.3°F | Resp 16 | Ht 64.5 in | Wt 242.0 lb

## 2022-05-22 DIAGNOSIS — Z170 Estrogen receptor positive status [ER+]: Secondary | ICD-10-CM | POA: Insufficient documentation

## 2022-05-22 DIAGNOSIS — G8929 Other chronic pain: Secondary | ICD-10-CM | POA: Insufficient documentation

## 2022-05-22 DIAGNOSIS — M8580 Other specified disorders of bone density and structure, unspecified site: Secondary | ICD-10-CM | POA: Insufficient documentation

## 2022-05-22 DIAGNOSIS — C50411 Malignant neoplasm of upper-outer quadrant of right female breast: Secondary | ICD-10-CM | POA: Diagnosis not present

## 2022-05-22 DIAGNOSIS — Z79811 Long term (current) use of aromatase inhibitors: Secondary | ICD-10-CM | POA: Diagnosis not present

## 2022-05-22 MED ORDER — FLUCONAZOLE 150 MG PO TABS: 150.0000 mg | ORAL_TABLET | Freq: Once | ORAL | 0 refills | Status: AC

## 2022-05-22 NOTE — Progress Notes (Signed)
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  INTERVAL HISTORY: Patient returns to clinic today for routine 15-monthevaluation.  She continues to have hot flashes with letrozole, but these are tolerable and do not affect her day-to-day activity.  She otherwise feels well.  She has no neurologic complaints.  She does not complain of weakness or fatigue today.  She has a good appetite and denies weight loss.  She denies any chest pain, shortness of breath, cough, or hemoptysis.  She denies any nausea, vomiting, constipation, or diarrhea. She has no urinary complaints.  Patient offers no further specific complaints today.  REVIEW OF SYSTEMS:   Review of Systems  Constitutional: Negative.  Negative for fever, malaise/fatigue and weight loss.  Respiratory: Negative.  Negative for cough and shortness of breath.   Cardiovascular: Negative.  Negative for chest pain and leg swelling.  Gastrointestinal: Negative.  Negative for abdominal pain.  Genitourinary: Negative.  Negative for dysuria and flank pain.  Musculoskeletal: Negative.  Negative for back pain and joint pain.  Skin: Negative.  Negative for itching and rash.  Neurological:  Positive for sensory change. Negative for tingling, focal weakness and weakness.  Psychiatric/Behavioral: Negative.  The patient is not nervous/anxious and does not have  insomnia.     As per HPI. Otherwise, a complete review of systems is negative.  PAST MEDICAL HISTORY: Past Medical History:  Diagnosis Date   Anemia    Arthritis    Breast cancer ([facility] 2018   Right breast cancer- [facility]   Dyspnea    SINCE STARTING CHEMO WITH EXERTION   GERD (gastroesophageal reflux disease)    NO MEDS   Heart murmur    DX IN TEENS-ASYMPTOMATIC   Hypertension    Migraines    H/O MIGRAINES   Personal history of chemotherapy 2018   finished 09/08/17   Personal history of radiation therapy    Rt. breast   Thumb fracture    LEFT-WEARING BRACE    PAST SURGICAL HISTORY: Past Surgical History:  Procedure Laterality Date   ABLATION  2006   BREAST BIOPSY Right 03/19/2017    [facility]   BREAST LUMPECTOMY Right 10/14/2017   breast ca rad and chemo    CESAREAN SECTION     FRACTURE SURGERY Left 2013   left hand, thumb (screws and titanium)   INCISION AND DRAINAGE ABSCESS Right 04/26/2018   Procedure: INCISION AND DRAINAGE ABSCESS;  Surgeon: CHerbert Pun MD;  Location: [facility];  Service: General;  Laterality: Right;   PARTIAL MASTECTOMY WITH NEEDLE LOCALIZATION Right 10/14/2017   Procedure: PARTIAL MASTECTOMY WITH NEEDLE LOCALIZATION;  Surgeon: CHerbert Pun MD;  Location: [facility];  Service: General;  Laterality: Right;   PILONIDAL CYST EXCISION  1980'S   PORT-A-CATH REMOVAL N/A 04/27/2017   Procedure: REMOVAL OF PORT A CATH & insertion PORT A CATH LEFT INTERNAL JUGULAR;  Surgeon: CHerbert Pun MD;  Location: [facility];  Service: General;  Laterality: N/A;   PORTACATH PLACEMENT  N/A 04/15/2017   Procedure: INSERTION PORT-A-CATH;  Surgeon: Herbert Pun, MD;  Location: [facility];  Service: General;  Laterality: N/A;   SENTINEL NODE BIOPSY Right 10/14/2017   Procedure: SENTINEL NODE BIOPSY;  Surgeon: Herbert Pun, MD;  Location: [facility];  Service: General;  Laterality: Right;    FAMILY HISTORY: Family History   Problem Relation Age of Onset   Breast cancer Maternal Grandmother    Hypertension Maternal Grandmother    Angina Mother    Lung cancer Father    Diabetes Father    Prostate cancer Maternal Uncle    Dementia Maternal Grandfather    Congestive Heart Failure Maternal Grandfather    Heart disease Paternal Grandmother     ADVANCED DIRECTIVES (Y/N):  N  HEALTH MAINTENANCE: Social History   Tobacco Use   Smoking status: Former    Packs/day: 2.00    Years: 18.00    Total pack years: 36.00    Types: Cigarettes    Quit date: 05/01/2017    Years since quitting: 5.0   Smokeless tobacco: Never   Tobacco comments:    quit 4 months ago  Vaping Use   Vaping Use: Never used  Substance Use Topics   Alcohol use: No   Drug use: No     Colonoscopy:  PAP:  Bone density:  Lipid panel:  Allergies  Allergen Reactions   Hydrocodone-Acetaminophen Itching and Rash   Penicillins Swelling, Other (See Comments) and Rash    Throat swelling Has patient had a PCN reaction causing immediate rash, facial/tongue/throat swelling, SOB or lightheadedness with hypotension: Yes Has patient had a PCN reaction causing severe rash involving mucus membranes or skin necrosis: No Has patient had a PCN reaction that required hospitalization: No Has patient had a PCN reaction occurring within the last 10 years: No If all of the above answers are "NO", then may proceed with Cephalosporin use.     Current Outpatient Medications  Medication Sig Dispense Refill   acetaminophen (TYLENOL) 500 MG tablet Take 1,000 mg by mouth every 8 (eight) hours as needed for mild pain.     allopurinol (ZYLOPRIM) 300 MG tablet Take 300 mg by mouth daily.     colchicine 0.6 MG tablet Take by mouth.     fluconazole (DIFLUCAN) 150 MG tablet Take 1 tablet (150 mg total) by mouth once for 1 dose. Repeat if needed 1 tablet 0   letrozole (FEMARA) 2.5 MG tablet TAKE 1 TABLET BY MOUTH EVERY DAY 90 tablet 1   metoprolol succinate  (TOPROL-XL) 50 MG 24 hr tablet Take 1 tablet by mouth daily.     PARoxetine (PAXIL) 20 MG tablet Take 20 mg by mouth daily.     ALPRAZolam (XANAX) 0.25 MG tablet Take 0.25 mg by mouth daily as needed. (Patient not taking: Reported on 11/19/2021)     hydrochlorothiazide (HYDRODIURIL) 12.5 MG tablet Take 1 tablet by mouth as needed. 1 tablet daily (Patient not taking: Reported on 11/19/2021)     ibuprofen (ADVIL,MOTRIN) 600 MG tablet  (Patient not taking: Reported on 11/19/2021)  0   Investigational aspirin/placebo 300 MG tablet Alliance [id number] Take 1 tablet by mouth daily. Take with food or a full glass of water.  Do not crush enteric coated tablets. (Patient not taking: Reported on 03/07/2021) 180 tablet 0   Investigational aspirin/placebo 300 MG tablet Alliance [id number] Take 1 tablet by mouth daily. Take with food or a full glass of water.  Do not crush enteric coated tablets. (Patient not taking:  Reported on 03/07/2021) 180 tablet 0   loratadine (CLARITIN) 10 MG tablet Take 10 mg by mouth daily as needed for allergies. (Patient not taking: Reported on 11/19/2021)     naproxen (NAPROSYN) 500 MG tablet Take 500 mg by mouth 2 (two) times daily. (Patient not taking: Reported on 11/19/2021)     nystatin (MYCOSTATIN/NYSTOP) powder Apply 1 application topically 3 (three) times daily. (Patient not taking: Reported on 11/19/2021) 15 g 0   nystatin-triamcinolone ointment (MYCOLOG) APPLY TO AFFECTED AREA TWICE A DAY (Patient not taking: Reported on 11/19/2021) 30 g 0   No current facility-administered medications for this visit.    OBJECTIVE: Vitals:   05/22/22 1112  BP: 125/83  Pulse: (!) 117  Resp: 16  Temp: (!) 97.3 F (36.3 C)  SpO2: 97%     Body mass index is 40.9 kg/m.    ECOG FS:0 - Asymptomatic  General: Well-developed, well-nourished, no acute distress. Eyes: Pink conjunctiva, anicteric sclera. HEENT: Normocephalic, moist mucous membranes. Breast: Exam deferred today. Lungs: No audible wheezing or  coughing. Heart: Regular rate and rhythm. Abdomen: Soft, nontender, no obvious distention. Musculoskeletal: No edema, cyanosis, or clubbing. Neuro: Alert, answering all questions appropriately. Cranial nerves grossly intact. Skin: No rashes or petechiae noted. Psych: Normal affect.  LAB RESULTS:  Lab Results  Component Value Date   NA 139 04/05/2021   K 3.6 04/05/2021   CL 100 04/05/2021   CO2 28 04/05/2021   GLUCOSE 180 (H) 04/05/2021   BUN 16 04/05/2021   CREATININE 0.95 04/05/2021   CALCIUM 9.0 04/05/2021   PROT 7.4 04/05/2021   ALBUMIN 3.8 04/05/2021   AST 22 04/05/2021   ALT 22 04/05/2021   ALKPHOS 138 (H) 04/05/2021   BILITOT 0.4 04/05/2021   GFRNONAA >60 04/05/2021   GFRAA >60 03/15/2020    Lab Results  Component Value Date   WBC 8.0 04/05/2021   NEUTROABS 4.8 04/05/2021   HGB 13.3 04/05/2021   HCT 41.3 04/05/2021   MCV 72.8 (L) 04/05/2021   PLT 247 04/05/2021     STUDIES: No results found.  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  PLAN:    1. 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  Patient completed neoadjuvant chemotherapy with Adriamycin, Cytoxan, and Taxol on September 08, 2017.  She then underwent partial mastectomy on October 14, 2017.  Patient noted to have residual disease, therefore proceeded with adjuvant XRT which she completed on January 06, 2018.  Despite hot flashes, patient wishes to continue with letrozole and will complete 5 years of treatment in June 2024.  Can consider extended treatment, but given patient's hot flashes will likely discontinue.  Her most recent mammogram on November 13, 2021 was reported as BI-RADS 1.  Repeat in April 2024.  Return to clinic in 6 months for routine evaluation.   2.  Hot  flashes: Chronic and unchanged.  Continue letrozole as above.   3.  Osteopenia: Patient's most recent bone mineral density on April 17, 2021 revealed a T score of -1.0 which was unchanged from previous.  Consider repeat DEXA scan in April 2024 along with mammogram.   4.  Insomnia/anxiety: Patient does not complain of this today.  Continue Xanax as needed. 5.  Back pain: Chronic and unchanged. 6.  Clinical trials enrolled:  [id number] - BWEL: Randomized Phase III Trial Evaluating the Role of Weight Loss in Adjuvant Treatment of Overweight and Obese Women with Early Breast Cancer  [id number]: A randomized phase III double blinded placebo-controlled trial of aspirin as adjuvant therapy for HER2 negative breast cancer: the ABC trial    Patient expressed understanding and was in agreement with this plan. She also understands that She can call clinic at any time with any questions, concerns, or complaints.    Cancer Staging  Primary cancer of upper outer quadrant of right female breast [facility]) Staging form: Breast, AJCC 8th Edition - Clinical stage from 03/27/2017: Stage IB (cT1c, cN1, cM0, G2, ER+, PR+, HER2-) - Signed by Lloyd Huger, MD on 03/27/2017 Histologic grading system: 3 grade system Laterality: Right - Pathologic stage from 10/22/2017: No Stage Recommended (ypT1c, pN1a, cM0, G2, ER+, PR+, HER2-) - Signed by Lloyd Huger, MD on 10/22/2017 Stage prefix: Post-therapy Neoadjuvant therapy: Yes Histologic grading system: 3 grade system Laterality: Right   Lloyd Huger, MD   05/22/2022 1:29 PM

## 2022-05-28 ENCOUNTER — Ambulatory Visit (LOCAL_COMMUNITY_HEALTH_CENTER): Payer: Medicare HMO

## 2022-05-28 DIAGNOSIS — Z719 Counseling, unspecified: Secondary | ICD-10-CM

## 2022-05-28 DIAGNOSIS — Z23 Encounter for immunization: Secondary | ICD-10-CM

## 2022-05-28 NOTE — Progress Notes (Signed)
  Are you feeling sick today? No   Have you ever received a dose of COVID-19 Vaccine? [employer], [place], [place], [place], Other) Yes  If yes, which vaccine and how many doses?  [place], 4   Did you bring the vaccination record card or other documentation?  Yes   Do you have a health condition or are undergoing treatment that makes you moderately or severely immunocompromised? This would include, but not be limited to: cancer, HIV, organ transplant, immunosuppressive therapy/high-dose corticosteroids, or moderate/severe primary immunodeficiency.  Yes-had breast Ca, surgery, and treatments 4 yrs ago per pt.   Have you received COVID-19 vaccine before or during hematopoietic cell transplant (HCT) or CAR-T-cell therapies? No  Have you ever had an allergic reaction to: (This would include a severe allergic reaction or a reaction that caused hives, swelling, or respiratory distress, including wheezing.) A component of a COVID-19 vaccine or a previous dose of COVID-19 vaccine? Yes- Penicillins-Anaphylaxis   Have you ever had an allergic reaction to another vaccine (other thanCOVID-19 vaccine) or an injectable medication? (This would include a severe allergic reaction or a reaction that caused hives, swelling, or respiratory distress, including wheezing.)   No    Do you have a history of any of the following:  Myocarditis or Pericarditis No  Dermal fillers:  No  Multisystem Inflammatory Syndrome (MIS-C or MIS-A)? No  COVID-19 disease within the past 3 months? No  Vaccinated with monkeypox vaccine in the last 4 weeks? No  Eligible, administered [place] [phone], monitored and tolerated well. M.Amatullah Christy, LPN

## 2022-06-19 ENCOUNTER — Ambulatory Visit (LOCAL_COMMUNITY_HEALTH_CENTER): Payer: Medicare HMO

## 2022-06-19 DIAGNOSIS — Z23 Encounter for immunization: Secondary | ICD-10-CM

## 2022-06-19 DIAGNOSIS — Z719 Counseling, unspecified: Secondary | ICD-10-CM

## 2022-06-19 NOTE — Progress Notes (Signed)
Tolerated flu vaccine well today. Updated [facility] copy given. Josie Saunders, RN

## 2022-07-31 ENCOUNTER — Telehealth: Payer: Self-pay

## 2022-07-31 NOTE — Telephone Encounter (Signed)
Research nurse called patient to schedule a time for her to come in for the [id number] waist and hip measurements with questionnaires for the 4 year follow up visit. Message left for the patient to return call at her earliest convenience.  Jeral Fruit, RN 07/31/22 2:13 PM

## 2022-08-01 DIAGNOSIS — C50411 Malignant neoplasm of upper-outer quadrant of right female breast: Secondary | ICD-10-CM

## 2022-08-01 NOTE — Research (Signed)
[place] 4 Year Follow Up Research Visit:   Research nurse called patient today to schedule a time for her to have her waist and hip circumference measured and questionnaires completed for the protocol. The patient states she will be here on Friday at 1100 am to complete the study activities for this visit, her provider [facility] was completed in November and is within window range for the protocol. Research nurse will email a reminder to the patient on Tuesday for her appointment.  Jeral Fruit, RN 08/01/22 4:13 PM

## 2022-08-01 NOTE — Research (Deleted)
[place] 4 Year Follow Up Research Visit:   Research nurse called patient today to schedule a time for her to have her waist and hip circumference measured and questionnaires completed for the protocol. The patient states she will be here on Friday at 1100 am to complete the study activities for this visit, her provider [facility] was completed in November and is within window range for the protocol. Research nurse will email a reminder to the patient on Tuesday for her appointment.  Jeral Fruit, RN 08/01/22 4:11 PM

## 2022-08-08 ENCOUNTER — Inpatient Hospital Stay: Payer: Medicare HMO | Attending: Oncology

## 2022-08-08 DIAGNOSIS — C50411 Malignant neoplasm of upper-outer quadrant of right female breast: Secondary | ICD-10-CM

## 2022-08-08 NOTE — Research (Signed)
A [id number] BWEL 48 Month Measurements and Questionnaires:  Destiny Rogers came in to the cancer center today for her scheduled research visit to complete her questionnaires and measurements for the BWEL protocol. She denied having any problems / issues today. The patients height and weight were measured per protocol with her shoes off. Her height was 64.5 in and weight was 241.0 lbs. She states her weight is up but she has been really trying to lose some weight these days. Patients hip measurements were 45 in and 46 in subsequently, and her waist measurements were 46 in and 45 in. The patient did remove her shirt and pants were down under measurement areas. This was performed in private in the research office conference room. The patient completed her questionnaires without any difficulty, no assistance required. The patient was informed she will not need a research visit for the protocol for one year. We will need to obtain research labs at the next annual visit. The patient asked when her next appointment was with Dr. Grayland Ormond, informed of 11/20/2022 appointment time. Patient asked if her mammogram was scheduled, there isn't one showing in her appointments currently. Research nurse will follow up with Dr. Grayland Ormond concerning this and let the patient know when it will be. The patient denied having any further questions and was thanked for taking the time to come in for her visit today with the research department. Patient encouraged to call at any time she has any questions or concerns.  Jeral Fruit, RN 08/08/22 11:43 AM

## 2022-10-29 ENCOUNTER — Other Ambulatory Visit: Payer: Self-pay | Admitting: Family Medicine

## 2022-10-29 DIAGNOSIS — Z1231 Encounter for screening mammogram for malignant neoplasm of breast: Secondary | ICD-10-CM

## 2022-11-19 ENCOUNTER — Ambulatory Visit
Admission: RE | Admit: 2022-11-19 | Discharge: 2022-11-19 | Disposition: A | Discharge status: Home / Self Care | Payer: Medicare HMO | Source: Ambulatory Visit | Attending: Family Medicine | Admitting: Family Medicine

## 2022-11-19 DIAGNOSIS — Z1231 Encounter for screening mammogram for malignant neoplasm of breast: Secondary | ICD-10-CM | POA: Diagnosis present

## 2022-11-20 ENCOUNTER — Inpatient Hospital Stay: Payer: Medicare HMO | Attending: Oncology | Admitting: Oncology

## 2022-11-20 ENCOUNTER — Encounter: Payer: Self-pay | Admitting: Oncology

## 2022-11-20 VITALS — BP 153/99 | HR 114 | Temp 96.4°F | Wt 238.9 lb

## 2022-11-20 DIAGNOSIS — Z170 Estrogen receptor positive status [ER+]: Secondary | ICD-10-CM | POA: Insufficient documentation

## 2022-11-20 DIAGNOSIS — C50411 Malignant neoplasm of upper-outer quadrant of right female breast: Secondary | ICD-10-CM

## 2022-11-20 DIAGNOSIS — Z79811 Long term (current) use of aromatase inhibitors: Secondary | ICD-10-CM | POA: Insufficient documentation

## 2022-11-20 DIAGNOSIS — M8580 Other specified disorders of bone density and structure, unspecified site: Secondary | ICD-10-CM | POA: Insufficient documentation

## 2022-11-20 NOTE — Progress Notes (Signed)
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  INTERVAL HISTORY: Patient returns to clinic today for routine 57-month evaluation.  She continues to have hot flashes with letrozole.  She continues to have chronic back pain.  She has no neurologic complaints.  She does not complain of weakness or fatigue today.  She has a good appetite and denies weight loss.  She denies any chest pain, shortness of breath, cough, or hemoptysis.  She denies any nausea, vomiting, constipation, or diarrhea. She has no urinary complaints.  Patient offers no further specific complaints today.    REVIEW OF SYSTEMS:   Review of Systems  Constitutional: Negative.  Negative for fever, malaise/fatigue and weight loss.  Respiratory: Negative.  Negative for cough and shortness of breath.   Cardiovascular: Negative.  Negative for chest pain and leg swelling.  Gastrointestinal: Negative.  Negative for abdominal pain.  Genitourinary: Negative.  Negative for dysuria and flank pain.  Musculoskeletal:  Positive for back pain. Negative for joint pain.  Skin: Negative.  Negative for itching and rash.  Neurological:  Positive for sensory change. Negative for tingling, focal weakness and weakness.  Psychiatric/Behavioral: Negative.  The patient is not nervous/anxious and does not have insomnia.     As per HPI. Otherwise, a complete  review of systems is negative.  PAST MEDICAL HISTORY: Past Medical History:  Diagnosis Date   Anemia    Arthritis    Breast cancer (HCC) 2018   Right breast cancer- [facility]   Dyspnea    SINCE STARTING CHEMO WITH EXERTION   GERD (gastroesophageal reflux disease)    NO MEDS   Heart murmur    DX IN TEENS-ASYMPTOMATIC   Hypertension    Migraines    H/O MIGRAINES   Personal history of chemotherapy 2018   finished 09/08/17   Personal history of radiation therapy    Rt. breast   Thumb fracture    LEFT-WEARING BRACE    PAST SURGICAL HISTORY: Past Surgical History:  Procedure Laterality Date   ABLATION  2006   BREAST BIOPSY Right 03/19/2017    [facility] and mets in lymph node   BREAST LUMPECTOMY Right 10/14/2017   breast ca rad and chemo    CESAREAN SECTION     FRACTURE SURGERY Left 2013   left hand, thumb (screws and titanium)   INCISION AND DRAINAGE ABSCESS Right 04/26/2018   Procedure: INCISION AND DRAINAGE ABSCESS;  Surgeon: Carolan Shiver, MD;  Location: [facility];  Service: General;  Laterality: Right;   PARTIAL MASTECTOMY WITH NEEDLE LOCALIZATION Right 10/14/2017   Procedure: PARTIAL MASTECTOMY WITH NEEDLE LOCALIZATION;  Surgeon: Carolan Shiver, MD;  Location: [facility];  Service: General;  Laterality: Right;   PILONIDAL CYST EXCISION  1980'S   PORT-A-CATH REMOVAL N/A 04/27/2017   Procedure: REMOVAL OF PORT A CATH & insertion PORT A CATH LEFT INTERNAL JUGULAR;  Surgeon: Carolan Shiver, MD;  Location: [facility];  Service: General;  Laterality: N/A;   PORTACATH PLACEMENT N/A 04/15/2017   Procedure: INSERTION  PORT-A-CATH;  Surgeon: Carolan Shiver, MD;  Location: [facility];  Service: General;  Laterality: N/A;   SENTINEL NODE BIOPSY Right 10/14/2017   Procedure: SENTINEL NODE BIOPSY;  Surgeon: Carolan Shiver, MD;  Location: [facility];  Service: General;  Laterality: Right;    FAMILY HISTORY: Family History  Problem Relation Age of Onset   Breast cancer  Maternal Grandmother    Hypertension Maternal Grandmother    Angina Mother    Lung cancer Father    Diabetes Father    Prostate cancer Maternal Uncle    Dementia Maternal Grandfather    Congestive Heart Failure Maternal Grandfather    Heart disease Paternal Grandmother     ADVANCED DIRECTIVES (Y/N):  N  HEALTH MAINTENANCE: Social History   Tobacco Use   Smoking status: Former    Packs/day: 2.00    Years: 18.00    Additional pack years: 0.00    Total pack years: 36.00    Types: Cigarettes    Quit date: 05/01/2017    Years since quitting: 5.5   Smokeless tobacco: Never   Tobacco comments:    quit 4 months ago  Vaping Use   Vaping Use: Never used  Substance Use Topics   Alcohol use: No   Drug use: No     Colonoscopy:  PAP:  Bone density:  Lipid panel:  Allergies  Allergen Reactions   Hydrocodone-Acetaminophen Itching and Rash   Penicillins Swelling, Other (See Comments) and Rash    Throat swelling Has patient had a PCN reaction causing immediate rash, facial/tongue/throat swelling, SOB or lightheadedness with hypotension: Yes Has patient had a PCN reaction causing severe rash involving mucus membranes or skin necrosis: No Has patient had a PCN reaction that required hospitalization: No Has patient had a PCN reaction occurring within the last 10 years: No If all of the above answers are "NO", then may proceed with Cephalosporin use.     Current Outpatient Medications  Medication Sig Dispense Refill   acetaminophen (TYLENOL) 500 MG tablet Take 1,000 mg by mouth every 8 (eight) hours as needed for mild pain.     allopurinol (ZYLOPRIM) 300 MG tablet Take 300 mg by mouth daily.     ALPRAZolam (XANAX) 0.25 MG tablet Take 0.25 mg by mouth daily as needed.     colchicine 0.6 MG tablet Take by mouth.     hydrochlorothiazide (HYDRODIURIL) 12.5 MG tablet Take 1 tablet by mouth as needed. 1 tablet daily     letrozole (FEMARA) 2.5 MG tablet TAKE 1 TABLET BY MOUTH EVERY DAY  90 tablet 1   loratadine (CLARITIN) 10 MG tablet Take 10 mg by mouth daily as needed for allergies.     metoprolol succinate (TOPROL-XL) 50 MG 24 hr tablet Take 1 tablet by mouth daily.     PARoxetine (PAXIL) 20 MG tablet Take 20 mg by mouth daily.     ibuprofen (ADVIL,MOTRIN) 600 MG tablet  (Patient not taking: Reported on 11/19/2021)  0   Investigational aspirin/placebo 300 MG tablet [employer] [id number] Take 1 tablet by mouth daily. Take with food or a full glass of water.  Do not crush enteric coated tablets. (Patient not taking: Reported on 03/07/2021) 180 tablet 0   Investigational aspirin/placebo 300 MG tablet Alliance [id number] Take 1 tablet by mouth daily. Take with food or a full glass of water.  Do not crush enteric coated tablets. (Patient not taking: Reported on 03/07/2021) 180 tablet 0   naproxen (NAPROSYN) 500 MG tablet Take 500 mg by  mouth 2 (two) times daily. (Patient not taking: Reported on 11/19/2021)     nystatin (MYCOSTATIN/NYSTOP) powder Apply 1 application topically 3 (three) times daily. (Patient not taking: Reported on 11/19/2021) 15 g 0   nystatin-triamcinolone ointment (MYCOLOG) APPLY TO AFFECTED AREA TWICE A DAY (Patient not taking: Reported on 11/19/2021) 30 g 0   No current facility-administered medications for this visit.    OBJECTIVE: Vitals:   11/20/22 1110  BP: (!) 153/99  Pulse: (!) 114  Temp: (!) 96.4 F (35.8 C)  SpO2: 96%     Body mass index is 40.37 kg/m.    ECOG FS:0 - Asymptomatic  General: Well-developed, well-nourished, no acute distress. Eyes: Pink conjunctiva, anicteric sclera. HEENT: Normocephalic, moist mucous membranes. Breast: Exam deferred today. Lungs: No audible wheezing or coughing. Heart: Regular rate and rhythm. Abdomen: Soft, nontender, no obvious distention. Musculoskeletal: No edema, cyanosis, or clubbing. Neuro: Alert, answering all questions appropriately. Cranial nerves grossly intact. Skin: No rashes or petechiae noted. Psych: Normal  affect.   LAB RESULTS:  Lab Results  Component Value Date   NA 139 04/05/2021   K 3.6 04/05/2021   CL 100 04/05/2021   CO2 28 04/05/2021   GLUCOSE 180 (H) 04/05/2021   BUN 16 04/05/2021   CREATININE 0.95 04/05/2021   CALCIUM 9.0 04/05/2021   PROT 7.4 04/05/2021   ALBUMIN 3.8 04/05/2021   AST 22 04/05/2021   ALT 22 04/05/2021   ALKPHOS 138 (H) 04/05/2021   BILITOT 0.4 04/05/2021   GFRNONAA >60 04/05/2021   GFRAA >60 03/15/2020    Lab Results  Component Value Date   WBC 8.0 04/05/2021   NEUTROABS 4.8 04/05/2021   HGB 13.3 04/05/2021   HCT 41.3 04/05/2021   MCV 72.8 (L) 04/05/2021   PLT 247 04/05/2021     STUDIES: No results found.  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  Patient completed neoadjuvant chemotherapy with Adriamycin, Cytoxan, and Taxol on September 08, 2017.  She then underwent partial mastectomy on October 14, 2017.  Patient noted to have residual disease, therefore proceeded with adjuvant XRT which she completed on January 06, 2018.  Despite hot flashes, patient wishes to continue with letrozole and will complete 5 years of treatment in June 2024.  Patient most recent mammogram on Nov 19, 2022 has not been read and is pending at time of dictation.  No further intervention is needed.  Return to clinic in 6 months for routine evaluation at which point patient can likely be transition to yearly visits.   Hot flashes: Chronic and unchanged.  Continue letrozole as above.  Patient will complete treatment in June 2024. Osteopenia: Patient's most recent bone mineral density on April 17, 2021 revealed a T score of -1.0 which was unchanged from previous.  Repeat in 6 months  prior to next clinic visit. Back pain: Chronic and unchanged.  Follow-up with orthopedics as needed. Clinical trials enrolled:  [id number] - BWEL: Randomized Phase III Trial Evaluating the Role of Weight Loss in Adjuvant Treatment of Overweight and Obese Women with Early Breast Cancer  [id number]: A randomized phase III double blinded placebo-controlled trial of aspirin as adjuvant therapy for HER2 negative breast cancer: the ABC trial    Patient expressed  understanding and was in agreement with this plan. She also understands that She can call clinic at any time with any questions, concerns, or complaints.    Cancer Staging  Primary cancer of upper outer quadrant of right female breast [facility]) Staging form: Breast, AJCC 8th Edition - Clinical stage from 03/27/2017: Stage IB (cT1c, cN1, cM0, G2, ER+, PR+, HER2-) - Signed by Jeralyn Ruths, MD on 03/27/2017 Histologic grading system: 3 grade system Laterality: Right - Pathologic stage from 10/22/2017: No Stage Recommended (ypT1c, pN1a, cM0, G2, ER+, PR+, HER2-) - Signed by Jeralyn Ruths, MD on 10/22/2017 Stage prefix: Post-therapy Neoadjuvant therapy: Yes Histologic grading system: 3 grade system Laterality: Right   Jeralyn Ruths, MD   11/20/2022 1:26 PM

## 2022-11-20 NOTE — Progress Notes (Signed)
Pt. 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 Results from her mammogram from yesterday (11/19/2022).

## 2023-04-21 ENCOUNTER — Ambulatory Visit
Admission: RE | Admit: 2023-04-21 | Discharge: 2023-04-21 | Disposition: A | Discharge status: Home / Self Care | Payer: Medicare HMO | Source: Ambulatory Visit | Attending: Oncology | Admitting: Oncology

## 2023-04-21 DIAGNOSIS — Z780 Asymptomatic menopausal state: Secondary | ICD-10-CM | POA: Insufficient documentation

## 2023-04-21 DIAGNOSIS — C50411 Malignant neoplasm of upper-outer quadrant of right female breast: Secondary | ICD-10-CM | POA: Diagnosis present

## 2023-04-21 DIAGNOSIS — Z13820 Encounter for screening for osteoporosis: Secondary | ICD-10-CM | POA: Insufficient documentation

## 2023-04-21 DIAGNOSIS — M85851 Other specified disorders of bone density and structure, right thigh: Secondary | ICD-10-CM | POA: Insufficient documentation

## 2023-04-21 DIAGNOSIS — Z923 Personal history of irradiation: Secondary | ICD-10-CM | POA: Insufficient documentation

## 2023-04-21 DIAGNOSIS — Z9221 Personal history of antineoplastic chemotherapy: Secondary | ICD-10-CM | POA: Insufficient documentation

## 2023-05-18 ENCOUNTER — Other Ambulatory Visit: Payer: Self-pay | Admitting: Oncology

## 2023-05-20 ENCOUNTER — Other Ambulatory Visit: Payer: Self-pay

## 2023-05-20 ENCOUNTER — Other Ambulatory Visit: Payer: Self-pay | Admitting: Oncology

## 2023-05-20 DIAGNOSIS — C50411 Malignant neoplasm of upper-outer quadrant of right female breast: Secondary | ICD-10-CM

## 2023-05-25 ENCOUNTER — Inpatient Hospital Stay: Payer: Medicare HMO | Attending: Oncology

## 2023-05-25 ENCOUNTER — Other Ambulatory Visit: Payer: Medicare HMO

## 2023-05-25 ENCOUNTER — Encounter: Payer: Self-pay | Admitting: Nurse Practitioner

## 2023-05-25 ENCOUNTER — Inpatient Hospital Stay: Payer: Medicare HMO

## 2023-05-25 ENCOUNTER — Inpatient Hospital Stay: Payer: Medicare HMO | Admitting: Nurse Practitioner

## 2023-05-25 ENCOUNTER — Inpatient Hospital Stay (HOSPITAL_BASED_OUTPATIENT_CLINIC_OR_DEPARTMENT_OTHER): Payer: Medicare HMO | Admitting: Nurse Practitioner

## 2023-05-25 VITALS — BP 133/80 | HR 85 | Temp 97.2°F | Wt 240.0 lb

## 2023-05-25 DIAGNOSIS — C50411 Malignant neoplasm of upper-outer quadrant of right female breast: Secondary | ICD-10-CM

## 2023-05-25 DIAGNOSIS — N951 Menopausal and female climacteric states: Secondary | ICD-10-CM

## 2023-05-25 DIAGNOSIS — Z853 Personal history of malignant neoplasm of breast: Secondary | ICD-10-CM | POA: Insufficient documentation

## 2023-05-25 DIAGNOSIS — M549 Dorsalgia, unspecified: Secondary | ICD-10-CM | POA: Insufficient documentation

## 2023-05-25 DIAGNOSIS — Z23 Encounter for immunization: Secondary | ICD-10-CM | POA: Diagnosis not present

## 2023-05-25 DIAGNOSIS — Z08 Encounter for follow-up examination after completed treatment for malignant neoplasm: Secondary | ICD-10-CM | POA: Insufficient documentation

## 2023-05-25 DIAGNOSIS — G8929 Other chronic pain: Secondary | ICD-10-CM | POA: Diagnosis not present

## 2023-05-25 DIAGNOSIS — M8580 Other specified disorders of bone density and structure, unspecified site: Secondary | ICD-10-CM | POA: Insufficient documentation

## 2023-05-25 LAB — CBC WITH DIFFERENTIAL/PLATELET
Abs Immature Granulocytes: 0.03 10*3/uL (ref 0.00–0.07)
Basophils Absolute: 0 10*3/uL (ref 0.0–0.1)
Basophils Relative: 1 %
Eosinophils Absolute: 0.2 10*3/uL (ref 0.0–0.5)
Eosinophils Relative: 3 %
HCT: 41 % (ref 36.0–46.0)
Hemoglobin: 13.2 g/dL (ref 12.0–15.0)
Immature Granulocytes: 0 %
Lymphocytes Relative: 29 %
Lymphs Abs: 2.4 10*3/uL (ref 0.7–4.0)
MCH: 24.3 pg — ABNORMAL LOW (ref 26.0–34.0)
MCHC: 32.2 g/dL (ref 30.0–36.0)
MCV: 75.5 fL — ABNORMAL LOW (ref 80.0–100.0)
Monocytes Absolute: 0.6 10*3/uL (ref 0.1–1.0)
Monocytes Relative: 8 %
Neutro Abs: 5 10*3/uL (ref 1.7–7.7)
Neutrophils Relative %: 59 %
Platelets: 282 10*3/uL (ref 150–400)
RBC: 5.43 MIL/uL — ABNORMAL HIGH (ref 3.87–5.11)
RDW: 16.2 % — ABNORMAL HIGH (ref 11.5–15.5)
WBC: 8.3 10*3/uL (ref 4.0–10.5)
nRBC: 0 % (ref 0.0–0.2)

## 2023-05-25 LAB — COMPREHENSIVE METABOLIC PANEL
ALT: 21 U/L (ref 0–44)
AST: 26 U/L (ref 15–41)
Albumin: 3.9 g/dL (ref 3.5–5.0)
Alkaline Phosphatase: 133 U/L — ABNORMAL HIGH (ref 38–126)
Anion gap: 9 (ref 5–15)
BUN: 10 mg/dL (ref 8–23)
CO2: 26 mmol/L (ref 22–32)
Calcium: 9 mg/dL (ref 8.9–10.3)
Chloride: 102 mmol/L (ref 98–111)
Creatinine, Ser: 0.98 mg/dL (ref 0.44–1.00)
GFR, Estimated: >60 mL/min (ref >60)
Glucose, Bld: 100 mg/dL — ABNORMAL HIGH (ref 70–99)
Potassium: 3.8 mmol/L (ref 3.5–5.1)
Sodium: 137 mmol/L (ref 135–145)
Total Bilirubin: 0.3 mg/dL (ref <1.2)
Total Protein: 7.1 g/dL (ref 6.5–8.1)

## 2023-05-25 MED ORDER — INFLUENZA VIRUS VACC SPLIT PF (FLUZONE) 0.5 ML IM SUSY
0.5000 mL | PREFILLED_SYRINGE | Freq: Once | INTRAMUSCULAR | Status: AC
  Administered 2023-05-25: 0.5 mL via INTRAMUSCULAR
  Filled 2023-05-25: qty 0.5

## 2023-05-25 MED ORDER — GABAPENTIN 300 MG PO CAPS: 300.0000 mg | ORAL_CAPSULE | Freq: Every day | ORAL | 2 refills | Status: DC

## 2023-05-25 NOTE — Progress Notes (Signed)
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  INTERVAL HISTORY: Patient returns to clinic today for routine 48-month evaluation. She has stopped letrozole after 5 years. Her hot flashes worsened after stopping. She otherwise feels at baseline. She denies breast lumps, skin changes, or pain. No new bone pain. Appetite is good and she denies unintentional weight loss. No nausea, vomiting, constipation, or diarrhea. No vaginal bleeding.    REVIEW OF SYSTEMS:   Review of Systems  Constitutional:  Negative for fever, malaise/fatigue and weight loss.       Hot flashes. Worse at night  Respiratory:  Negative for cough and shortness of breath.   Cardiovascular:  Negative for chest pain, palpitations and leg swelling.  Gastrointestinal:  Negative for abdominal pain, nausea and vomiting.  Genitourinary:  Negative for dysuria and flank pain.  Musculoskeletal:  Positive for back pain. Negative for falls and joint pain.  Skin:  Negative for itching and rash.  Neurological:  Positive for sensory change. Negative for tingling, focal weakness and weakness.  Psychiatric/Behavioral:  Negative for depression. The patient is not nervous/anxious and does not have insomnia.   As per HPI. Otherwise, a complete review of systems is negative.   PAST MEDICAL HISTORY: Past Medical History:  Diagnosis Date   Anemia     Arthritis    Breast cancer (HCC) 2018   Right breast cancer- [facility]   Dyspnea    SINCE STARTING CHEMO WITH EXERTION   GERD (gastroesophageal reflux disease)    NO MEDS   Heart murmur    DX IN TEENS-ASYMPTOMATIC   Hypertension    Migraines    H/O MIGRAINES   Personal history of chemotherapy 2018   finished 09/08/17   Personal history of radiation therapy    Rt. breast   Thumb fracture    LEFT-WEARING BRACE    PAST SURGICAL HISTORY: Past Surgical History:  Procedure Laterality Date   ABLATION  2006   BREAST BIOPSY Right 03/19/2017    [facility] and mets in lymph node   BREAST LUMPECTOMY Right 10/14/2017   breast ca rad and chemo    CESAREAN SECTION     FRACTURE SURGERY Left 2013   left hand, thumb (screws and titanium)   INCISION AND DRAINAGE ABSCESS Right 04/26/2018   Procedure: INCISION AND DRAINAGE ABSCESS;  Surgeon: Carolan Shiver, MD;  Location: [facility];  Service: General;  Laterality: Right;   PARTIAL MASTECTOMY WITH NEEDLE LOCALIZATION Right 10/14/2017   Procedure: PARTIAL MASTECTOMY WITH NEEDLE LOCALIZATION;  Surgeon: Carolan Shiver, MD;  Location: [facility];  Service: General;  Laterality: Right;   PILONIDAL CYST EXCISION  1980'S   PORT-A-CATH REMOVAL N/A 04/27/2017   Procedure: REMOVAL OF PORT A CATH & insertion PORT A CATH LEFT INTERNAL JUGULAR;  Surgeon: Carolan Shiver, MD;  Location: [facility];  Service: General;  Laterality: N/A;   PORTACATH PLACEMENT N/A 04/15/2017   Procedure: INSERTION PORT-A-CATH;  Surgeon: Carolan Shiver, MD;  Location: [facility];  Service: General;  Laterality: N/A;  SENTINEL NODE BIOPSY Right 10/14/2017   Procedure: SENTINEL NODE BIOPSY;  Surgeon: Carolan Shiver, MD;  Location: [facility];  Service: General;  Laterality: Right;    FAMILY HISTORY: Family History  Problem Relation Age of Onset   Breast cancer Maternal Grandmother    Hypertension Maternal Grandmother    Angina Mother    Lung cancer Father     Diabetes Father    Prostate cancer Maternal Uncle    Dementia Maternal Grandfather    Congestive Heart Failure Maternal Grandfather    Heart disease Paternal Grandmother     ADVANCED DIRECTIVES (Y/N):  N  HEALTH MAINTENANCE: Social History   Tobacco Use   Smoking status: Former    Current packs/day: 0.00    Average packs/day: 2.0 packs/day for 18.0 years (36.0 ttl pk-yrs)    Types: Cigarettes    Start date: 05/02/1999    Quit date: 05/01/2017    Years since quitting: 6.0   Smokeless tobacco: Never   Tobacco comments:    quit 4 months ago  Vaping Use   Vaping status: Never Used  Substance Use Topics   Alcohol use: No   Drug use: No    Colonoscopy:  PAP:  Bone density:  Lipid panel:  Allergies  Allergen Reactions   Hydrocodone-Acetaminophen Itching and Rash   Penicillins Swelling, Other (See Comments) and Rash    Throat swelling Has patient had a PCN reaction causing immediate rash, facial/tongue/throat swelling, SOB or lightheadedness with hypotension: Yes Has patient had a PCN reaction causing severe rash involving mucus membranes or skin necrosis: No Has patient had a PCN reaction that required hospitalization: No Has patient had a PCN reaction occurring within the last 10 years: No If all of the above answers are "NO", then may proceed with Cephalosporin use.     Current Outpatient Medications  Medication Sig Dispense Refill   acetaminophen (TYLENOL) 500 MG tablet Take 1,000 mg by mouth every 8 (eight) hours as needed for mild pain.     allopurinol (ZYLOPRIM) 300 MG tablet Take 300 mg by mouth daily.     ALPRAZolam (XANAX) 0.25 MG tablet Take 0.25 mg by mouth daily as needed.     colchicine 0.6 MG tablet Take by mouth.     hydrochlorothiazide (HYDRODIURIL) 12.5 MG tablet Take 1 tablet by mouth as needed. 1 tablet daily     loratadine (CLARITIN) 10 MG tablet Take 10 mg by mouth daily as needed for allergies.     PARoxetine (PAXIL) 20 MG tablet Take 20 mg by  mouth daily.     ibuprofen (ADVIL,MOTRIN) 600 MG tablet  (Patient not taking: Reported on 11/19/2021)  0   letrozole (FEMARA) 2.5 MG tablet TAKE 1 TABLET BY MOUTH EVERY DAY (Patient not taking: Reported on 05/25/2023) 90 tablet 1   metoprolol succinate (TOPROL-XL) 50 MG 24 hr tablet Take 1 tablet by mouth daily.     naproxen (NAPROSYN) 500 MG tablet Take 500 mg by mouth 2 (two) times daily. (Patient not taking: Reported on 11/19/2021)     nystatin (MYCOSTATIN/NYSTOP) powder Apply 1 application topically 3 (three) times daily. (Patient not taking: Reported on 11/19/2021) 15 g 0   nystatin-triamcinolone ointment (MYCOLOG) APPLY TO AFFECTED AREA TWICE A DAY (Patient not taking: Reported on 11/19/2021) 30 g 0   No current facility-administered medications for this visit.    OBJECTIVE: Vitals:   05/25/23 0926  BP: 133/80  Pulse: 85  Temp: (!) 97.2 F (36.2 C)  SpO2: 98%  Body mass index is 40.56 kg/m.    ECOG FS:0 - Asymptomatic  General: Well-developed, well-nourished, no acute distress. Eyes: Pink conjunctiva, anicteric sclera. Lungs: Clear to auscultation bilaterally.  No audible wheezing or coughing Heart: Regular rate and rhythm.  Abdomen: Soft, nontender, nondistended.  Musculoskeletal: No edema, cyanosis, or clubbing. Neuro: Alert, answering all questions appropriately. Cranial nerves grossly intact. Skin: No rashes or petechiae noted. Psych: Normal affect. Breast: declined  LAB RESULTS: No labs on site today   STUDIES: 11/19/22- Bilateral mammogram CLINICAL DATA:  Screening.   EXAM: DIGITAL SCREENING BILATERAL MAMMOGRAM WITH TOMOSYNTHESIS AND CAD   TECHNIQUE: Bilateral screening digital craniocaudal and mediolateral oblique mammograms were obtained. Bilateral screening digital breast tomosynthesis was performed. The images were evaluated with computer-aided detection.   COMPARISON:  Previous exam(s).   ACR Breast Density Category b: There are scattered areas  of fibroglandular density.   FINDINGS: There are no findings suspicious for malignancy.   IMPRESSION: No mammographic evidence of malignancy. A result letter of this screening mammogram will be mailed directly to the patient.   RECOMMENDATION: Screening mammogram in one year. (Code:SM-B-01Y)   BI-RADS CATEGORY  1: Negative.     Electronically Signed   By: Edwin Cap M.D.   On: 11/20/2022 14:15  No results found.  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  Patient completed neoadjuvant chemotherapy with Adriamycin, Cytoxan, and Taxol on September 08, 2017.  She then underwent partial mastectomy on October 14, 2017.  Patient noted to have residual disease, therefore proceeded with adjuvant XRT which she completed on January 06, 2018.  Despite hot flashes, patient wishes to continue with letrozole and completed 5 years of treatment in June 2024.  Patient most recent mammogram on Nov 19, 2022 was reported as bi-rads category 1: negative. She will continue annual mammograms and we will transition to yearly visits. We reviewed symptoms that would be concerning for recurrence and warrant sooner evaluation.  Hot flashes: Worse since completing letrozole. Likely hormonal. Primarily nocturnal are most bothersome. I recommend trial gabapentin 300 mg at night. She'll reach out if symptoms not improved or not tolerating.  Osteopenia: Patient's most recent bone mineral density on 04/21/23 revealed T score of -0.9 which is stable. Plan to repeat in 2 years which can be done with PCP.  Back pain: Chronic and unchanged.  Follow-up with orthopedics as needed. Clinical trials enrolled:  [id number] - BWEL:  Randomized Phase III Trial Evaluating the Role of Weight Loss in Adjuvant Treatment of Overweight and Obese Women with Early Breast Cancer  [id number]: A randomized phase III double blinded placebo-controlled trial of aspirin as adjuvant therapy for HER2 negative breast cancer: the ABC trial  Disposition:  May 2025- mammogram 1 year- see me for breast cancer surveillance- [place]  Patient expressed understanding and was in agreement with this plan. She also understands that She can call clinic at any time with any questions, concerns, or complaints.    Cancer Staging  Primary cancer of upper outer quadrant of right female breast [facility]) Staging form: Breast, AJCC 8th Edition - Clinical stage from 03/27/2017: Stage IB (cT1c, cN1, cM0, G2, ER+, PR+, HER2-) - Signed by Jeralyn Ruths, MD on 03/27/2017 Histologic grading system: 3 grade  system Laterality: Right - Pathologic stage from 10/22/2017: No Stage Recommended (ypT1c, pN1a, cM0, G2, ER+, PR+, HER2-) - Signed by Jeralyn Ruths, MD on 10/22/2017 Stage prefix: Post-therapy Neoadjuvant therapy: Yes Histologic grading system: 3 grade system Laterality: Right   Alinda Dooms, NP   05/25/2023

## 2023-05-25 NOTE — Research (Signed)
A [id number] BWEL 5 Year Follow Up:    Ms. Moffat came in to the cancer center today for her scheduled research visit for the A011401 " BWEL" protocol. She denied having any problems / issues today. The patients height and weight were measured per protocol with her shoes off. Her height was 64.5 in and weight was 240.0 lbs and 240.7 lbs for the second measurement. She states her weight is up but she has been really trying to lose some weight these days. Patients hip measurements were 54 in and 53 in subsequently, and her waist measurements were 44 in and 44 in. The patient did remove her shirt and pants were down under measurement areas as required. This was performed in an office examination room. The patient completed her questionnaires without any difficulty, no assistance required. Consuello Masse, NP examined patient today and reviewed her mammogram results with her. She did complain of hot flashes that are worse than ever. Lauren explained this is due to the letrozole being completed, discussed rationale for gabapentin with the patient. Patient agreed to taking gabapentin in low doses to start with. 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 The patient was encouraged to call Lauren if the new medication doesn't help or she has any untoward side effects from it. The patient denied having any further questions and was thanked for taking the time to come in for her visit today with the research department. Patient encouraged to call at any time she has any questions or concerns.   Chriss Driver, RN 05/25/23 1:12 PM

## 2023-11-23 ENCOUNTER — Ambulatory Visit
Admission: RE | Admit: 2023-11-23 | Discharge: 2023-11-23 | Disposition: A | Discharge status: Home / Self Care | Payer: Medicare HMO | Source: Ambulatory Visit | Attending: Nurse Practitioner | Admitting: Nurse Practitioner

## 2023-11-23 DIAGNOSIS — Z1231 Encounter for screening mammogram for malignant neoplasm of breast: Secondary | ICD-10-CM | POA: Insufficient documentation

## 2023-11-23 DIAGNOSIS — C50411 Malignant neoplasm of upper-outer quadrant of right female breast: Secondary | ICD-10-CM | POA: Insufficient documentation

## 2024-05-24 ENCOUNTER — Inpatient Hospital Stay

## 2024-05-24 ENCOUNTER — Inpatient Hospital Stay: Payer: Medicare HMO | Attending: Nurse Practitioner | Admitting: Nurse Practitioner

## 2024-05-24 VITALS — BP 139/92 | HR 85 | Temp 97.8°F | Resp 16

## 2024-05-24 DIAGNOSIS — Z853 Personal history of malignant neoplasm of breast: Secondary | ICD-10-CM

## 2024-05-24 DIAGNOSIS — C50411 Malignant neoplasm of upper-outer quadrant of right female breast: Secondary | ICD-10-CM

## 2024-05-24 DIAGNOSIS — Z23 Encounter for immunization: Secondary | ICD-10-CM

## 2024-05-24 DIAGNOSIS — Z08 Encounter for follow-up examination after completed treatment for malignant neoplasm: Secondary | ICD-10-CM | POA: Diagnosis not present

## 2024-05-24 MED ORDER — INFLUENZA VIRUS VACC SPLIT PF (FLUZONE) 0.5 ML IM SUSY
0.5000 mL | PREFILLED_SYRINGE | Freq: Once | INTRAMUSCULAR | Status: AC
  Administered 2024-05-24: 0.5 mL via INTRAMUSCULAR
  Filled 2024-05-24: qty 0.5

## 2024-05-24 NOTE — Research (Signed)
 A [id number] BWEL 6 Year Follow Up:    Destiny Rogers came in to the cancer center today for her scheduled research visit for the A011401  BWEL protocol. She denied having any problems / issues today. The patients height and weight were measured per protocol with her shoes off. Her height was 64.5 in and weight was 248.7 lbs and 247.6 lbs for the second measurement. She states her weight is up but she has been really trying to lose some weight these days. Patients hip measurements were 54 in and 52 in subsequently, and her waist measurements were 49 in and 47 in. The patient did remove her shirt and pants were down under measurement areas as required. This was performed in an office examination room in private. The patient states that her son has moved back in with her as her balance has gotten worse lately. He's afraid she might fall. She does not currently use a cane or other walking assistance. The patient completed  the Follow Up and A011401 ST-1 QOL questionnaires without any difficulty, no assistance required. Tinnie Dawn, NP examined patient today and reviewed her mammogram results with her. The patient denied having any further questions and was thanked for taking the time to come in for her visit today with the research department. She is aware that she will be seen in one year for the protocol after her mammograms. Patient encouraged to call at any time she has any questions or concerns.   Reena Romans, RN 05/24/24 12:09 PM

## 2024-05-24 NOTE — Progress Notes (Signed)
 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  INTERVAL HISTORY: Patient returns to clinic today for routine 4-month evaluation. She has stopped letrozole  after 5 years. Her hot flashes worsened after stopping but not bothersome and have now mostly resolved.  She otherwise feels at baseline. She denies breast lumps, skin changes, or pain. No new bone pain. Appetite is good and she denies unintentional weight loss. No nausea, vomiting, constipation, or diarrhea. No vaginal bleeding.    REVIEW OF SYSTEMS:   Review of Systems  Constitutional:  Negative for fever, malaise/fatigue and weight loss.  Respiratory:  Negative for cough and shortness of breath.   Cardiovascular:  Negative for chest pain, palpitations and leg swelling.  Gastrointestinal:  Negative for abdominal pain, nausea and vomiting.  Genitourinary:  Negative for dysuria and flank pain.  Musculoskeletal:  Negative for back pain, falls and joint pain.  Skin:  Negative for itching and rash.  Neurological:  Positive for sensory change. Negative for tingling, focal weakness and weakness.  Psychiatric/Behavioral:  Negative for depression. The patient is not nervous/anxious and does not have insomnia.   As per HPI. Otherwise, a complete review of systems is negative.   PAST MEDICAL HISTORY: Past Medical History:  Diagnosis Date   Anemia    Arthritis    Breast cancer (HCC) 2018   Right  breast cancer- [facility]   Dyspnea    SINCE STARTING CHEMO WITH EXERTION   GERD (gastroesophageal reflux disease)    NO MEDS   Heart murmur    DX IN TEENS-ASYMPTOMATIC   Hypertension    Migraines    H/O MIGRAINES   Personal history of chemotherapy 2018   finished 09/08/17   Personal history of radiation therapy    Rt. breast   Thumb fracture    LEFT-WEARING BRACE    PAST SURGICAL HISTORY: Past Surgical History:  Procedure Laterality Date   ABLATION  2006   BREAST BIOPSY Right 03/19/2017    [facility] and mets in lymph node   BREAST LUMPECTOMY Right 10/14/2017   breast ca rad and chemo    CESAREAN SECTION     FRACTURE SURGERY Left 2013   left hand, thumb (screws and titanium)   INCISION AND DRAINAGE ABSCESS Right 04/26/2018   Procedure: INCISION AND DRAINAGE ABSCESS;  Surgeon: Rodolph Romano, MD;  Location: [facility];  Service: General;  Laterality: Right;   PARTIAL MASTECTOMY WITH NEEDLE LOCALIZATION Right 10/14/2017   Procedure: PARTIAL MASTECTOMY WITH NEEDLE LOCALIZATION;  Surgeon: Rodolph Romano, MD;  Location: [facility];  Service: General;  Laterality: Right;   PILONIDAL CYST EXCISION  1980'S   PORT-A-CATH REMOVAL N/A 04/27/2017   Procedure: REMOVAL OF PORT A CATH & insertion PORT A CATH LEFT INTERNAL JUGULAR;  Surgeon: Rodolph Romano, MD;  Location: [facility];  Service: General;  Laterality: N/A;   PORTACATH PLACEMENT N/A 04/15/2017   Procedure: INSERTION PORT-A-CATH;  Surgeon: Rodolph Romano, MD;  Location: [facility];  Service: General;  Laterality: N/A;   SENTINEL NODE BIOPSY Right 10/14/2017   Procedure: SENTINEL NODE  BIOPSY;  Surgeon: Rodolph Romano, MD;  Location: [facility];  Service: General;  Laterality: Right;    FAMILY HISTORY: Family History  Problem Relation Age of Onset   Breast cancer Maternal Grandmother    Hypertension Maternal Grandmother    Angina Mother    Lung cancer Father    Diabetes Father    Prostate cancer Maternal Uncle     Dementia Maternal Grandfather    Congestive Heart Failure Maternal Grandfather    Heart disease Paternal Grandmother     ADVANCED DIRECTIVES (Y/N):  N  HEALTH MAINTENANCE: Social History   Tobacco Use   Smoking status: Former    Current packs/day: 0.00    Average packs/day: 2.0 packs/day for 18.0 years (36.0 ttl pk-yrs)    Types: Cigarettes    Start date: 05/02/1999    Quit date: 05/01/2017    Years since quitting: 7.0   Smokeless tobacco: Never   Tobacco comments:    quit 4 months ago  Vaping Use   Vaping status: Never Used  Substance Use Topics   Alcohol use: No   Drug use: No    Colonoscopy:  PAP:  Bone density:  Lipid panel:  Allergies  Allergen Reactions   Hydrocodone-Acetaminophen  Itching and Rash   Penicillins Swelling, Other (See Comments) and Rash    Throat swelling Has patient had a PCN reaction causing immediate rash, facial/tongue/throat swelling, SOB or lightheadedness with hypotension: Yes Has patient had a PCN reaction causing severe rash involving mucus membranes or skin necrosis: No Has patient had a PCN reaction that required hospitalization: No Has patient had a PCN reaction occurring within the last 10 years: No If all of the above answers are NO, then may proceed with Cephalosporin use.     Current Outpatient Medications  Medication Sig Dispense Refill   acetaminophen  (TYLENOL ) 500 MG tablet Take 1,000 mg by mouth every 8 (eight) hours as needed for mild pain.     allopurinol (ZYLOPRIM) 300 MG tablet Take 300 mg by mouth daily.     ALPRAZolam  (XANAX ) 0.25 MG tablet Take 0.25 mg by mouth daily as needed.     colchicine 0.6 MG tablet Take by mouth.     hydrochlorothiazide (HYDRODIURIL) 12.5 MG tablet Take 1 tablet by mouth as needed. 1 tablet daily     loratadine (CLARITIN) 10 MG tablet Take 10 mg by mouth daily as needed for allergies.     metoprolol succinate (TOPROL-XL) 50 MG 24 hr tablet Take 1 tablet by mouth daily.     naproxen  (NAPROSYN) 500 MG tablet Take 500 mg by mouth 2 (two) times daily.     PARoxetine (PAXIL) 20 MG tablet Take 20 mg by mouth daily.     ibuprofen (ADVIL,MOTRIN) 600 MG tablet  (Patient not taking: Reported on 05/24/2024)  0   nystatin -triamcinolone  ointment (MYCOLOG) APPLY TO AFFECTED AREA TWICE A DAY (Patient not taking: Reported on 05/24/2024) 30 g 0   No current facility-administered medications for this visit.    OBJECTIVE: Vitals:   05/24/24 1202  BP: (!) 139/92  Pulse: 85  Resp: 16  Temp: 97.8 F (36.6 C)  SpO2: 91%      There is no height or weight on file to calculate BMI.    ECOG FS:0 - Asymptomatic  General: Well-developed, well-nourished, no acute distress. Eyes: Pink conjunctiva, anicteric sclera. Lungs: Clear to auscultation bilaterally.  No audible wheezing or coughing Heart: Regular rate and rhythm.  Abdomen: Soft, nontender, nondistended.  Musculoskeletal: No edema,  cyanosis, or clubbing. Neuro: Alert, answering all questions appropriately. Cranial nerves grossly intact. Skin: No rashes or petechiae noted. Psych: Normal affect. Breast: declined  LAB RESULTS: No labs on site today   STUDIES: Mammogram-3D screening mammogram bilateral breast 11/23/2023 CLINICAL DATA:  Screening.   EXAM: DIGITAL SCREENING BILATERAL MAMMOGRAM WITH TOMOSYNTHESIS AND CAD   TECHNIQUE: Bilateral screening digital craniocaudal and mediolateral oblique mammograms were obtained. Bilateral screening digital breast tomosynthesis was performed. The images were evaluated with computer-aided detection.   COMPARISON:  Previous exam(s).   ACR Breast Density Category b: There are scattered areas of fibroglandular density.   FINDINGS: There are no findings suspicious for malignancy.   IMPRESSION: No mammographic evidence of malignancy. A result letter of this screening mammogram will be mailed directly to the patient.   RECOMMENDATION: Screening mammogram in one year.  (Code:SM-B-01Y)   BI-RADS CATEGORY  1: Negative.     Electronically Signed   By: Alm Parkins M.D.   On: 11/25/2023 09:39   No results found.  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  Patient completed neoadjuvant chemotherapy with Adriamycin , Cytoxan , and Taxol  on September 08, 2017.  She then underwent partial mastectomy on October 14, 2017.  Patient noted to have residual disease, therefore proceeded with adjuvant XRT which she completed on January 06, 2018.  Completed 5 years of letrozole  in June 2024.  Her most recent mammogram was on Nov 23, 2023 and reported as BI-RADS Category 1: Negative.  She will continue annual mammograms and will continue annual surveillance visits.  We reviewed signs and symptoms that would be concerning for recurrence and warrant sooner evaluation. Hot flashes: Declined gabapentin  due to side effects.  Now resolved.   Osteopenia: Patient's most recent bone mineral density on 04/21/23 revealed T score of -0.9 which is stable. Plan to repeat in 2 years.  We will schedule for October 2026. Back pain: Chronic and unchanged.  Follow-up with orthopedics as needed. Clinical trials enrolled: - A011401 - BWEL: Randomized Phase III Trial Evaluating the Role of Weight Loss in Adjuvant Treatment of Overweight and Obese Women with Early Breast Cancer - [id number]: A randomized phase III double blinded placebo-controlled trial of aspirin  as adjuvant therapy for HER2 negative breast cancer: the ABC trial  Disposition:  May 2025- mammogram 1 year- see me for breast cancer surveillance- [place]  Patient expressed understanding and was in agreement with this plan. She also understands that She  can call clinic at any time with any questions, concerns, or complaints.    Cancer Staging  Primary cancer of upper outer quadrant of right female breast [facility]) Staging form: Breast, AJCC 8th Edition - Clinical stage from 03/27/2017: Stage IB (cT1c, cN1, cM0, G2, ER+, PR+, HER2-) - Signed by Jacobo Evalene PARAS, MD on 03/27/2017 Histologic grading system: 3 grade system Laterality: Right - Pathologic stage from 10/22/2017: ypT1c, ypN1a, cM0, G2, ER+, PR+, HER2- - Signed by Jacobo Evalene PARAS, MD on 10/22/2017 Stage prefix: Post-therapy Neoadjuvant therapy: Yes Histologic grading system: 3 grade system Laterality: Right   Destiny KANDICE Dawn, NP   05/24/2024

## 2025-05-23 ENCOUNTER — Inpatient Hospital Stay: Admitting: Nurse Practitioner
